# Patient Record
Sex: Female | Born: 1945 | Race: White | Hispanic: No | Smoking: Former smoker
Health system: Southern US, Community
[De-identification: ages and names within clinical notes are randomized; demographics above are authoritative.]

## PROBLEM LIST (undated history)

## (undated) DIAGNOSIS — F419 Anxiety disorder, unspecified: Secondary | ICD-10-CM

## (undated) DIAGNOSIS — R0602 Shortness of breath: Secondary | ICD-10-CM

## (undated) DIAGNOSIS — E559 Vitamin D deficiency, unspecified: Secondary | ICD-10-CM

## (undated) DIAGNOSIS — M1990 Unspecified osteoarthritis, unspecified site: Secondary | ICD-10-CM

## (undated) DIAGNOSIS — E669 Obesity, unspecified: Secondary | ICD-10-CM

## (undated) DIAGNOSIS — E039 Hypothyroidism, unspecified: Secondary | ICD-10-CM

## (undated) DIAGNOSIS — E739 Lactose intolerance, unspecified: Secondary | ICD-10-CM

## (undated) DIAGNOSIS — M549 Dorsalgia, unspecified: Secondary | ICD-10-CM

## (undated) DIAGNOSIS — A609 Anogenital herpesviral infection, unspecified: Secondary | ICD-10-CM

## (undated) DIAGNOSIS — M2550 Pain in unspecified joint: Secondary | ICD-10-CM

## (undated) DIAGNOSIS — M8430XA Stress fracture, unspecified site, initial encounter for fracture: Secondary | ICD-10-CM

## (undated) DIAGNOSIS — K829 Disease of gallbladder, unspecified: Secondary | ICD-10-CM

## (undated) DIAGNOSIS — R7303 Prediabetes: Secondary | ICD-10-CM

## (undated) DIAGNOSIS — E7800 Pure hypercholesterolemia, unspecified: Secondary | ICD-10-CM

## (undated) DIAGNOSIS — F32A Depression, unspecified: Secondary | ICD-10-CM

## (undated) DIAGNOSIS — R12 Heartburn: Secondary | ICD-10-CM

## (undated) DIAGNOSIS — I10 Essential (primary) hypertension: Secondary | ICD-10-CM

## (undated) DIAGNOSIS — L100 Pemphigus vulgaris: Secondary | ICD-10-CM

## (undated) DIAGNOSIS — C801 Malignant (primary) neoplasm, unspecified: Secondary | ICD-10-CM

## (undated) DIAGNOSIS — K589 Irritable bowel syndrome without diarrhea: Secondary | ICD-10-CM

## (undated) DIAGNOSIS — F329 Major depressive disorder, single episode, unspecified: Secondary | ICD-10-CM

## (undated) DIAGNOSIS — D649 Anemia, unspecified: Secondary | ICD-10-CM

## (undated) HISTORY — DX: Dorsalgia, unspecified: M54.9

## (undated) HISTORY — PX: OOPHORECTOMY: SHX86

## (undated) HISTORY — DX: Prediabetes: R73.03

## (undated) HISTORY — DX: Anogenital herpesviral infection, unspecified: A60.9

## (undated) HISTORY — DX: Pain in unspecified joint: M25.50

## (undated) HISTORY — DX: Depression, unspecified: F32.A

## (undated) HISTORY — DX: Shortness of breath: R06.02

## (undated) HISTORY — DX: Anemia, unspecified: D64.9

## (undated) HISTORY — DX: Unspecified osteoarthritis, unspecified site: M19.90

## (undated) HISTORY — DX: Malignant (primary) neoplasm, unspecified: C80.1

## (undated) HISTORY — DX: Major depressive disorder, single episode, unspecified: F32.9

## (undated) HISTORY — DX: Essential (primary) hypertension: I10

## (undated) HISTORY — DX: Obesity, unspecified: E66.9

## (undated) HISTORY — DX: Pemphigus vulgaris: L10.0

## (undated) HISTORY — PX: CATARACT EXTRACTION, BILATERAL: SHX1313

## (undated) HISTORY — DX: Stress fracture, unspecified site, initial encounter for fracture: M84.30XA

## (undated) HISTORY — PX: DILATION AND CURETTAGE OF UTERUS: SHX78

## (undated) HISTORY — DX: Lactose intolerance, unspecified: E73.9

## (undated) HISTORY — DX: Hypothyroidism, unspecified: E03.9

## (undated) HISTORY — DX: Heartburn: R12

## (undated) HISTORY — DX: Disease of gallbladder, unspecified: K82.9

## (undated) HISTORY — DX: Irritable bowel syndrome, unspecified: K58.9

## (undated) HISTORY — DX: Anxiety disorder, unspecified: F41.9

## (undated) HISTORY — DX: Pure hypercholesterolemia, unspecified: E78.00

## (undated) HISTORY — DX: Vitamin D deficiency, unspecified: E55.9

---
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
## 1974-02-16 HISTORY — PX: CHOLECYSTECTOMY: SHX55

## 1997-09-07 ENCOUNTER — Other Ambulatory Visit: Admission: RE | Admit: 1997-09-07 | Discharge: 1997-09-07 | Payer: Self-pay | Admitting: Obstetrics and Gynecology

## 1998-02-05 ENCOUNTER — Other Ambulatory Visit: Admission: RE | Admit: 1998-02-05 | Discharge: 1998-02-05 | Payer: Self-pay | Admitting: Obstetrics and Gynecology

## 1998-09-24 ENCOUNTER — Other Ambulatory Visit: Admission: RE | Admit: 1998-09-24 | Discharge: 1998-09-24 | Payer: Self-pay | Admitting: Obstetrics and Gynecology

## 1999-02-05 ENCOUNTER — Encounter (INDEPENDENT_AMBULATORY_CARE_PROVIDER_SITE_OTHER): Payer: Self-pay

## 1999-02-05 ENCOUNTER — Other Ambulatory Visit: Admission: RE | Admit: 1999-02-05 | Discharge: 1999-02-05 | Payer: Self-pay | Admitting: Obstetrics and Gynecology

## 1999-02-17 HISTORY — PX: ABDOMINAL HYSTERECTOMY: SHX81

## 1999-04-01 ENCOUNTER — Encounter (INDEPENDENT_AMBULATORY_CARE_PROVIDER_SITE_OTHER): Payer: Self-pay

## 1999-04-01 ENCOUNTER — Inpatient Hospital Stay (HOSPITAL_COMMUNITY): Admission: RE | Admit: 1999-04-01 | Discharge: 1999-04-03 | Payer: Self-pay | Admitting: Obstetrics and Gynecology

## 2000-04-30 ENCOUNTER — Other Ambulatory Visit: Admission: RE | Admit: 2000-04-30 | Discharge: 2000-04-30 | Payer: Self-pay | Admitting: Obstetrics and Gynecology

## 2001-05-02 ENCOUNTER — Other Ambulatory Visit: Admission: RE | Admit: 2001-05-02 | Discharge: 2001-05-02 | Payer: Self-pay | Admitting: Obstetrics and Gynecology

## 2002-05-05 ENCOUNTER — Other Ambulatory Visit: Admission: RE | Admit: 2002-05-05 | Discharge: 2002-05-05 | Payer: Self-pay | Admitting: Obstetrics and Gynecology

## 2003-05-24 ENCOUNTER — Other Ambulatory Visit: Admission: RE | Admit: 2003-05-24 | Discharge: 2003-05-24 | Payer: Self-pay | Admitting: Obstetrics and Gynecology

## 2004-01-15 ENCOUNTER — Ambulatory Visit: Payer: Self-pay | Admitting: Family Medicine

## 2004-05-26 ENCOUNTER — Other Ambulatory Visit: Admission: RE | Admit: 2004-05-26 | Discharge: 2004-05-26 | Payer: Self-pay | Admitting: Obstetrics and Gynecology

## 2005-02-19 ENCOUNTER — Ambulatory Visit: Payer: Self-pay | Admitting: Family Medicine

## 2005-06-09 ENCOUNTER — Other Ambulatory Visit: Admission: RE | Admit: 2005-06-09 | Discharge: 2005-06-09 | Payer: Self-pay | Admitting: Obstetrics and Gynecology

## 2006-06-29 ENCOUNTER — Other Ambulatory Visit: Admission: RE | Admit: 2006-06-29 | Discharge: 2006-06-29 | Payer: Self-pay | Admitting: Obstetrics and Gynecology

## 2006-07-20 ENCOUNTER — Ambulatory Visit: Payer: Self-pay | Admitting: Family Medicine

## 2007-06-13 ENCOUNTER — Encounter: Payer: Self-pay | Admitting: Family Medicine

## 2007-06-30 ENCOUNTER — Other Ambulatory Visit: Admission: RE | Admit: 2007-06-30 | Discharge: 2007-06-30 | Payer: Self-pay | Admitting: Obstetrics and Gynecology

## 2007-11-18 ENCOUNTER — Ambulatory Visit: Payer: Self-pay | Admitting: Family Medicine

## 2008-05-25 ENCOUNTER — Ambulatory Visit: Payer: Self-pay | Admitting: Obstetrics and Gynecology

## 2008-07-04 ENCOUNTER — Ambulatory Visit: Payer: Self-pay | Admitting: Obstetrics and Gynecology

## 2008-07-04 ENCOUNTER — Other Ambulatory Visit: Admission: RE | Admit: 2008-07-04 | Discharge: 2008-07-04 | Payer: Self-pay | Admitting: Obstetrics and Gynecology

## 2008-07-04 ENCOUNTER — Encounter: Payer: Self-pay | Admitting: Obstetrics and Gynecology

## 2008-09-04 ENCOUNTER — Encounter: Payer: Self-pay | Admitting: Family Medicine

## 2009-03-18 ENCOUNTER — Ambulatory Visit: Payer: Self-pay | Admitting: Obstetrics and Gynecology

## 2009-03-26 ENCOUNTER — Ambulatory Visit: Payer: Self-pay | Admitting: Family Medicine

## 2009-03-26 DIAGNOSIS — F341 Dysthymic disorder: Secondary | ICD-10-CM

## 2009-03-26 DIAGNOSIS — M62838 Other muscle spasm: Secondary | ICD-10-CM | POA: Insufficient documentation

## 2009-03-26 DIAGNOSIS — IMO0001 Reserved for inherently not codable concepts without codable children: Secondary | ICD-10-CM | POA: Insufficient documentation

## 2009-03-26 DIAGNOSIS — Z8719 Personal history of other diseases of the digestive system: Secondary | ICD-10-CM | POA: Insufficient documentation

## 2009-04-03 ENCOUNTER — Telehealth: Payer: Self-pay | Admitting: Family Medicine

## 2009-05-28 ENCOUNTER — Ambulatory Visit: Payer: Self-pay | Admitting: Family Medicine

## 2009-05-28 DIAGNOSIS — R109 Unspecified abdominal pain: Secondary | ICD-10-CM

## 2009-07-09 ENCOUNTER — Ambulatory Visit: Payer: Self-pay | Admitting: Obstetrics and Gynecology

## 2009-07-09 ENCOUNTER — Other Ambulatory Visit: Admission: RE | Admit: 2009-07-09 | Discharge: 2009-07-09 | Payer: Self-pay | Admitting: Obstetrics and Gynecology

## 2009-08-13 ENCOUNTER — Ambulatory Visit: Payer: Self-pay | Admitting: Obstetrics and Gynecology

## 2009-10-09 ENCOUNTER — Encounter: Payer: Self-pay | Admitting: Family Medicine

## 2010-03-20 NOTE — Assessment & Plan Note (Signed)
Summary: continued muscle pain/dm   Vital Signs:  Patient profile:   65 year old female Weight:      170 pounds O2 Sat:      97 % Temp:     97 degrees F oral Pulse rate:   94 / minute Pulse rhythm:   regular BP sitting:   142 / 78  Vitals Entered By: Lynann Beaver CMA (May 28, 2009 9:52 AM) CC: continued low abdominal pain Is Patient Diabetic? No Pain Assessment Patient in pain? yes     Location: abdomen   History of Present Illness: This 65 year old white female was seen in February for low abdominal pain which was thought to be muscle spasm but her lower spine and 2 Flexeril pain continues to be present in his diarrhea by standing and walking and also coughing aggravates ears is been no improvement As stated previously she had been seen by Her gynecologist as well as urologist and I can find no abnormalities to explain the Irritable bowel syndrome symptoms have improved with Levbid, do to Flexeril might have her sleepy she has not been able to take this as prescribed Anxiety depression and then much improved with Celexa alprazolam. The gynecologist has continued her Premarin              Current Medications (verified): 1)  Premarin 0.45 Mg Tabs (Estrogens Conjugated) .... Take 1 Tablet By Mouth Once A Day 2)  Levbid 0.375 Mg  Tb12 (Hyoscyamine Sulfate) .... Once Daily 3)  Mobic 7.5 Mg  Tabs (Meloxicam) .... As Needed  1 Once Daily For Inflammation 4)  Metamucil 0.52 Gm  Caps (Psyllium) .... Once Daily 5)  Celexa 20 Mg  Tabs (Citalopram Hydrobromide) .... Once Daily 6)  Alprazolam 0.25 Mg  Tabs (Alprazolam) .... As Needed 7)  Calcium Citrate With Vit D 630-400 .... Two Times A Day 8)  Vitamin D 1000 Unit Tabs (Cholecalciferol) .... Once Daily 9)  Lac-Hydrin Twelve 12 % Lotn (Ammonium Lactate) .... Use Daily 10)  Hyomax 0.125 Mg Tabs (Hyoscyamine Sulfate) .... As Needed Ibs 11)  Methocarbamol 500 Mg Tabs (Methocarbamol) .... As Needed Back 12)  Tylenol 325 Mg  Tabs (Acetaminophen) .... As Needed 13)  Flexeril 10 Mg Tabs (Cyclobenzaprine Hcl) .Marland Kitchen..  Midafternoon and Bedtime For Muscle Spasm 14)  Hydromet 5-1.5 Mg/70ml Syrp (Hydrocodone-Homatropine) .Marland Kitchen.. 1-2 Tsp Every 4-6 Hrs As Needed Cough  Allergies (verified): 1)  ! Codeine Sulfate (Codeine Sulfate) 2)  ! Sulfa 3)  ! Morphine 4)  ! * Diflucan 5)  ! * Nizerel  Review of Systems      See HPI  The patient denies anorexia, fever, weight loss, weight gain, vision loss, decreased hearing, hoarseness, chest pain, syncope, dyspnea on exertion, peripheral edema, prolonged cough, headaches, hemoptysis, abdominal pain, melena, hematochezia, severe indigestion/heartburn, hematuria, incontinence, genital sores, muscle weakness, suspicious skin lesions, transient blindness, difficulty walking, depression, unusual weight change, abnormal bleeding, enlarged lymph nodes, angioedema, breast masses, and testicular masses.    Physical Exam  General:  Well-developed,well-nourished,in no acute distress; alert,appropriate and cooperative throughout examination Lungs:  Normal respiratory effort, chest expands symmetrically. Lungs are clear to auscultation, no crackles or wheezes. Heart:  Normal rate and regular rhythm. S1 and S2 normal without gallop, murmur, click, rub or other extra sounds. Abdomen:  muscle spasm of the lower rectus muscles no masses palpable normal bowel sounds   Impression & Recommendations:  Problem # 1:  ABDOMINAL PAIN, LOWER (ICD-789.09) Assessment Deteriorated  Her updated medication list  for this problem includes:    Mobic 7.5 Mg Tabs (Meloxicam) .Marland Kitchen... As needed  1 once daily for inflammation    Methocarbamol 500 Mg Tabs (Methocarbamol) .Marland Kitchen... 1 qid for muscle spasm    Tylenol 325 Mg Tabs (Acetaminophen) .Marland Kitchen... As needed      Problem # 2:  MUSCLE SPASM (ICD-728.85) Assessment: Unchanged  Problem # 3:  POSTMENOPAUSAL ON HORMONE REPLACEMENT THERAPY (ICD-V07.4) Assessment:  Unchanged  Problem # 4:  ANXIETY DEPRESSION (ICD-300.4) Assessment: Improved  Problem # 5:  IRRITABLE BOWEL SYNDROME, HX OF (ICD-V12.79) Assessment: Improved  Complete Medication List: 1)  Premarin 0.45 Mg Tabs (Estrogens conjugated) .... Take 1 tablet by mouth once a day 2)  Levbid 0.375 Mg Tb12 (Hyoscyamine sulfate) .... Once daily 3)  Mobic 7.5 Mg Tabs (Meloxicam) .... As needed  1 once daily for inflammation 4)  Metamucil 0.52 Gm Caps (Psyllium) .... Once daily 5)  Celexa 20 Mg Tabs (Citalopram hydrobromide) .... Once daily 6)  Alprazolam 0.25 Mg Tabs (Alprazolam) .... As needed 7)  Calcium Citrate With Vit D 630-400  .... Two times a day 8)  Vitamin D 1000 Unit Tabs (Cholecalciferol) .... Once daily 9)  Lac-hydrin Twelve 12 % Lotn (Ammonium lactate) .... Use daily 10)  Hyomax 0.125 Mg Tabs (Hyoscyamine sulfate) .... As needed ibs 11)  Methocarbamol 500 Mg Tabs (Methocarbamol) .Marland Kitchen.. 1 qid for muscle spasm 12)  Tylenol 325 Mg Tabs (Acetaminophen) .... As needed 13)  Flexeril 10 Mg Tabs (Cyclobenzaprine hcl) .Marland Kitchen..  midafternoon and bedtime for muscle spasm 14)  Hydromet 5-1.5 Mg/63ml Syrp (Hydrocodone-homatropine) .Marland Kitchen.. 1-2 tsp every 4-6 hrs as needed cough  Patient Instructions: 1)  it is deferred difficult to explain your suprapubic pain but does appear to be muscle spasm since she has not responded to treatment I am going to change her muscle accident however I am going to schedule a CT scan of the abdomen Prescriptions: METHOCARBAMOL 500 MG TABS (METHOCARBAMOL) 1 qid for muscle spasm  #120 x 11   Entered and Authorized by:   Judithann Sheen MD   Signed by:   Judithann Sheen MD on 05/28/2009   Method used:   Electronically to        [facility]* (retail)       [address]       [place], [place]  [address]       Ph: [phone]       Fax: [phone]   RxID:   [id number]

## 2010-03-20 NOTE — Assessment & Plan Note (Signed)
Summary: PULLED MUSCLE LOWER ABD/PS   Vital Signs:  Patient profile:   65 year old female Weight:      175 pounds O2 Sat:      99 % on Room air Temp:     98.1 degrees F oral Pulse rate:   66 / minute BP sitting:   120 / 86  (left arm)  Vitals Entered By: Gladis Riffle, RN (March 26, 2009 9:19 AM)  O2 Flow:  Room air CC: "pulled muscle" of lower abdomen x 4 weeks Is Patient Diabetic? No Comments onset was slow, but is getting worse--ran out ou methocarbamol so used husband's skelaxin 800mg  1/2   History of Present Illness: this 65 year old white married female is complaining of pain over the abdomen pending to the suprapubic region. It is aggravated by walking standing or lifting she has seen the urologist, note positive findings nor diagnosis as well as examined and seen by Dr. Chales Abrahams leg and her gynecologist urinalysis has been done x2 and were negative pain is not related to bowel movements, not related to eating little and as stated above. The pain has been helped somewhat with Tylenol at times could not completely patient has history of irritable bowel syndrome and controlled with, Levbid but this pain is not same as when she has IBS the pain has been present for 4-5 weeks No other complaints, should mention no fever associated with the pain  Preventive Screening-Counseling & Management  Alcohol-Tobacco     Smoking Status: quit > 6 months  Current Medications (verified): 1)  Premarin 0.45 Mg Tabs (Estrogens Conjugated) .... Take 1 Tablet By Mouth Once A Day 2)  Levbid 0.375 Mg  Tb12 (Hyoscyamine Sulfate) .... Once Daily 3)  Mobic 7.5 Mg  Tabs (Meloxicam) .... As Needed 4)  Metamucil 0.52 Gm  Caps (Psyllium) .... Once Daily 5)  Celexa 20 Mg  Tabs (Citalopram Hydrobromide) .... Once Daily 6)  Alprazolam 0.25 Mg  Tabs (Alprazolam) .... As Needed 7)  Calcium Citrate With Vit D 630-400 .... Two Times A Day 8)  Vitamin D 1000 Unit Tabs (Cholecalciferol) .... Once Daily 9)   Lac-Hydrin Twelve 12 % Lotn (Ammonium Lactate) .... Use Daily 10)  Hyomax 0.125 Mg Tabs (Hyoscyamine Sulfate) .... As Needed Ibs 11)  Methocarbamol 500 Mg Tabs (Methocarbamol) .... As Needed Back 12)  Tylenol 325 Mg Tabs (Acetaminophen) .... As Needed  Allergies: 1)  ! Codeine Sulfate (Codeine Sulfate) 2)  ! Sulfa 3)  ! Morphine 4)  ! * Diflucan 5)  ! Riki Sheer  Past History:  Past Surgical History: Last updated: 10/25/2006 Cholecystectomy  1976 Hysterectomy  2001 Tonsillectomy, adenoidectomy  1954  Risk Factors: Smoking Status: quit > 6 months (03/26/2009)  Social History: Smoking Status:  quit > 6 months  Review of Systems  The patient denies anorexia, fever, weight loss, weight gain, vision loss, decreased hearing, hoarseness, chest pain, syncope, dyspnea on exertion, peripheral edema, prolonged cough, headaches, hemoptysis, abdominal pain, melena, hematochezia, severe indigestion/heartburn, hematuria, incontinence, genital sores, muscle weakness, suspicious skin lesions, transient blindness, difficulty walking, depression, unusual weight change, abnormal bleeding, enlarged lymph nodes, angioedema, breast masses, and testicular masses.    Physical Exam  General:  Well-developed,well-nourished,in no acute distress; alert,appropriate and cooperative throughout examination Lungs:  Normal respiratory effort, chest expands symmetrically. Lungs are clear to auscultation, no crackles or wheezes. Heart:  Normal rate and regular rhythm. S1 and S2 normal without gallop, murmur, click, rub or other extra sounds. Abdomen:  muscle spasm  of the rectus muscles bilaterally greater on the left and very tender on insertion of the pubis, bowel sounds are normal no masses felt liver spleen kidneys are normal in size and shape and not enlarged, bowel sounds normal Rectal:  none exam   Impression & Recommendations:  Problem # 1:  MYOSITIS (ICD-729.1) Assessment New  Her updated medication  list for this problem includes:    Mobic 7.5 Mg Tabs (Meloxicam) .Marland Kitchen... As needed  1 once daily for inflammation    Methocarbamol 500 Mg Tabs (Methocarbamol) .Marland Kitchen... As needed back    Tylenol 325 Mg Tabs (Acetaminophen) .Marland Kitchen... As needed    Flexeril 10 Mg Tabs (Cyclobenzaprine hcl) .Marland KitchenMarland KitchenMarland KitchenMarland Kitchen  midafternoon and bedtime for muscle spasm  Orders: Depo- Medrol 80mg  (J1040) Admin of Therapeutic Inj  intramuscular or subcutaneous (95621)  Problem # 2:  MUSCLE SPASM (ICD-728.85) Assessment: New  Flexeril 10 mg t.i.d.  Orders: Prescription Created Electronically [phone])  Problem # 3:  POSTMENOPAUSAL ON HORMONE REPLACEMENT THERAPY (ICD-V07.4) Assessment: Improved Premarin O. 0.5 mg q. day  Problem # 4:  ANXIETY DEPRESSION (ICD-300.4) Assessment: Improved Celexa 20 mg q. day also alprazolam oh 0.25 mg t.i.d. as needed  Problem # 5:  IRRITABLE BOWEL SYNDROME, HX OF (ICD-V12.79) Assessment: Improved  Complete Medication List: 1)  Premarin 0.45 Mg Tabs (Estrogens conjugated) .... Take 1 tablet by mouth once a day 2)  Levbid 0.375 Mg Tb12 (Hyoscyamine sulfate) .... Once daily 3)  Mobic 7.5 Mg Tabs (Meloxicam) .... As needed  1 once daily for inflammation 4)  Metamucil 0.52 Gm Caps (Psyllium) .... Once daily 5)  Celexa 20 Mg Tabs (Citalopram hydrobromide) .... Once daily 6)  Alprazolam 0.25 Mg Tabs (Alprazolam) .... As needed 7)  Calcium Citrate With Vit D 630-400  .... Two times a day 8)  Vitamin D 1000 Unit Tabs (Cholecalciferol) .... Once daily 9)  Lac-hydrin Twelve 12 % Lotn (Ammonium lactate) .... Use daily 10)  Hyomax 0.125 Mg Tabs (Hyoscyamine sulfate) .... As needed ibs 11)  Methocarbamol 500 Mg Tabs (Methocarbamol) .... As needed back 12)  Tylenol 325 Mg Tabs (Acetaminophen) .... As needed 13)  Flexeril 10 Mg Tabs (Cyclobenzaprine hcl) .Marland Kitchen..  midafternoon and bedtime for muscle spasm  Patient Instructions: 1)  Myositis and spasm of rectus muscles of abdomen, left greater than  rt 2)  Flexeril as muscle relaxant 3)  Mobic and depomedrol as antiinflammatory medicines 4)  Apply heat 20-30 mintes when possible 5)  Avoid exercises in gym that cause pain 6)  No lifting heavy objects 7)  When getting into car, sit down and swing your legs and feet around to enter Prescriptions: MOBIC 7.5 MG  TABS (MELOXICAM) as needed  1 once daily for inflammation  #30 x 11   Entered and Authorized by:   Judithann Sheen MD   Signed by:   Judithann Sheen MD on 03/26/2009   Method used:   Electronically to        [facility]* (retail)       [address]       [place], [place]  [address]       Ph: [phone]       Fax: [phone]   RxID:   [id number] FLEXERIL 10 MG TABS (CYCLOBENZAPRINE HCL)  midafternoon and bedtime for muscle spasm  #90 x 5   Entered and Authorized by:   Judithann Sheen MD   Signed by:   Valarie Merino  Montez Hageman MD on 03/26/2009   Method used:   Electronically to        [facility]* (retail)       [address]       [place], [place]  [id number]       Ph: [phone]       Fax: [phone]   RxID:   [id number]    Medication Administration  Injection # 1:    Medication: Depo- Medrol 80mg     Diagnosis: MYOSITIS (ICD-729.1)    Route: IM    Site: RUOQ gluteus    Exp Date: 12/18/2011    Lot #: [id number]    Mfr: Pharmacia    Comments: 120mg  given    Patient tolerated injection without complications    Given by: Gladis Riffle, RN (March 26, 2009 11:28 AM)  Orders Added: 1)  Depo- Medrol 80mg  [J1040] 2)  Admin of Therapeutic Inj  intramuscular or subcutaneous [96372] 3)  Prescription Created Electronically [G8553] 4)  Est. Patient Level IV [66440]

## 2010-03-20 NOTE — Letter (Signed)
[facility]: [facility]   Imported By: Maryln Gottron 11/05/2009 12:12:01  _____________________________________________________________________  External Attachment:    Type:   Image     Comment:   External Document

## 2010-03-20 NOTE — Progress Notes (Signed)
Summary: cough  Phone Note Call from Patient   Caller: Patient Call For: Brianna Sheen MD Summary of Call: Pt is calling to ask Dr. Scotty Court for RX for dry cough.  Is taking Mucinex.  Cannot take Codeine. [place] [phone] No fever. Initial call taken by: Lynann Beaver CMA,  April 03, 2009 8:36 AM  Follow-up for Phone Call        called by dr Scotty Court  and hydrocodone homatripine called in  Follow-up by: Pura Spice, RN,  April 03, 2009 9:48 AM    New/Updated Medications: HYDROMET 5-1.5 MG/5ML SYRP (HYDROCODONE-HOMATROPINE) 1-2 tsp every 4-6 hrs as needed cough Prescriptions: HYDROMET 5-1.5 MG/5ML SYRP (HYDROCODONE-HOMATROPINE) 1-2 tsp every 4-6 hrs as needed cough  #240 x 0   Entered by:   Pura Spice, RN   Authorized by:   Brianna Sheen MD   Signed by:   Pura Spice, RN on 04/03/2009   Method used:   Telephoned to ...       [facility]* (retail)       [address]       [place], [place]  [address]       Ph: [phone]       Fax: [phone]   RxID:   [id number]

## 2010-05-13 ENCOUNTER — Other Ambulatory Visit: Payer: Self-pay | Admitting: Family Medicine

## 2010-07-22 ENCOUNTER — Encounter: Payer: Self-pay | Admitting: Obstetrics and Gynecology

## 2010-07-30 ENCOUNTER — Other Ambulatory Visit (HOSPITAL_COMMUNITY)
Admission: RE | Admit: 2010-07-30 | Discharge: 2010-07-30 | Disposition: A | Discharge status: Home / Self Care | Payer: Commercial Managed Care - PPO | Source: Ambulatory Visit | Attending: Obstetrics and Gynecology | Admitting: Obstetrics and Gynecology

## 2010-07-30 ENCOUNTER — Other Ambulatory Visit: Payer: Self-pay | Admitting: Obstetrics and Gynecology

## 2010-07-30 ENCOUNTER — Encounter (INDEPENDENT_AMBULATORY_CARE_PROVIDER_SITE_OTHER): Payer: Commercial Managed Care - PPO | Admitting: Obstetrics and Gynecology

## 2010-07-30 DIAGNOSIS — Z833 Family history of diabetes mellitus: Secondary | ICD-10-CM

## 2010-07-30 DIAGNOSIS — Z01419 Encounter for gynecological examination (general) (routine) without abnormal findings: Secondary | ICD-10-CM

## 2010-07-30 DIAGNOSIS — Z13220 Encounter for screening for lipoid disorders: Secondary | ICD-10-CM

## 2010-07-30 DIAGNOSIS — Z124 Encounter for screening for malignant neoplasm of cervix: Secondary | ICD-10-CM | POA: Insufficient documentation

## 2010-12-15 ENCOUNTER — Ambulatory Visit (INDEPENDENT_AMBULATORY_CARE_PROVIDER_SITE_OTHER): Payer: Commercial Managed Care - PPO

## 2010-12-15 DIAGNOSIS — Z23 Encounter for immunization: Secondary | ICD-10-CM

## 2011-02-20 ENCOUNTER — Ambulatory Visit: Payer: Commercial Managed Care - PPO | Admitting: Internal Medicine

## 2011-04-02 HISTORY — PX: HAND SURGERY: SHX662

## 2011-05-27 ENCOUNTER — Ambulatory Visit (INDEPENDENT_AMBULATORY_CARE_PROVIDER_SITE_OTHER): Payer: Commercial Managed Care - PPO | Admitting: Internal Medicine

## 2011-05-27 ENCOUNTER — Encounter: Payer: Self-pay | Admitting: Internal Medicine

## 2011-05-27 VITALS — BP 120/80 | HR 70 | Temp 98.3°F | Resp 18 | Ht 63.0 in | Wt 174.0 lb

## 2011-05-27 DIAGNOSIS — F341 Dysthymic disorder: Secondary | ICD-10-CM

## 2011-05-27 DIAGNOSIS — Z0000 Encounter for general adult medical examination without abnormal findings: Secondary | ICD-10-CM

## 2011-05-27 DIAGNOSIS — Z8719 Personal history of other diseases of the digestive system: Secondary | ICD-10-CM

## 2011-05-27 LAB — CBC WITH DIFFERENTIAL/PLATELET
Basophils Relative: 1.1 % (ref 0.0–3.0)
Eosinophils Relative: 4.1 % (ref 0.0–5.0)
Hemoglobin: 12.8 g/dL (ref 12.0–15.0)
Lymphocytes Relative: 30.4 % (ref 12.0–46.0)
MCHC: 32 g/dL (ref 30.0–36.0)
Monocytes Relative: 8.3 % (ref 3.0–12.0)
Neutro Abs: 3.5 10*3/uL (ref 1.4–7.7)
RBC: 4.31 Mil/uL (ref 3.87–5.11)
WBC: 6.2 10*3/uL (ref 4.5–10.5)

## 2011-05-27 LAB — COMPREHENSIVE METABOLIC PANEL
AST: 18 U/L (ref 0–37)
BUN: 15 mg/dL (ref 6–23)
Calcium: 8.7 mg/dL (ref 8.4–10.5)
Chloride: 107 mEq/L (ref 96–112)
Creatinine, Ser: 0.6 mg/dL (ref 0.4–1.2)

## 2011-05-27 LAB — TSH: TSH: 3.14 u[IU]/mL (ref 0.35–5.50)

## 2011-05-27 LAB — LIPID PANEL
Cholesterol: 217 mg/dL — ABNORMAL HIGH (ref 0–200)
HDL: 80.9 mg/dL (ref >39.00)
VLDL: 14 mg/dL (ref 0.0–40.0)

## 2011-05-27 MED ORDER — ALPRAZOLAM 0.25 MG PO TABS: 0.2500 mg | ORAL_TABLET | Freq: Every evening | ORAL | Status: DC | PRN

## 2011-05-27 MED ORDER — CITALOPRAM HYDROBROMIDE 20 MG PO TABS: 20.0000 mg | ORAL_TABLET | Freq: Every day | ORAL | Status: DC

## 2011-05-27 MED ORDER — MELOXICAM 7.5 MG PO TABS: 7.5000 mg | ORAL_TABLET | Freq: Every day | ORAL | Status: DC

## 2011-05-27 NOTE — Patient Instructions (Signed)
It is important that you exercise regularly, at least 20 minutes 3 to 4 times per week.  If you develop chest pain or shortness of breath seek  medical attention.  Limit your sodium (Salt) intake  Call or return to clinic prn if these symptoms worsen or fail to improve as anticipated.  

## 2011-05-27 NOTE — Progress Notes (Signed)
  Subjective:    Patient ID: Brianna Mack, female    DOB: 1945-05-18, 66 y.o.   MRN: [id number]  HPI  66 year old patient who is seen today for a health maintenance examination. She is followed by gynecology. She is a history of IBS and is also followed by Dr. Kinnie Scales. She is scheduled for a followup colonoscopy in about one year. Her IBS has been fairly stable. She does have a history of mild anxiety depression which has been well-controlled with citalopram. She also takes when necessary alprazolam. No concerns or complaints. No recent lab    Review of Systems  Constitutional: Negative.   HENT: Negative for hearing loss, congestion, sore throat, rhinorrhea, dental problem, sinus pressure and tinnitus.   Eyes: Negative for pain, discharge and visual disturbance.  Respiratory: Negative for cough and shortness of breath.   Cardiovascular: Negative for chest pain, palpitations and leg swelling.  Gastrointestinal: Negative for nausea, vomiting, abdominal pain, diarrhea, constipation, blood in stool and abdominal distention.  Genitourinary: Negative for dysuria, urgency, frequency, hematuria, flank pain, vaginal bleeding, vaginal discharge, difficulty urinating, vaginal pain and pelvic pain.  Musculoskeletal: Negative for joint swelling, arthralgias and gait problem.  Skin: Negative for rash.  Neurological: Negative for dizziness, syncope, speech difficulty, weakness, numbness and headaches.  Hematological: Negative for adenopathy.  Psychiatric/Behavioral: Negative for behavioral problems, dysphoric mood and agitation. The patient is not nervous/anxious.        Objective:   Physical Exam  Constitutional: She is oriented to person, place, and time. She appears well-developed and well-nourished.  HENT:  Head: Normocephalic.  Right Ear: External ear normal.  Left Ear: External ear normal.  Mouth/Throat: Oropharynx is clear and moist.  Eyes: Conjunctivae and EOM are normal. Pupils are equal,  round, and reactive to light.  Neck: Normal range of motion. Neck supple. No thyromegaly present.  Cardiovascular: Normal rate, regular rhythm, normal heart sounds and intact distal pulses.   Pulmonary/Chest: Effort normal and breath sounds normal.  Abdominal: Soft. Bowel sounds are normal. She exhibits no mass. There is no tenderness.  Musculoskeletal: Normal range of motion.  Lymphadenopathy:    She has no cervical adenopathy.  Neurological: She is alert and oriented to person, place, and time.  Skin: Skin is warm and dry. No rash noted.  Psychiatric: She has a normal mood and affect. Her behavior is normal.          Assessment & Plan:   Preventive health exam History of IBS. Followup GI will refill Levbid. History of anxiety depression stable will refill citalopram  Screening lab Exercise modest weight loss encouraged

## 2011-08-04 ENCOUNTER — Ambulatory Visit (INDEPENDENT_AMBULATORY_CARE_PROVIDER_SITE_OTHER): Payer: Commercial Managed Care - PPO | Admitting: Obstetrics and Gynecology

## 2011-08-04 ENCOUNTER — Encounter: Payer: Self-pay | Admitting: Obstetrics and Gynecology

## 2011-08-04 VITALS — BP 128/80 | Ht 62.5 in | Wt 168.0 lb

## 2011-08-04 DIAGNOSIS — F341 Dysthymic disorder: Secondary | ICD-10-CM

## 2011-08-04 DIAGNOSIS — Z0000 Encounter for general adult medical examination without abnormal findings: Secondary | ICD-10-CM

## 2011-08-04 DIAGNOSIS — Z8719 Personal history of other diseases of the digestive system: Secondary | ICD-10-CM

## 2011-08-04 DIAGNOSIS — Z01419 Encounter for gynecological examination (general) (routine) without abnormal findings: Secondary | ICD-10-CM

## 2011-08-04 MED ORDER — CITALOPRAM HYDROBROMIDE 20 MG PO TABS: 20.0000 mg | ORAL_TABLET | Freq: Every day | ORAL | Status: DC

## 2011-08-04 MED ORDER — ALPRAZOLAM 0.25 MG PO TABS: 0.2500 mg | ORAL_TABLET | Freq: Every evening | ORAL | Status: DC | PRN

## 2011-08-04 MED ORDER — ESTROGENS CONJUGATED 0.45 MG PO TABS
0.4500 mg | ORAL_TABLET | Freq: Every day | ORAL | Status: DC
Start: 2011-08-04 — End: 2012-09-05

## 2011-08-04 NOTE — Patient Instructions (Signed)
Schedule mammogram.

## 2011-08-04 NOTE — Progress Notes (Signed)
Patient came to see me today for her annual GYN exam. She remains on oral Premarin with excellent results for vasomotor symptoms. She also remains on Celexa for depression with excellent results. She will occasionally take a Xanax for sleep. We are prescribing all the above. Her husband just had a stroke so she is under a lot of stress. She is having no vaginal bleeding. She is having no pelvic pain. She is due for a mammogram and she'll schedule it. She has had 2 normal bone densities. She has had a hysterectomy and has always had normal Pap smears.  HEENT: Within normal limits. Blanca present. Neck: No masses. Supraclavicular lymph nodes: Not enlarged. Breasts: Examined in both sitting and lying position. Symmetrical without skin changes or masses. Abdomen: Soft no masses guarding or rebound. No hernias. Pelvic: External within normal limits. BUS within normal limits. Vaginal examination shows good estrogen effect, no cystocele enterocele or rectocele. Cervix and uterus absent. Adnexa within normal limits. Rectovaginal confirmatory. Extremities within normal limits.  Assessment: #1. Vasomotor symptoms #2. Depression  Plan: Continue Premarin, Celexa, Xanax. Prescriptions written. Mammogram. Discussed transdermal estrogen to reduce risk of DVT and stroke. Patient declined.

## 2011-08-17 ENCOUNTER — Encounter: Payer: Self-pay | Admitting: Obstetrics and Gynecology

## 2011-11-02 ENCOUNTER — Telehealth: Payer: Self-pay | Admitting: *Deleted

## 2011-11-02 NOTE — Telephone Encounter (Signed)
Dr Wynn Banker ordered an [facility] for electrical stimulus for Brianna Mack's shoulder. She would like an rx for this sent to [facility]. States it is an "EMTI NP NMES unit for tissue atrophy"(?)  She was told the icd 9 code is 728.2

## 2011-11-02 NOTE — Telephone Encounter (Signed)
Error. Call is about her husband Everlean Alstrom by Fannie Knee and entered message in her chart.

## 2011-11-19 ENCOUNTER — Ambulatory Visit (INDEPENDENT_AMBULATORY_CARE_PROVIDER_SITE_OTHER): Payer: Medicare Other | Admitting: Internal Medicine

## 2011-11-19 DIAGNOSIS — Z23 Encounter for immunization: Secondary | ICD-10-CM

## 2011-11-20 ENCOUNTER — Telehealth: Payer: Self-pay | Admitting: *Deleted

## 2011-11-20 NOTE — Telephone Encounter (Signed)
(  pt aware you out of the office) Pt is currently taking premarin 0.45, pt insurance has changed to medicare and this medication will be $88.00. Pt asked if she could have another medication that maybe cheaper? Please advise

## 2011-11-20 NOTE — Telephone Encounter (Signed)
Estradiol .5 mgm daily.

## 2011-11-23 MED ORDER — ESTRADIOL 0.5 MG PO TABS: 0.5000 mg | ORAL_TABLET | Freq: Every day | ORAL | Status: DC

## 2011-11-23 NOTE — Addendum Note (Signed)
Addended by: Aura Camps on: 11/23/2011 08:35 AM   Modules accepted: Orders

## 2011-11-23 NOTE — Telephone Encounter (Signed)
Left the below on pt voicemail, rx sent 

## 2011-12-28 ENCOUNTER — Other Ambulatory Visit: Payer: Self-pay | Admitting: *Deleted

## 2011-12-28 ENCOUNTER — Encounter: Payer: Self-pay | Admitting: *Deleted

## 2011-12-28 NOTE — Progress Notes (Signed)
Patient ID: Brianna Mack, female   DOB: 30-Apr-1945, 66 y.o.   MRN: [id number] Pt estradiol 0.5 mg tablet approved through 12/24/12. Pharmacy informed as well.

## 2012-01-05 ENCOUNTER — Ambulatory Visit (INDEPENDENT_AMBULATORY_CARE_PROVIDER_SITE_OTHER): Payer: Medicare Other | Admitting: Internal Medicine

## 2012-01-05 ENCOUNTER — Encounter: Payer: Self-pay | Admitting: Internal Medicine

## 2012-01-05 VITALS — BP 122/80 | HR 64 | Temp 98.0°F | Resp 16 | Wt 168.0 lb

## 2012-01-05 DIAGNOSIS — J340 Abscess, furuncle and carbuncle of nose: Secondary | ICD-10-CM

## 2012-01-05 DIAGNOSIS — J3489 Other specified disorders of nose and nasal sinuses: Secondary | ICD-10-CM

## 2012-01-05 MED ORDER — DOXYCYCLINE HYCLATE 100 MG PO TABS: 100.0000 mg | ORAL_TABLET | Freq: Two times a day (BID) | ORAL | Status: DC

## 2012-01-05 NOTE — Patient Instructions (Signed)
Take your antibiotic as prescribed until ALL of it is gone, but stop if you develop a rash, swelling, or any side effects of the medication.  Contact our office as soon as possible if  there are side effects of the medication.  Warm compresses to the left nasal area 3-4 times daily

## 2012-01-05 NOTE — Progress Notes (Signed)
Subjective:    Patient ID: Brianna Mack, female    DOB: 1945-08-02, 66 y.o.   MRN: [id number]  HPI  66 year old patient who presents with a chief complaint of pain and swelling involving the left nasal area. She states that has typical for this time a year she has had some dryness involving the nasal mucosa. There is been some irritation and occasional bleeding from the left nares. Over the past few days she has noted some soft tissue swelling and pain involving left-sided her nose. She has difficulty with a number of antibiotics including cephalexin and Cipro. She also is allergic to sulfa antibiotics  Past Medical History  Diagnosis Date  . Endometriosis   . Fibroid   . Depression   . Anxiety   . IBS (irritable bowel syndrome)   . Arthritis     History   Social History  . Marital Status: Married    Spouse Name: N/A    Number of Children: N/A  . Years of Education: N/A   Occupational History  . Not on file.   Social History Main Topics  . Smoking status: Former Smoker    Quit date: 08/03/1977  . Smokeless tobacco: Never Used  . Alcohol Use: Yes     Comment: occasionally  . Drug Use: No  . Sexually Active: Not Currently   Other Topics Concern  . Not on file   Social History Narrative  . No narrative on file    Past Surgical History  Procedure Date  . Abdominal hysterectomy 2001    BSO ; SLING PROCEDURE  . Cholecystectomy 1976  . Tonsillectomy 1954  . Dilation and curettage of uterus   . Hand surgery 04-02-2011    right    Family History  Problem Relation Age of Onset  . Diabetes Mother   . Hypertension Mother   . Cancer Mother     Eda Keys  . COPD Father     Allergies  Allergen Reactions  . Cephalexin Diarrhea  . Codeine Sulfate     REACTION: nausea, vomiting  . Fluconazole     REACTION: swelling and itching  . Morphine     REACTION: nausea, itching  . Sulfonamide Derivatives     REACTION: rash    Current Outpatient Prescriptions on  File Prior to Visit  Medication Sig Dispense Refill  . Acetaminophen (TYLENOL ARTHRITIS PAIN PO) Take by mouth as needed.        . ALPRAZolam (XANAX) 0.25 MG tablet Take 1 tablet (0.25 mg total) by mouth at bedtime as needed.  30 tablet  3  . Calcium Carbonate-Vitamin D (CALCIUM + D PO) Take by mouth.        . citalopram (CELEXA) 20 MG tablet Take 1 tablet (20 mg total) by mouth daily.  30 tablet  12  . CycloSPORINE (RESTASIS OP) Apply to eye daily.      Marland Kitchen estradiol (ESTRACE) 0.5 MG tablet Take 1 tablet (0.5 mg total) by mouth daily.  30 tablet  11  . estrogens, conjugated, (PREMARIN) 0.45 MG tablet Take 1 tablet (0.45 mg total) by mouth daily.  30 tablet  12  . hyoscyamine (LEVBID) 0.375 MG 12 hr tablet Take 0.375 mg by mouth daily.       . Lactobacillus (ACIDOPHILUS PO) Take by mouth daily.      . Omega-3 Fatty Acids (FISH OIL PO) Take by mouth daily.      . meloxicam (MOBIC) 7.5 MG tablet Take 1 tablet (7.5 mg  total) by mouth daily.  90 tablet  3    BP 122/80  Pulse 64  Temp 98 F (36.7 C) (Oral)  Resp 16  Wt 168 lb (76.204 kg)       Review of Systems  Constitutional: Negative.   HENT: Negative for hearing loss, congestion, sore throat, rhinorrhea, dental problem, sinus pressure and tinnitus.   Eyes: Negative for pain, discharge and visual disturbance.  Respiratory: Negative for cough and shortness of breath.   Cardiovascular: Negative for chest pain, palpitations and leg swelling.  Gastrointestinal: Negative for nausea, vomiting, abdominal pain, diarrhea, constipation, blood in stool and abdominal distention.  Genitourinary: Negative for dysuria, urgency, frequency, hematuria, flank pain, vaginal bleeding, vaginal discharge, difficulty urinating, vaginal pain and pelvic pain.  Musculoskeletal: Negative for joint swelling, arthralgias and gait problem.  Skin: Positive for rash.  Neurological: Negative for dizziness, syncope, speech difficulty, weakness, numbness and headaches.   Hematological: Negative for adenopathy.  Psychiatric/Behavioral: Negative for behavioral problems, dysphoric mood and agitation. The patient is not nervous/anxious.        Objective:   Physical Exam  Constitutional: She appears well-developed and well-nourished.  HENT:  Right Ear: External ear normal.  Left Ear: External ear normal.  Mouth/Throat: Oropharynx is clear and moist.       The left the nasal area and maxillary region of the face were slightly tender with soft tissue swelling and mild erythema  Neck: Normal range of motion.          Assessment & Plan:   Early cellulitis nose. Will treat with warm compresses and doxycycline. Will call to is any clinical deterioration. Will treat with probiotics prophylactically.

## 2012-01-07 ENCOUNTER — Other Ambulatory Visit: Payer: Self-pay | Admitting: Dermatology

## 2012-01-07 ENCOUNTER — Telehealth: Payer: Self-pay | Admitting: Internal Medicine

## 2012-01-07 MED ORDER — DOXYCYCLINE HYCLATE 100 MG PO TABS: 100.0000 mg | ORAL_TABLET | Freq: Two times a day (BID) | ORAL | Status: DC

## 2012-01-07 NOTE — Telephone Encounter (Signed)
Caller: Irlene/Patient; Phone: [phone]; Reason for Call: Seen in the office Tuesday 11/19 and the prescription is not at the pharmacy, [facility] in [place]. In reviewing her chart, the script was sent electronically to [facility] in [place] as a mail order.  Would you please cancel the mail order and send to her local pharmacy?

## 2012-01-07 NOTE — Telephone Encounter (Signed)
Rx was sent to local pharmacy.   

## 2012-01-07 NOTE — Telephone Encounter (Signed)
Pt notified Rx was sent to local pharmacy.

## 2012-01-07 NOTE — Telephone Encounter (Signed)
I called patient to let her know that the script had been sent to her mail order pharmacy, [facility] but that we would get it corrected and sent to her local pharmacy, [facility].  She states that she has never used a [facility] and asked that it be removed please.  She only uses [facility].

## 2012-01-29 ENCOUNTER — Other Ambulatory Visit: Payer: Self-pay | Admitting: *Deleted

## 2012-01-29 DIAGNOSIS — F341 Dysthymic disorder: Secondary | ICD-10-CM

## 2012-01-29 DIAGNOSIS — Z0000 Encounter for general adult medical examination without abnormal findings: Secondary | ICD-10-CM

## 2012-01-29 DIAGNOSIS — Z8719 Personal history of other diseases of the digestive system: Secondary | ICD-10-CM

## 2012-01-29 MED ORDER — ALPRAZOLAM 0.25 MG PO TABS: 0.2500 mg | ORAL_TABLET | Freq: Every evening | ORAL | Status: DC | PRN

## 2012-01-29 NOTE — Telephone Encounter (Signed)
Done kw

## 2012-02-15 ENCOUNTER — Telehealth: Payer: Self-pay | Admitting: Internal Medicine

## 2012-02-15 DIAGNOSIS — F419 Anxiety disorder, unspecified: Secondary | ICD-10-CM

## 2012-02-15 NOTE — Telephone Encounter (Signed)
Please refer

## 2012-02-15 NOTE — Telephone Encounter (Signed)
Pt would like referral to psychologist /psychiatrist. (Dealing w/ husband's health issues).

## 2012-02-16 NOTE — Telephone Encounter (Signed)
Referral sent 

## 2012-02-25 ENCOUNTER — Ambulatory Visit (INDEPENDENT_AMBULATORY_CARE_PROVIDER_SITE_OTHER): Payer: 59 | Admitting: Licensed Clinical Social Worker

## 2012-02-25 DIAGNOSIS — F4323 Adjustment disorder with mixed anxiety and depressed mood: Secondary | ICD-10-CM

## 2012-06-06 ENCOUNTER — Ambulatory Visit (INDEPENDENT_AMBULATORY_CARE_PROVIDER_SITE_OTHER): Payer: Self-pay | Admitting: Internal Medicine

## 2012-06-06 ENCOUNTER — Encounter: Payer: Self-pay | Admitting: Internal Medicine

## 2012-06-06 VITALS — BP 120/80 | HR 66 | Temp 98.2°F | Resp 18 | Wt 172.0 lb

## 2012-06-06 DIAGNOSIS — H8110 Benign paroxysmal vertigo, unspecified ear: Secondary | ICD-10-CM

## 2012-06-06 DIAGNOSIS — F341 Dysthymic disorder: Secondary | ICD-10-CM

## 2012-06-06 MED ORDER — MECLIZINE HCL 25 MG PO CHEW: 1.0000 | CHEWABLE_TABLET | Freq: Four times a day (QID) | ORAL | Status: DC | PRN

## 2012-06-06 NOTE — Patient Instructions (Signed)
Call or return to clinic prn if these symptoms worsen or fail to improve as anticipated. Benign Positional Vertigo Vertigo means you feel like you or your surroundings are moving when they are not. Benign positional vertigo is the most common form of vertigo. Benign means that the cause of your condition is not serious. Benign positional vertigo is more common in older adults. CAUSES  Benign positional vertigo is the result of an upset in the labyrinth system. This is an area in the middle ear that helps control your balance. This may be caused by a viral infection, head injury, or repetitive motion. However, often no specific cause is found. SYMPTOMS  Symptoms of benign positional vertigo occur when you move your head or eyes in different directions. Some of the symptoms may include:  Loss of balance and falls.  Vomiting.  Blurred vision.  Dizziness.  Nausea.  Involuntary eye movements (nystagmus). DIAGNOSIS  Benign positional vertigo is usually diagnosed by physical exam. If the specific cause of your benign positional vertigo is unknown, your caregiver may perform imaging tests, such as magnetic resonance imaging (MRI) or computed tomography (CT). TREATMENT  Your caregiver may recommend movements or procedures to correct the benign positional vertigo. Medicines such as meclizine, benzodiazepines, and medicines for nausea may be used to treat your symptoms. In rare cases, if your symptoms are caused by certain conditions that affect the inner ear, you may need surgery. HOME CARE INSTRUCTIONS   Follow your caregiver's instructions.  Move slowly. Do not make sudden body or head movements.  Avoid driving.  Avoid operating heavy machinery.  Avoid performing any tasks that would be dangerous to you or others during a vertigo episode.  Drink enough fluids to keep your urine clear or pale yellow. SEEK IMMEDIATE MEDICAL CARE IF:   You develop problems with walking, weakness, numbness,  or using your arms, hands, or legs.  You have difficulty speaking.  You develop severe headaches.  Your nausea or vomiting continues or gets worse.  You develop visual changes.  Your family or friends notice any behavioral changes.  Your condition gets worse.  You have a fever.  You develop a stiff neck or sensitivity to light. MAKE SURE YOU:   Understand these instructions.  Will watch your condition.  Will get help right away if you are not doing well or get worse. Document Released: 11/10/2005 Document Revised: 04/27/2011 Document Reviewed: 10/23/2010 [facility] Patient Information 2013 [place], [place].

## 2012-06-06 NOTE — Progress Notes (Signed)
Subjective:    Patient ID: Brianna Mack, female    DOB: [birth year], 67 y.o.   MRN: [id number]  HPI  67 year old patient who presents with a three-week history of positional vertigo; this initially occurred when she bent over. Her last episode was 3 days ago. She describes some mild vertigo with a sense of disequilibrium. There have been no falls. No significant nausea or vomiting. Denies any other associated symptoms such as double vision or dysarthria.  Past Medical History  Diagnosis Date  . Endometriosis   . Fibroid   . Depression   . Anxiety   . IBS (irritable bowel syndrome)   . Arthritis     History   Social History  . Marital Status: Married    Spouse Name: N/A    Number of Children: N/A  . Years of Education: N/A   Occupational History  . Not on file.   Social History Main Topics  . Smoking status: Former Smoker    Quit date: 08/03/1977  . Smokeless tobacco: Never Used  . Alcohol Use: Yes     Comment: occasionally  . Drug Use: No  . Sexually Active: Not Currently   Other Topics Concern  . Not on file   Social History Narrative  . No narrative on file    Past Surgical History  Procedure Laterality Date  . Abdominal hysterectomy  2001    BSO ; SLING PROCEDURE  . Cholecystectomy  1976  . Tonsillectomy  1954  . Dilation and curettage of uterus    . Hand surgery  04-02-2011    right    Family History  Problem Relation Age of Onset  . Diabetes Mother   . Hypertension Mother   . Cancer Mother     Brianna Mack  . COPD Father     Allergies  Allergen Reactions  . Mobic (Meloxicam) Other (See Comments)    Indigestion  . Cephalexin Diarrhea  . Codeine Sulfate     REACTION: nausea, vomiting  . Fluconazole     REACTION: swelling and itching  . Morphine     REACTION: nausea, itching  . Sulfonamide Derivatives     REACTION: rash    Current Outpatient Prescriptions on File Prior to Visit  Medication Sig Dispense Refill  . Acetaminophen (TYLENOL  ARTHRITIS PAIN PO) Take by mouth as needed.        . ALPRAZolam (XANAX) 0.25 MG tablet Take 1 tablet (0.25 mg total) by mouth at bedtime as needed.  30 tablet  4  . Calcium Carbonate-Vitamin D (CALCIUM + D PO) Take by mouth.        . citalopram (CELEXA) 20 MG tablet Take 1 tablet (20 mg total) by mouth daily.  30 tablet  12  . CycloSPORINE (RESTASIS OP) Apply to eye daily.      Marland Kitchen estradiol (ESTRACE) 0.5 MG tablet Take 1 tablet (0.5 mg total) by mouth daily.  30 tablet  11  . estrogens, conjugated, (PREMARIN) 0.45 MG tablet Take 1 tablet (0.45 mg total) by mouth daily.  30 tablet  12  . hyoscyamine (LEVBID) 0.375 MG 12 hr tablet Take 0.375 mg by mouth daily.       . Lactobacillus (ACIDOPHILUS PO) Take by mouth daily.      . Omega-3 Fatty Acids (FISH OIL PO) Take by mouth daily.       No current facility-administered medications on file prior to visit.    BP 120/80  Pulse 66  Temp(Src) 98.2 F (36.8  C) (Oral)  Resp 18  Wt 172 lb (78.019 kg)  BMI 30.94 kg/m2  SpO2 98%     Review of Systems  Constitutional: Negative.   HENT: Negative for hearing loss, congestion, sore throat, rhinorrhea, dental problem, sinus pressure and tinnitus.   Eyes: Negative for pain, discharge and visual disturbance.  Respiratory: Negative for cough and shortness of breath.   Cardiovascular: Negative for chest pain, palpitations and leg swelling.  Gastrointestinal: Negative for nausea, vomiting, abdominal pain, diarrhea, constipation, blood in stool and abdominal distention.  Genitourinary: Negative for dysuria, urgency, frequency, hematuria, flank pain, vaginal bleeding, vaginal discharge, difficulty urinating, vaginal pain and pelvic pain.  Musculoskeletal: Negative for joint swelling, arthralgias and gait problem.  Skin: Negative for rash.  Neurological: Positive for dizziness. Negative for syncope, speech difficulty, weakness, numbness and headaches.  Hematological: Negative for adenopathy.   Psychiatric/Behavioral: Negative for behavioral problems, dysphoric mood and agitation. The patient is not nervous/anxious.        Objective:   Physical Exam  Constitutional: She is oriented to person, place, and time. She appears well-developed and well-nourished.  HENT:  Head: Normocephalic.  Right Ear: External ear normal.  Left Ear: External ear normal.  Mouth/Throat: Oropharynx is clear and moist.  Eyes: Conjunctivae and EOM are normal. Pupils are equal, round, and reactive to light.  Neck: Normal range of motion. Neck supple. No thyromegaly present.  Cardiovascular: Normal rate, regular rhythm, normal heart sounds and intact distal pulses.   Pulmonary/Chest: Effort normal and breath sounds normal.  Abdominal: Soft. Bowel sounds are normal. She exhibits no mass. There is no tenderness.  Musculoskeletal: Normal range of motion.  Lymphadenopathy:    She has no cervical adenopathy.  Neurological: She is alert and oriented to person, place, and time. She has normal reflexes. No cranial nerve deficit. Coordination normal.  Skin: Skin is warm and dry. No rash noted.  Psychiatric: She has a normal mood and affect. Her behavior is normal.          Assessment & Plan:   Benign positional vertigo. The patient get a prescription for meclizine and also given instructions for repositioning exercises if symptoms recur

## 2012-09-05 ENCOUNTER — Encounter: Payer: Self-pay | Admitting: Internal Medicine

## 2012-09-05 ENCOUNTER — Ambulatory Visit (INDEPENDENT_AMBULATORY_CARE_PROVIDER_SITE_OTHER): Payer: Medicare Other | Admitting: Internal Medicine

## 2012-09-05 VITALS — BP 120/80 | HR 63 | Temp 98.4°F | Resp 20 | Wt 173.0 lb

## 2012-09-05 DIAGNOSIS — J3489 Other specified disorders of nose and nasal sinuses: Secondary | ICD-10-CM

## 2012-09-05 DIAGNOSIS — J340 Abscess, furuncle and carbuncle of nose: Secondary | ICD-10-CM

## 2012-09-05 MED ORDER — AMOXICILLIN-POT CLAVULANATE 875-125 MG PO TABS: 1.0000 | ORAL_TABLET | Freq: Two times a day (BID) | ORAL | Status: DC

## 2012-09-05 NOTE — Patient Instructions (Addendum)
Take your antibiotic as prescribed until ALL of it is gone, but stop if you develop a rash, swelling, or any side effects of the medication.  Contact our office as soon as possible if  there are side effects of the medication.Cellulitis Cellulitis is an infection of the skin and the tissue beneath it. The infected area is usually red and tender. Cellulitis occurs most often in the arms and lower legs.  CAUSES  Cellulitis is caused by bacteria that enter the skin through cracks or cuts in the skin. The most common types of bacteria that cause cellulitis are Staphylococcus and Streptococcus. SYMPTOMS   Redness and warmth.  Swelling.  Tenderness or pain.  Fever. DIAGNOSIS  Your caregiver can usually determine what is wrong based on a physical exam. Blood tests may also be done. TREATMENT  Treatment usually involves taking an antibiotic medicine. HOME CARE INSTRUCTIONS   Take your antibiotics as directed. Finish them even if you start to feel better.  Keep the infected arm or leg elevated to reduce swelling.  Apply a warm cloth to the affected area up to 4 times per day to relieve pain.  Only take over-the-counter or prescription medicines for pain, discomfort, or fever as directed by your caregiver.  Keep all follow-up appointments as directed by your caregiver. SEEK MEDICAL CARE IF:   You notice red streaks coming from the infected area.  Your red area gets larger or turns dark in color.  Your bone or joint underneath the infected area becomes painful after the skin has healed.  Your infection returns in the same area or another area.  You notice a swollen bump in the infected area.  You develop new symptoms. SEEK IMMEDIATE MEDICAL CARE IF:   You have a fever.  You feel very sleepy.  You develop vomiting or diarrhea.  You have a general ill feeling (malaise) with muscle aches and pains. MAKE SURE YOU:   Understand these instructions.  Will watch your  condition.  Will get help right away if you are not doing well or get worse. Document Released: 11/12/2004 Document Revised: 08/04/2011 Document Reviewed: 04/20/2011 [employer] Patient Information 2014 [employer].  

## 2012-09-05 NOTE — Progress Notes (Signed)
Subjective:    Patient ID: Brianna Mack, female    DOB: 05/10/1945, 67 y.o.   MRN: [id number]  HPI  67 year old patient with a two-week history of intermittent pain involving the left side of her nose. For the past several days the nose has become more painful and red. Occasional mild rhinorrhea on the left. No fever or chills.  She had a similar problem in November of last year  Past Medical History  Diagnosis Date  . Endometriosis   . Fibroid   . Depression   . Anxiety   . IBS (irritable bowel syndrome)   . Arthritis     History   Social History  . Marital Status: Married    Spouse Name: N/A    Number of Children: N/A  . Years of Education: N/A   Occupational History  . Not on file.   Social History Main Topics  . Smoking status: Former Smoker    Quit date: 08/03/1977  . Smokeless tobacco: Never Used  . Alcohol Use: Yes     Comment: occasionally  . Drug Use: No  . Sexually Active: Not Currently   Other Topics Concern  . Not on file   Social History Narrative  . No narrative on file    Past Surgical History  Procedure Laterality Date  . Abdominal hysterectomy  2001    BSO ; SLING PROCEDURE  . Cholecystectomy  1976  . Tonsillectomy  1954  . Dilation and curettage of uterus    . Hand surgery  04-02-2011    right    Family History  Problem Relation Age of Onset  . Diabetes Mother   . Hypertension Mother   . Cancer Mother     Eda Keys  . COPD Father     Allergies  Allergen Reactions  . Cephalexin Diarrhea  . Codeine Sulfate     REACTION: nausea, vomiting  . Fluconazole     REACTION: swelling and itching  . Morphine     REACTION: nausea, itching  . Sulfonamide Derivatives     REACTION: rash    Current Outpatient Prescriptions on File Prior to Visit  Medication Sig Dispense Refill  . Acetaminophen (TYLENOL ARTHRITIS PAIN PO) Take by mouth as needed.        . ALPRAZolam (XANAX) 0.25 MG tablet Take 1 tablet (0.25 mg total) by mouth at  bedtime as needed.  30 tablet  4  . Calcium Carbonate-Vitamin D (CALCIUM + D PO) Take by mouth.        . citalopram (CELEXA) 20 MG tablet Take 1 tablet (20 mg total) by mouth daily.  30 tablet  12  . CycloSPORINE (RESTASIS OP) Apply to eye daily.      Marland Kitchen estradiol (ESTRACE) 0.5 MG tablet Take 1 tablet (0.5 mg total) by mouth daily.  30 tablet  11  . hyoscyamine (LEVBID) 0.375 MG 12 hr tablet Take 0.375 mg by mouth daily.       . Lactobacillus (ACIDOPHILUS PO) Take by mouth daily.      . Omega-3 Fatty Acids (FISH OIL PO) Take by mouth daily.       No current facility-administered medications on file prior to visit.    BP 120/80  Pulse 63  Temp(Src) 98.4 F (36.9 C) (Oral)  Resp 20  Wt 173 lb (78.472 kg)  BMI 31.12 kg/m2  SpO2 98%       Review of Systems  Skin: Positive for wound.       Objective:  Physical Exam  Constitutional: She appears well-developed and well-nourished. No distress.  HENT:  Right Ear: External ear normal.  Left Ear: External ear normal.  Mouth/Throat: Oropharynx is clear and moist.  The left side the nose was slightly tender and erythematous No definite pustule noted involving the left nares          Assessment & Plan:   Cellulitis of the nose. Will treat with Augmentin for 7 days. Will also treated with probiotics. The patient has had some difficulty with antibiotic associated diarrhea and also antibiotic induced  vaginal yeast infections in the past

## 2012-09-12 ENCOUNTER — Encounter: Payer: Self-pay | Admitting: Gynecology

## 2012-09-19 ENCOUNTER — Ambulatory Visit (INDEPENDENT_AMBULATORY_CARE_PROVIDER_SITE_OTHER): Payer: Medicare Other | Admitting: Gynecology

## 2012-09-19 ENCOUNTER — Encounter: Payer: Self-pay | Admitting: Gynecology

## 2012-09-19 VITALS — BP 124/74 | Ht 63.0 in | Wt 158.0 lb

## 2012-09-19 DIAGNOSIS — Z8719 Personal history of other diseases of the digestive system: Secondary | ICD-10-CM

## 2012-09-19 DIAGNOSIS — Z79890 Hormone replacement therapy (postmenopausal): Secondary | ICD-10-CM

## 2012-09-19 DIAGNOSIS — N951 Menopausal and female climacteric states: Secondary | ICD-10-CM

## 2012-09-19 DIAGNOSIS — F341 Dysthymic disorder: Secondary | ICD-10-CM

## 2012-09-19 DIAGNOSIS — N952 Postmenopausal atrophic vaginitis: Secondary | ICD-10-CM

## 2012-09-19 DIAGNOSIS — F411 Generalized anxiety disorder: Secondary | ICD-10-CM

## 2012-09-19 DIAGNOSIS — Z0000 Encounter for general adult medical examination without abnormal findings: Secondary | ICD-10-CM

## 2012-09-19 MED ORDER — ALPRAZOLAM 0.25 MG PO TABS: 0.2500 mg | ORAL_TABLET | Freq: Every evening | ORAL | Status: DC | PRN

## 2012-09-19 MED ORDER — ESTRADIOL 0.5 MG PO TABS: 0.5000 mg | ORAL_TABLET | Freq: Every day | ORAL | Status: DC

## 2012-09-19 MED ORDER — CITALOPRAM HYDROBROMIDE 20 MG PO TABS: 20.0000 mg | ORAL_TABLET | Freq: Every day | ORAL | Status: DC

## 2012-09-19 NOTE — Progress Notes (Signed)
Brianna Mack [facility] Dec 18, 1945 [id number]        67 y.o.  G0P0 for followup exam.  Former patient of Dr. Eda Paschal with several issues noted below.  Past medical history,surgical history, medications, allergies, family history and social history were all reviewed and documented in the EPIC chart.  ROS:  Performed and pertinent positives and negatives are included in the history, assessment and plan .  Exam: [name] assistant Filed Vitals:   09/19/12 1555  BP: 124/74  Height: 5\' 3"  (1.6 m)  Weight: 158 lb (71.668 kg)   General appearance  Normal Skin grossly normal Head/Neck normal with no cervical or supraclavicular adenopathy thyroid normal Lungs  clear Cardiac RR, without RMG Abdominal  soft, nontender, without masses, organomegaly or hernia Breasts  examined lying and sitting without masses, retractions, discharge or axillary adenopathy. Pelvic  Ext/BUS/vagina  normal with atrophic changes   Adnexa  Without masses or tenderness    Anus and perineum  normal   Rectovaginal  normal sphincter tone without palpated masses or tenderness.    Assessment/Plan:  67 y.o. G0P0 female for followup exam.   1. History of TAH BSO for endometriosis and leiomyoma. On estradiol 0.5 mg daily for hot flashes and sweats.  I reviewed the whole issue of HRT with her to include the WHI study with increased risk of stroke, heart attack, DVT and breast cancer. The ACOG and NAMS statements for lowest dose for the shortest period of time reviewed. Transdermal versus oral first-pass effect benefit discussed.  Patient wants to continue now. She notes when she has forgotten to take it by several days she has unacceptable hot flushes.  Declines transdermal. Estradiol 0.5 mg refill x1 year 2. Anxiety. Husband had stroke last year she's dealing with a lot now. Had been started on Celexa 20 mg daily by Dr. Eda Paschal as well as Xanax 0.25 each bedtime when necessary insomnia. Does not feel she can do without these at this time  and I refilled her Celexa x1 year. Xanax 0.25 #30 with 5 refills provided. 3. Pap smear 2012. No Pap smear done today. No history of abnormal Pap smears previously. Reviewed current screening guidelines. Patient is over the age of 8 and has had a hysterectomy for benign indications. Office cystoscopy altogether less frequent screening intervals discussed. We'll readdress on an annual basis. 4. Mammography 08/2012. Continued annual mammography. SBE monthly reviewed. 5. Colonoscopy 2009. Dr. Kinnie Scales had recommended repeat in 5 year interval and she can call him to arrange this. 6. DEXA 2011 normal. Recommend repeat at 5 year interval. Increase calcium vitamin D reviewed. 7. Health maintenance. No blood work done as this is all done through her primary physician's office. Followup one year, sooner as needed.  Note: This document was prepared with digital dictation and possible smart phrase technology. Any transcriptional errors that result from this process are unintentional.   Dara Lords MD, 4:40 PM 09/19/2012

## 2012-09-19 NOTE — Patient Instructions (Signed)
Follow up in one year, sooner as needed. 

## 2012-09-20 ENCOUNTER — Encounter: Payer: Self-pay | Admitting: Obstetrics and Gynecology

## 2012-11-01 ENCOUNTER — Other Ambulatory Visit: Payer: Self-pay | Admitting: Internal Medicine

## 2012-11-01 ENCOUNTER — Telehealth: Payer: Self-pay | Admitting: Internal Medicine

## 2012-11-01 MED ORDER — AMOXICILLIN-POT CLAVULANATE 875-125 MG PO TABS: 1.0000 | ORAL_TABLET | Freq: Two times a day (BID) | ORAL | Status: DC

## 2012-11-01 NOTE — Telephone Encounter (Signed)
20

## 2012-11-01 NOTE — Telephone Encounter (Signed)
Pt's infection in her nose has returned and would like to know if you would refill the amoxicillin-clavulanate (AUGMENTIN) 875-125 MG per tablet   1/ bid Pt states this helped clear up infection last time. Pharm: [facility]

## 2012-11-01 NOTE — Telephone Encounter (Signed)
Please advise if okay to refill antibiotic?

## 2012-11-01 NOTE — Telephone Encounter (Signed)
Pt notified Rx sent to pharmacy

## 2012-11-09 ENCOUNTER — Encounter: Payer: Self-pay | Admitting: Internal Medicine

## 2012-11-09 ENCOUNTER — Telehealth: Payer: Self-pay | Admitting: Internal Medicine

## 2012-11-09 ENCOUNTER — Ambulatory Visit (INDEPENDENT_AMBULATORY_CARE_PROVIDER_SITE_OTHER): Payer: Medicare Other | Admitting: Internal Medicine

## 2012-11-09 VITALS — BP 130/80 | HR 103 | Temp 98.7°F | Resp 20 | Wt 173.0 lb

## 2012-11-09 DIAGNOSIS — T7840XA Allergy, unspecified, initial encounter: Secondary | ICD-10-CM

## 2012-11-09 MED ORDER — METHYLPREDNISOLONE ACETATE 80 MG/ML IJ SUSP
80.0000 mg | Freq: Once | INTRAMUSCULAR | Status: AC
  Administered 2012-11-09: 80 mg via INTRAMUSCULAR

## 2012-11-09 NOTE — Patient Instructions (Signed)
Call or return to clinic prn if these symptoms worsen or fail to improve as anticipated.

## 2012-11-09 NOTE — Telephone Encounter (Signed)
It looks like Call A Nurse took care of this. Please call the patient to see if they have any other questions.

## 2012-11-09 NOTE — Telephone Encounter (Signed)
Patient Information:  Caller Name: Mykela  Phone: [phone]  Patient: Brianna Mack, Brianna Mack  Gender: Female  DOB: Mar 13, 1945  Age: 67 Years  PCP: Eleonore Chiquito [facility])  Office Follow Up:  Does the office need to follow up with this patient?: Yes  Instructions For The Office: Please call with information on onset of Depo Medrol for hives.  RN Note:  Was seen in office already 11/09/12.  Called to ask if generalized hives could be shingles.  Explained her rash does not sound like shingles.  Itchy hives began while taking Augmentin.  Hives are "not worse" and may be slightly better; called to ask how long before DepoMedrol will work. RN unable to find onset of DepoMedrol in several websites.  Please call back to advise when to expect effects of Depo Medrol.  Symptoms  Reason For Call & Symptoms: Seen 11/09/12 for allergic reaction.  Severe itchy hives on back of neck, scalp, ears, arms and trunk.  Treated with IM "Cortisone" at 1130 but still itchy.  Took 25 mg Benadryl at 1215; also had one Bendaryl at 0600 and 0830.  Stopped taking Augmentin after morning dose 11/08/12.  Reviewed Health History In EMR: Yes  Reviewed Medications In EMR: Yes  Reviewed Allergies In EMR: Yes  Reviewed Surgeries / Procedures: Yes  Date of Onset of Symptoms: 11/08/2012  Treatments Tried: Benadryl, DepoMedorol IM 11/09/12  Treatments Tried Worked: No  Guideline(s) Used:  Hives  Rash - Widespread on Drugs - Drug Reaction  Disposition Per Guideline:   See Today in Office  Reason For Disposition Reached:   Hives  Advice Given:  Stopping the Medication:  If medication is an antibiotic - stop the medication. Reason: possible allergy.  If rash is hives or is very itchy - stop the medication. Reason: possible allergy.  For Itchy Rashes:  Wash the skin once with soap to remove any irritants. Use Benadryl or take an Aveeno bath to reduce the itching.  Oral Antihistamine Medication for Itching:  Take an  antihistamine like diphenhydramine (Benadryl) for widespread rashes that itch. The adult dosage of Benadryl is 25-50 mg by mouth 4 times daily.  CAUTION: This type of medication may cause sleepiness. Do not drink alcohol, drive, or operate dangerous machinery while taking antihistamines. Do not take these medications if you have prostate problems.  RN Overrode Recommendation:  Patient Already Has Appt, Document Patient  Seen 11/09/12; called back with medication question.

## 2012-11-09 NOTE — Telephone Encounter (Signed)
The hives are probably from the antibiotic so stop it. The depo

## 2012-11-09 NOTE — Progress Notes (Signed)
Subjective:    Patient ID: Brianna Mack, female    DOB: 07-01-1945, 67 y.o.   MRN: [id number]  HPI  67 year old patient who was seen in July of this year and treated with Augmentin for cellulitis involving the left  nose region. She responded well to antibiotic therapy and had no difficulties but was recreated earlier this month with recurrent pain redness and swelling involving the right nose region.  After several days of antibiotic therapy with Augmentin she developed significant pruritus involving primarily the skin of the scalp region. She has discontinued antibiotic therapy still has considerable itching in spite of Benadryl use. She's also had some associated mild headache.  Past Medical History  Diagnosis Date  . Endometriosis   . Fibroid   . Depression   . Anxiety   . IBS (irritable bowel syndrome)   . Arthritis     History   Social History  . Marital Status: Married    Spouse Name: N/A    Number of Children: N/A  . Years of Education: N/A   Occupational History  . Not on file.   Social History Main Topics  . Smoking status: Former Smoker    Quit date: 08/03/1977  . Smokeless tobacco: Never Used  . Alcohol Use: Yes     Comment: occasionally  . Drug Use: No  . Sexual Activity: Not Currently   Other Topics Concern  . Not on file   Social History Narrative  . No narrative on file    Past Surgical History  Procedure Laterality Date  . Cholecystectomy  1976  . Tonsillectomy  1954  . Dilation and curettage of uterus    . Hand surgery  04-02-2011    right  . Oophorectomy      BSO  . Abdominal hysterectomy  2001    BSO ; SLING PROCEDURE endometriosis/leiomyomata    Family History  Problem Relation Age of Onset  . Diabetes Mother   . Hypertension Mother   . Cancer Mother     Eda Keys  . COPD Father     Allergies  Allergen Reactions  . Augmentin [Amoxicillin-Pot Clavulanate] Itching    Headache  . Cephalexin Diarrhea  . Codeine Sulfate    REACTION: nausea, vomiting  . Fluconazole     REACTION: swelling and itching  . Morphine     REACTION: nausea, itching  . Sulfonamide Derivatives     REACTION: rash    Current Outpatient Prescriptions on File Prior to Visit  Medication Sig Dispense Refill  . Acetaminophen (TYLENOL ARTHRITIS PAIN PO) Take by mouth as needed.        . Calcium Carbonate-Vitamin D (CALCIUM + D PO) Take by mouth.        . Cholecalciferol (VITAMIN D PO) Take by mouth.      . citalopram (CELEXA) 20 MG tablet Take 1 tablet (20 mg total) by mouth daily.  30 tablet  12  . CycloSPORINE (RESTASIS OP) Apply to eye daily.      Marland Kitchen estradiol (ESTRACE) 0.5 MG tablet Take 1 tablet (0.5 mg total) by mouth daily.  30 tablet  11  . hyoscyamine (LEVBID) 0.375 MG 12 hr tablet Take 0.375 mg by mouth daily.       . Lactobacillus (ACIDOPHILUS PO) Take by mouth daily.      . meloxicam (MOBIC) 7.5 MG tablet Take 7.5 mg by mouth daily.       No current facility-administered medications on file prior to visit.  BP 130/80  Pulse 103  Temp(Src) 98.7 F (37.1 C) (Oral)  Resp 20  Wt 173 lb (78.472 kg)  BMI 30.65 kg/m2  SpO2 96%       Review of Systems  Constitutional: Negative.   HENT: Negative for hearing loss, congestion, sore throat, rhinorrhea, dental problem, sinus pressure and tinnitus.   Eyes: Negative for pain, discharge and visual disturbance.  Respiratory: Negative for cough and shortness of breath.   Cardiovascular: Negative for chest pain, palpitations and leg swelling.  Gastrointestinal: Negative for nausea, vomiting, abdominal pain, diarrhea, constipation, blood in stool and abdominal distention.  Genitourinary: Negative for dysuria, urgency, frequency, hematuria, flank pain, vaginal bleeding, vaginal discharge, difficulty urinating, vaginal pain and pelvic pain.  Musculoskeletal: Negative for joint swelling, arthralgias and gait problem.  Skin: Negative for rash.       The rash but  significant pruritus  following antibiotic use  Neurological: Negative for dizziness, syncope, speech difficulty, weakness, numbness and headaches.  Hematological: Negative for adenopathy.  Psychiatric/Behavioral: Negative for behavioral problems, dysphoric mood and agitation. The patient is not nervous/anxious.        Objective:   Physical Exam  Constitutional: She appears well-developed and well-nourished. No distress.  Pulmonary/Chest: She has no wheezes.  Skin: No rash noted.          Assessment & Plan:   Probable allergic reaction to Augmentin. This antibiotic has already been discontinued we'll continue Benadryl entry with Depo-Medrol 80 mg IM. Her chart was marked as allergic to Augmentin

## 2012-11-10 ENCOUNTER — Ambulatory Visit: Payer: Self-pay | Admitting: Family Medicine

## 2012-11-10 ENCOUNTER — Ambulatory Visit (INDEPENDENT_AMBULATORY_CARE_PROVIDER_SITE_OTHER): Payer: Medicare Other | Admitting: Internal Medicine

## 2012-11-10 ENCOUNTER — Encounter: Payer: Self-pay | Admitting: Internal Medicine

## 2012-11-10 VITALS — BP 120/80 | HR 76 | Temp 98.2°F | Resp 20

## 2012-11-10 DIAGNOSIS — L509 Urticaria, unspecified: Secondary | ICD-10-CM

## 2012-11-10 MED ORDER — PREDNISONE 20 MG PO TABS: 20.0000 mg | ORAL_TABLET | Freq: Every day | ORAL | Status: DC

## 2012-11-10 NOTE — Telephone Encounter (Signed)
Patient Information:  Caller Name: Lareta  Phone: [phone]  Patient: Brianna Mack, Brianna Mack  Gender: Female  DOB: 1945/03/19  Age: 67 Years  PCP: Eleonore Chiquito [facility])  Office Follow Up:  Does the office need to follow up with this patient?: No  Instructions For The Office: N/A  RN Note:  Took one Benadryl at 0800.  Also reported received steroid injection in shoulder 10/14/12.  Symptoms  Reason For Call & Symptoms: Called re ongoing itching and hives and developed lip and facial edema last night, 11/09/12.  Diagnosed with allergic reaction to Augmentin; treated with IM DepoMedrol on  11/08/12; Called back 11/09/12 to ask when Depo Medrol would begin working but did not receive a call back.  Able to swallow, no swelling of tongue or breathing problems.  Reviewed Health History In EMR: Yes  Reviewed Medications In EMR: Yes  Reviewed Allergies In EMR: Yes  Reviewed Surgeries / Procedures: Yes  Date of Onset of Symptoms: 11/09/2012  Treatments Tried: Benadryl, Alprazolam  Treatments Tried Worked: Yes  Guideline(s) Used:  Face Swelling  Disposition Per Guideline:   Go to Office Now  Reason For Disposition Reached:   SEVERE swelling of the entire face  Advice Given:  Apply Cold to the Area:  Apply a cool, wet washcloth to the face for 20 minutes.  Call Back If  You become worse.  Patient Will Follow Care Advice:  YES  Appointment Scheduled:  11/10/2012 11:00:00 Appointment Scheduled Provider:  Selena Batten (TEXT 1st, after 20 mins can call), Dahlia Client [facility])

## 2012-11-10 NOTE — Progress Notes (Signed)
Subjective:    Patient ID: Brianna Mack, female    DOB: 05/31/1945, 67 y.o.   MRN: [id number]  HPI  67 year old patient who was seen yesterday for suspected drug allergic reaction. She was treated with Augmentin earlier due to a cellulitis involving the nose and due to recurrent symptoms was retreated with Augmentin. She developed primarily pruritus involving the scalp area and was seen yesterday for evaluation. The medication had been discontinued and she did receive an injection of Depo-Medrol. She has been treated with Benadryl as well. Past 24 hours she has developed facial edema and a generalized pruritic urticarial rash involving primarily the trunk upper extremities as well as the facial edema. The lower extremities have been spared  Past Medical History  Diagnosis Date  . Endometriosis   . Fibroid   . Depression   . Anxiety   . IBS (irritable bowel syndrome)   . Arthritis     History   Social History  . Marital Status: Married    Spouse Name: N/A    Number of Children: N/A  . Years of Education: N/A   Occupational History  . Not on file.   Social History Main Topics  . Smoking status: Former Smoker    Quit date: 08/03/1977  . Smokeless tobacco: Never Used  . Alcohol Use: Yes     Comment: occasionally  . Drug Use: No  . Sexual Activity: Not Currently   Other Topics Concern  . Not on file   Social History Narrative  . No narrative on file    Past Surgical History  Procedure Laterality Date  . Cholecystectomy  1976  . Tonsillectomy  1954  . Dilation and curettage of uterus    . Hand surgery  04-02-2011    right  . Oophorectomy      BSO  . Abdominal hysterectomy  2001    BSO ; SLING PROCEDURE endometriosis/leiomyomata    Family History  Problem Relation Age of Onset  . Diabetes Mother   . Hypertension Mother   . Cancer Mother     Eda Keys  . COPD Father     Allergies  Allergen Reactions  . Augmentin [Amoxicillin-Pot Clavulanate] Itching     Headache  . Cephalexin Diarrhea  . Codeine Sulfate     REACTION: nausea, vomiting  . Fluconazole     REACTION: swelling and itching  . Morphine     REACTION: nausea, itching  . Sulfonamide Derivatives     REACTION: rash    Current Outpatient Prescriptions on File Prior to Visit  Medication Sig Dispense Refill  . Acetaminophen (TYLENOL ARTHRITIS PAIN PO) Take by mouth as needed.        . Calcium Carbonate-Vitamin D (CALCIUM + D PO) Take by mouth.        . Cholecalciferol (VITAMIN D PO) Take by mouth.      . citalopram (CELEXA) 20 MG tablet Take 1 tablet (20 mg total) by mouth daily.  30 tablet  12  . CycloSPORINE (RESTASIS OP) Apply to eye daily.      Marland Kitchen estradiol (ESTRACE) 0.5 MG tablet Take 1 tablet (0.5 mg total) by mouth daily.  30 tablet  11  . hyoscyamine (LEVBID) 0.375 MG 12 hr tablet Take 0.375 mg by mouth daily.       . Lactobacillus (ACIDOPHILUS PO) Take by mouth daily.      . meloxicam (MOBIC) 7.5 MG tablet Take 7.5 mg by mouth daily.       No  current facility-administered medications on file prior to visit.    BP 120/80  Pulse 76  Temp(Src) 98.2 F (36.8 C) (Oral)  Resp 20  SpO2 96%     Review of Systems  Respiratory: Negative for shortness of breath and wheezing.   Skin: Positive for rash.       Objective:   Physical Exam  Constitutional: She appears well-developed and well-nourished. No distress.  HENT:  Uvula and tongue not swollen  Cardiovascular:  Pulse rate 76  Pulmonary/Chest: She has no wheezes.  Skin:  Facial especially periorbital edema  Urticarial rash involving the trunk and upper extremities           Assessment & Plan:   Hives. Augmentin has been discontinued and her chart marked allergic. She received a dose of Depo-Medrol yesterday and will supplement with oral prednisone. We'll continue antihistamines and also add an H2 blocker. She does have alprazolam and Zyrtec at home.  She will call there is any clinical deterioration

## 2012-11-10 NOTE — Telephone Encounter (Signed)
Please document that you spoke to the pt.  Thanks!

## 2012-11-10 NOTE — Patient Instructions (Addendum)
Hives Hives are itchy, red, swollen areas of the skin. They can vary in size and location on your body. Hives can come and go for hours or several days (acute hives) or for several weeks (chronic hives). Hives do not spread from person to person (noncontagious). They may get worse with scratching, exercise, and emotional stress. CAUSES   Allergic reaction to food, additives, or drugs.  Infections, including the common cold.  Illness, such as vasculitis, lupus, or thyroid disease.  Exposure to sunlight, heat, or cold.  Exercise.  Stress.  Contact with chemicals. SYMPTOMS   Red or white swollen patches on the skin. The patches may change size, shape, and location quickly and repeatedly.  Itching.  Swelling of the hands, feet, and face. This may occur if hives develop deeper in the skin. DIAGNOSIS  Your caregiver can usually tell what is wrong by performing a physical exam. Skin or blood tests may also be done to determine the cause of your hives. In some cases, the cause cannot be determined. TREATMENT  Mild cases usually get better with medicines such as antihistamines. Severe cases may require an emergency epinephrine injection. If the cause of your hives is known, treatment includes avoiding that trigger.  HOME CARE INSTRUCTIONS   Avoid causes that trigger your hives.  Take antihistamines as directed by your caregiver to reduce the severity of your hives. Non-sedating or low-sedating antihistamines are usually recommended. Do not drive while taking an antihistamine.  Take any other medicines prescribed for itching as directed by your caregiver.  Wear loose-fitting clothing.  Keep all follow-up appointments as directed by your caregiver. SEEK MEDICAL CARE IF:   You have persistent or severe itching that is not relieved with medicine.  You have painful or swollen joints. SEEK IMMEDIATE MEDICAL CARE IF:   You have a fever.  Your tongue or lips are swollen.  You have  trouble breathing or swallowing.  You feel tightness in the throat or chest.  You have abdominal pain. These problems may be the first sign of a life-threatening allergic reaction. Call your local emergency services (911 in [place] MAKE SURE YOU:   Understand these instructions.  Will watch your condition.  Will get help right away if you are not doing well or get worse. Document Released: 02/02/2005 Document Revised: 08/04/2011 Document Reviewed: 04/28/2011 [facility] Patient Information 2014 [place], [place].  Pepcid 1 tablet twice daily until hives has resolved  Continue antihistamine therapy with Benadryl ( or Allegra Claritin and Zyrtec etc.)

## 2012-12-12 ENCOUNTER — Ambulatory Visit (INDEPENDENT_AMBULATORY_CARE_PROVIDER_SITE_OTHER): Payer: Medicare Other

## 2012-12-12 DIAGNOSIS — Z23 Encounter for immunization: Secondary | ICD-10-CM

## 2013-01-25 ENCOUNTER — Telehealth: Payer: Self-pay | Admitting: *Deleted

## 2013-01-25 NOTE — Telephone Encounter (Signed)
Left message on voicemail to call office. Insurance papers faxed to [phone] [employer] and copy mailed to pt.

## 2013-01-26 NOTE — Telephone Encounter (Signed)
Spoke to pt told her papers for insurance were faxed to [employer] and I mailed copy to you. Pt verbalized understanding.

## 2013-02-15 ENCOUNTER — Encounter: Payer: Self-pay | Admitting: Internal Medicine

## 2013-02-15 ENCOUNTER — Ambulatory Visit (INDEPENDENT_AMBULATORY_CARE_PROVIDER_SITE_OTHER): Payer: Medicare Other | Admitting: Internal Medicine

## 2013-02-15 VITALS — BP 136/80 | HR 66 | Temp 97.7°F | Resp 18 | Ht 63.0 in | Wt 178.0 lb

## 2013-02-15 DIAGNOSIS — J069 Acute upper respiratory infection, unspecified: Secondary | ICD-10-CM

## 2013-02-15 MED ORDER — BENZONATATE 200 MG PO CAPS: 200.0000 mg | ORAL_CAPSULE | Freq: Two times a day (BID) | ORAL | Status: DC | PRN

## 2013-02-15 NOTE — Progress Notes (Signed)
Subjective:    Patient ID: Brianna Mack, female    DOB: April 19, 1945, 67 y.o.   MRN: [id number]  HPI  67 year old patient who presents with a ten-day history of sinus congestion pressure cough and general sense of unwellness.  Cough is nonproductive. No fever. No focal sinus pain or tenderness. She has been using decongestants and more recently has improved.  Past Medical History  Diagnosis Date  . Endometriosis   . Fibroid   . Depression   . Anxiety   . IBS (irritable bowel syndrome)   . Arthritis     History   Social History  . Marital Status: Married    Spouse Name: N/A    Number of Children: N/A  . Years of Education: N/A   Occupational History  . Not on file.   Social History Main Topics  . Smoking status: Former Smoker    Quit date: 08/03/1977  . Smokeless tobacco: Never Used  . Alcohol Use: Yes     Comment: occasionally  . Drug Use: No  . Sexual Activity: Not Currently   Other Topics Concern  . Not on file   Social History Narrative  . No narrative on file    Past Surgical History  Procedure Laterality Date  . Cholecystectomy  1976  . Tonsillectomy  1954  . Dilation and curettage of uterus    . Hand surgery  04-02-2011    right  . Oophorectomy      BSO  . Abdominal hysterectomy  2001    BSO ; SLING PROCEDURE endometriosis/leiomyomata    Family History  Problem Relation Age of Onset  . Diabetes Mother   . Hypertension Mother   . Cancer Mother     Eda Keys  . COPD Father     Allergies  Allergen Reactions  . Augmentin [Amoxicillin-Pot Clavulanate] Itching    Headache  . Cephalexin Diarrhea  . Codeine Sulfate     REACTION: nausea, vomiting  . Fluconazole     REACTION: swelling and itching  . Morphine     REACTION: nausea, itching  . Sulfonamide Derivatives     REACTION: rash    Current Outpatient Prescriptions on File Prior to Visit  Medication Sig Dispense Refill  . Acetaminophen (TYLENOL ARTHRITIS PAIN PO) Take by mouth as  needed.        . Calcium Carbonate-Vitamin D (CALCIUM + D PO) Take by mouth.        . Cholecalciferol (VITAMIN D PO) Take by mouth.      . citalopram (CELEXA) 20 MG tablet Take 1 tablet (20 mg total) by mouth daily.  30 tablet  12  . CycloSPORINE (RESTASIS OP) Apply to eye daily.      Marland Kitchen estradiol (ESTRACE) 0.5 MG tablet Take 1 tablet (0.5 mg total) by mouth daily.  30 tablet  11  . hyoscyamine (LEVBID) 0.375 MG 12 hr tablet Take 0.375 mg by mouth daily.       . Lactobacillus (ACIDOPHILUS PO) Take by mouth daily.       No current facility-administered medications on file prior to visit.    BP 136/80  Pulse 66  Temp(Src) 97.7 F (36.5 C) (Oral)  Resp 18  Ht 5\' 3"  (1.6 m)  Wt 178 lb (80.74 kg)  BMI 31.54 kg/m2  SpO2 99%       Review of Systems  Constitutional: Positive for activity change, appetite change and fatigue.  HENT: Positive for postnasal drip and rhinorrhea. Negative for congestion, dental  problem, hearing loss, sinus pressure, sore throat and tinnitus.   Eyes: Negative for pain, discharge and visual disturbance.  Respiratory: Positive for cough. Negative for shortness of breath.   Cardiovascular: Negative for chest pain, palpitations and leg swelling.  Gastrointestinal: Negative for nausea, vomiting, abdominal pain, diarrhea, constipation, blood in stool and abdominal distention.  Genitourinary: Negative for dysuria, urgency, frequency, hematuria, flank pain, vaginal bleeding, vaginal discharge, difficulty urinating, vaginal pain and pelvic pain.  Musculoskeletal: Negative for arthralgias, gait problem and joint swelling.  Skin: Negative for rash.  Neurological: Negative for dizziness, syncope, speech difficulty, weakness, numbness and headaches.  Hematological: Negative for adenopathy.  Psychiatric/Behavioral: Negative for behavioral problems, dysphoric mood and agitation. The patient is not nervous/anxious.        Objective:   Physical Exam  Constitutional: She  is oriented to person, place, and time. She appears well-developed and well-nourished.  HENT:  Head: Normocephalic.  Right Ear: External ear normal.  Left Ear: External ear normal.  Mouth/Throat: Oropharynx is clear and moist.  Eyes: Conjunctivae and EOM are normal. Pupils are equal, round, and reactive to light.  Neck: Normal range of motion. Neck supple. No thyromegaly present.  Cardiovascular: Normal rate, regular rhythm, normal heart sounds and intact distal pulses.   Pulmonary/Chest: Effort normal and breath sounds normal.  Abdominal: Soft. Bowel sounds are normal. She exhibits no mass. There is no tenderness.  Musculoskeletal: Normal range of motion.  Lymphadenopathy:    She has no cervical adenopathy.  Neurological: She is alert and oriented to person, place, and time.  Skin: Skin is warm and dry. No rash noted.  Psychiatric: She has a normal mood and affect. Her behavior is normal.          Assessment & Plan:   Viral URI with cough. Will treat symptomatically

## 2013-02-15 NOTE — Patient Instructions (Signed)
Use saline irrigation, warm  moist compresses and over-the-counter decongestants only as directed.  Call if there is no improvement in 5 to 7 days, or sooner if you develop increasing pain, fever, or any new symptoms.  Mucinex DM twice daily for cough

## 2013-02-15 NOTE — Progress Notes (Signed)
Pre-visit discussion using our clinic review tool. No additional management support is needed unless otherwise documented below in the visit note.  

## 2013-06-05 ENCOUNTER — Encounter: Payer: Self-pay | Admitting: Internal Medicine

## 2013-06-05 ENCOUNTER — Ambulatory Visit (INDEPENDENT_AMBULATORY_CARE_PROVIDER_SITE_OTHER): Payer: Medicare Other | Admitting: Internal Medicine

## 2013-06-05 VITALS — BP 130/74 | HR 72 | Temp 97.9°F | Resp 20 | Ht 63.0 in | Wt 177.0 lb

## 2013-06-05 DIAGNOSIS — M62838 Other muscle spasm: Secondary | ICD-10-CM

## 2013-06-05 MED ORDER — CYCLOBENZAPRINE HCL 5 MG PO TABS: 5.0000 mg | ORAL_TABLET | Freq: Three times a day (TID) | ORAL | Status: DC | PRN

## 2013-06-05 MED ORDER — MELOXICAM 7.5 MG PO TABS: 7.5000 mg | ORAL_TABLET | Freq: Every day | ORAL | Status: DC

## 2013-06-05 NOTE — Progress Notes (Signed)
Pre-visit discussion using our clinic review tool. No additional management support is needed unless otherwise documented below in the visit note.  

## 2013-06-05 NOTE — Patient Instructions (Signed)
Most patients with low back pain will improve with time over the next two to 6 weeks.  Keep active but avoid any activities that cause pain.  Apply moist heat to the low back area several times daily. 

## 2013-06-05 NOTE — Progress Notes (Signed)
Subjective:    Patient ID: Brianna Mack, female    DOB: Jan 08, 1946, 68 y.o.   MRN: [id number]  HPI   68 year old patient who is seen today with 2 concerns.  She has been seen by orthopedics recently for left upper arm pain.  This has been confirmed to be a small rotator cuff tear by MRI.  She also complains of some right buttock pain.  This has been intermittent from the past to use responds well to anti-inflammatories and stretching. For the past 2 years.  She is cared for her husband, who has been disabled secondary to a stroke  Past Medical History  Diagnosis Date  . Endometriosis   . Fibroid   . Depression   . Anxiety   . IBS (irritable bowel syndrome)   . Arthritis     History   Social History  . Marital Status: Married    Spouse Name: N/A    Number of Children: N/A  . Years of Education: N/A   Occupational History  . Not on file.   Social History Main Topics  . Smoking status: Former Smoker    Quit date: 08/03/1977  . Smokeless tobacco: Never Used  . Alcohol Use: Yes     Comment: occasionally  . Drug Use: No  . Sexual Activity: Not Currently   Other Topics Concern  . Not on file   Social History Narrative  . No narrative on file    Past Surgical History  Procedure Laterality Date  . Cholecystectomy  1976  . Tonsillectomy  1954  . Dilation and curettage of uterus    . Hand surgery  04-02-2011    right  . Oophorectomy      BSO  . Abdominal hysterectomy  2001    BSO ; SLING PROCEDURE endometriosis/leiomyomata    Family History  Problem Relation Age of Onset  . Diabetes Mother   . Hypertension Mother   . Cancer Mother     Laurell Roof  . COPD Father     Allergies  Allergen Reactions  . Augmentin [Amoxicillin-Pot Clavulanate] Itching    Headache  . Cephalexin Diarrhea  . Codeine Sulfate     REACTION: nausea, vomiting  . Fluconazole     REACTION: swelling and itching  . Morphine     REACTION: nausea, itching  . Sulfonamide Derivatives       REACTION: rash    Current Outpatient Prescriptions on File Prior to Visit  Medication Sig Dispense Refill  . Acetaminophen (TYLENOL ARTHRITIS PAIN PO) Take by mouth as needed.        . Calcium Carbonate-Vitamin D (CALCIUM + D PO) Take by mouth.        . Cholecalciferol (VITAMIN D PO) Take by mouth.      . citalopram (CELEXA) 20 MG tablet Take 1 tablet (20 mg total) by mouth daily.  30 tablet  12  . CycloSPORINE (RESTASIS OP) Apply to eye daily.      Marland Kitchen estradiol (ESTRACE) 0.5 MG tablet Take 1 tablet (0.5 mg total) by mouth daily.  30 tablet  11  . hyoscyamine (LEVBID) 0.375 MG 12 hr tablet Take 0.375 mg by mouth daily.       . Lactobacillus (ACIDOPHILUS PO) Take by mouth daily.      . cetirizine (ZYRTEC) 10 MG tablet Take 10 mg by mouth daily.       No current facility-administered medications on file prior to visit.    BP 130/74  Pulse 72  Temp(Src) 97.9 F (36.6 C) (Oral)  Resp 20  Ht 5\' 3"  (1.6 m)  Wt 177 lb (80.287 kg)  BMI 31.36 kg/m2  SpO2 98%       Review of Systems  Constitutional: Negative.   HENT: Negative for congestion, dental problem, hearing loss, rhinorrhea, sinus pressure, sore throat and tinnitus.   Eyes: Negative for pain, discharge and visual disturbance.  Respiratory: Negative for cough and shortness of breath.   Cardiovascular: Negative for chest pain, palpitations and leg swelling.  Gastrointestinal: Negative for nausea, vomiting, abdominal pain, diarrhea, constipation, blood in stool and abdominal distention.  Genitourinary: Negative for dysuria, urgency, frequency, hematuria, flank pain, vaginal bleeding, vaginal discharge, difficulty urinating, vaginal pain and pelvic pain.  Musculoskeletal: Negative for arthralgias, gait problem and joint swelling.       Right buttock, and left shoulder pain  Skin: Negative for rash.  Neurological: Negative for dizziness, syncope, speech difficulty, weakness, numbness and headaches.  Hematological: Negative  for adenopathy.  Psychiatric/Behavioral: Negative for behavioral problems, dysphoric mood and agitation. The patient is not nervous/anxious.        Objective:   Physical Exam  Constitutional: She is oriented to person, place, and time. She appears well-developed and well-nourished.  HENT:  Head: Normocephalic.  Right Ear: External ear normal.  Left Ear: External ear normal.  Mouth/Throat: Oropharynx is clear and moist.  Eyes: Conjunctivae and EOM are normal. Pupils are equal, round, and reactive to light.  Neck: Normal range of motion. Neck supple. No thyromegaly present.  Cardiovascular: Normal rate, regular rhythm, normal heart sounds and intact distal pulses.   Pulmonary/Chest: Effort normal and breath sounds normal.  Abdominal: Soft. Bowel sounds are normal. She exhibits no mass. There is no tenderness.  Musculoskeletal: Normal range of motion.  Negative straight leg test Range of motion of both hips intact  Lymphadenopathy:    She has no cervical adenopathy.  Neurological: She is alert and oriented to person, place, and time.  Skin: Skin is warm and dry. No rash noted.  Psychiatric: She has a normal mood and affect. Her behavior is normal.          Assessment & Plan:   Right low back pain.  Will treat with short-term anti-inflammatories and muscle relaxers, which have been helpful in the past.  She'll resume her stretching exercises at her local gym

## 2013-07-24 ENCOUNTER — Other Ambulatory Visit: Payer: Self-pay | Admitting: Internal Medicine

## 2013-07-24 NOTE — Telephone Encounter (Signed)
Caller: Brianna Mack/Patient; Phone: [phone]; Reason for Call: Pt was there April 20 for back pain.  Prescribed flexoril.  Pt is so sleepy can not get over effects for 12hours or more.  Was wondering if can switch to methocarbamol.  Pt has taken before (several yrs ago) and pt was able to function.

## 2013-07-25 NOTE — Telephone Encounter (Signed)
Please see message and advise 

## 2013-07-25 NOTE — Telephone Encounter (Signed)
Left message on voicemail to call office on home and mobile.  

## 2013-07-25 NOTE — Telephone Encounter (Signed)
OK  750 mg  , one every 8 hours as needed

## 2013-07-26 MED ORDER — METHOCARBAMOL 750 MG PO TABS: 750.0000 mg | ORAL_TABLET | Freq: Three times a day (TID) | ORAL | Status: DC | PRN

## 2013-07-26 NOTE — Telephone Encounter (Signed)
Spoke to pt told her Dr. Raliegh Ip will give her Rx for [facility]. Rx sent to pharmacy. Pt verbalized understanding.

## 2013-07-27 ENCOUNTER — Telehealth: Payer: Self-pay | Admitting: Internal Medicine

## 2013-07-27 NOTE — Telephone Encounter (Signed)
[facility] sent a denial letter for Methocarbamol.  Pt must try and fail all of the following medications:  Celebrex, Diclofenac potassium, Diflunisal, Etodolac, Ibuprofen, Ketoprofen, Nabumetone, Naproxen regular/delayed release, and Tizanidine.  OR there is/are specific medical reason(s) why the alternative medications are not appropriate to treat the pt's medical condition.  I am sending the denial letter back for review before it is sent to scanning.

## 2013-07-27 NOTE — Telephone Encounter (Signed)
Spoke to pt, told her Rx for Methocarbamol was denied and can send Rx for Tizanidine. Pt said she was aware but she had the Rx filled for Methocarbamol filled and paid out of pocket wants to see if it works. Told pt okay that is up to her let me know if you need to change to another medication. Pt verbalized understanding.

## 2013-07-27 NOTE — Telephone Encounter (Signed)
Please call in a prescription for Tizanidine

## 2013-07-27 NOTE — Telephone Encounter (Signed)
Please see message and advise 

## 2013-09-20 ENCOUNTER — Encounter: Payer: Self-pay | Admitting: Gynecology

## 2013-09-22 ENCOUNTER — Ambulatory Visit (INDEPENDENT_AMBULATORY_CARE_PROVIDER_SITE_OTHER): Payer: Medicare Other | Admitting: Gynecology

## 2013-09-22 ENCOUNTER — Encounter: Payer: Self-pay | Admitting: Gynecology

## 2013-09-22 VITALS — BP 120/78 | Ht 63.0 in | Wt 177.0 lb

## 2013-09-22 DIAGNOSIS — N952 Postmenopausal atrophic vaginitis: Secondary | ICD-10-CM

## 2013-09-22 DIAGNOSIS — F341 Dysthymic disorder: Secondary | ICD-10-CM

## 2013-09-22 DIAGNOSIS — Z79890 Hormone replacement therapy (postmenopausal): Secondary | ICD-10-CM

## 2013-09-22 MED ORDER — ALPRAZOLAM 0.25 MG PO TABS: 0.2500 mg | ORAL_TABLET | Freq: Every evening | ORAL | Status: DC | PRN

## 2013-09-22 MED ORDER — CITALOPRAM HYDROBROMIDE 20 MG PO TABS: 20.0000 mg | ORAL_TABLET | Freq: Every day | ORAL | Status: DC

## 2013-09-22 MED ORDER — ESTRADIOL 0.5 MG PO TABS: 0.5000 mg | ORAL_TABLET | Freq: Every day | ORAL | Status: DC

## 2013-09-22 NOTE — Progress Notes (Signed)
Brianna Mack 28-Jul-1945 [id number]        68 y.o.  G0P0 for annual exam.  Several issues noted below.  Past medical history,surgical history, problem list, medications, allergies, family history and social history were all reviewed and documented as reviewed in the EPIC chart.  ROS:  12 system ROS performed with pertinent positives and negatives included in the history, assessment and plan.   Additional significant findings :  None   Exam: [name] Counsellor Vitals:   09/22/13 1355  BP: 120/78  Height: 5\' 3"  (1.6 m)  Weight: 177 lb (80.287 kg)   General appearance:  Normal affect, orientation and appearance. Skin: Grossly normal HEENT: Without gross lesions.  No cervical or supraclavicular adenopathy. Thyroid normal.  Lungs:  Clear without wheezing, rales or rhonchi Cardiac: RR, without RMG Abdominal:  Soft, nontender, without masses, guarding, rebound, organomegaly or hernia Breasts:  Examined lying and sitting without masses, retractions, discharge or axillary adenopathy. Pelvic:  Ext/BUS/vagina with generalized atrophic changes  Adnexa  Without masses or tenderness    Anus and perineum  Normal   Rectovaginal  Normal sphincter tone without palpated masses or tenderness.    Assessment/Plan:  68 y.o. G0P0 female for annual exam.   1. Postmenopausal/atrophic genital changes status post TAH/BSO for endometriosis/leiomyoma. On estradiol 0.5 mg by mouth daily.  I again reviewed the whole issue of HRT with her to include the WHI study with increased risk of stroke, heart attack, DVT and breast cancer. The ACOG and NAMS statements for lowest dose for the shortest period of time reviewed. Transdermal versus oral first-pass effect benefit discussed. Patient understands the issues and risks and wants to continue at this point and I refilled her x1 year. 2. Anxiety. Patient continues on Celexa 20 mg daily and Xanax 0.25 mg when necessary due to the anxiety associated with her husbands  stroke 2 years ago in dealing with the issues of his disability. She is comfortable continuing and I refilled her x1 year. 3. Pap smear 2012. No Pap smear done today. No history of abnormal Pap smears previously. Over the age of 55 and status post hysterectomy for benign indications. Per current screening guidelines we both agree to stop screaming and she is comfortable with this. 4. Mammography 09/2013. Continue with annual mammography. SBE monthly reviewed. 5. DEXA 2011 normal. Plan repeat at 5 year interval next year. Increased calcium vitamin D reviewed. 6. Colonoscopy 2009 with reported repeat interval 10 years. 7. Health maintenance. No routine blood work done as this is done at her primary physician's office. Followup one year, sooner as needed.   Note: This document was prepared with digital dictation and possible smart phrase technology. Any transcriptional errors that result from this process are unintentional.   Anastasio Auerbach MD, 2:26 PM 09/22/2013

## 2013-09-22 NOTE — Patient Instructions (Signed)
Followup in one year, sooner as needed.  You may obtain a copy of any labs that were done today by logging onto MyChart as outlined in the instructions provided with your AVS (after visit summary). The office will not call with normal lab results but certainly if there are any significant abnormalities then we will contact you.   Health Maintenance, Female A healthy lifestyle and preventative care can promote health and wellness.  Maintain regular health, dental, and eye exams.  Eat a healthy diet. Foods like vegetables, fruits, whole grains, low-fat dairy products, and lean protein foods contain the nutrients you need without too many calories. Decrease your intake of foods high in solid fats, added sugars, and salt. Get information about a proper diet from your caregiver, if necessary.  Regular physical exercise is one of the most important things you can do for your health. Most adults should get at least 150 minutes of moderate-intensity exercise (any activity that increases your heart rate and causes you to sweat) each week. In addition, most adults need muscle-strengthening exercises on 2 or more days a week.   Maintain a healthy weight. The body mass index (BMI) is a screening tool to identify possible weight problems. It provides an estimate of body fat based on height and weight. Your caregiver can help determine your BMI, and can help you achieve or maintain a healthy weight. For adults 20 years and older:  A BMI below 18.5 is considered underweight.  A BMI of 18.5 to 24.9 is normal.  A BMI of 25 to 29.9 is considered overweight.  A BMI of 30 and above is considered obese.  Maintain normal blood lipids and cholesterol by exercising and minimizing your intake of saturated fat. Eat a balanced diet with plenty of fruits and vegetables. Blood tests for lipids and cholesterol should begin at age 41 and be repeated every 5 years. If your lipid or cholesterol levels are high, you are over  50, or you are a high risk for heart disease, you may need your cholesterol levels checked more frequently.Ongoing high lipid and cholesterol levels should be treated with medicines if diet and exercise are not effective.  If you smoke, find out from your caregiver how to quit. If you do not use tobacco, do not start.  Lung cancer screening is recommended for adults aged 49 80 years who are at high risk for developing lung cancer because of a history of smoking. Yearly low-dose computed tomography (CT) is recommended for people who have at least a 30-pack-year history of smoking and are a current smoker or have quit within the past 15 years. A pack year of smoking is smoking an average of 1 pack of cigarettes a day for 1 year (for example: 1 pack a day for 30 years or 2 packs a day for 15 years). Yearly screening should continue until the smoker has stopped smoking for at least 15 years. Yearly screening should also be stopped for people who develop a health problem that would prevent them from having lung cancer treatment.  If you are pregnant, do not drink alcohol. If you are breastfeeding, be very cautious about drinking alcohol. If you are not pregnant and choose to drink alcohol, do not exceed 1 drink per day. One drink is considered to be 12 ounces (355 mL) of beer, 5 ounces (148 mL) of wine, or 1.5 ounces (44 mL) of liquor.  Avoid use of street drugs. Do not share needles with anyone. Ask for help  if you need support or instructions about stopping the use of drugs.  High blood pressure causes heart disease and increases the risk of stroke. Blood pressure should be checked at least every 1 to 2 years. Ongoing high blood pressure should be treated with medicines, if weight loss and exercise are not effective.  If you are 55 to 67 years old, ask your caregiver if you should take aspirin to prevent strokes.  Diabetes screening involves taking a blood sample to check your fasting blood sugar level.  This should be done once every 3 years, after age 45, if you are within normal weight and without risk factors for diabetes. Testing should be considered at a younger age or be carried out more frequently if you are overweight and have at least 1 risk factor for diabetes.  Breast cancer screening is essential preventative care for women. You should practice "breast self-awareness." This means understanding the normal appearance and feel of your breasts and may include breast self-examination. Any changes detected, no matter how small, should be reported to a caregiver. Women in their 20s and 30s should have a clinical breast exam (CBE) by a caregiver as part of a regular health exam every 1 to 3 years. After age 40, women should have a CBE every year. Starting at age 40, women should consider having a mammogram (breast X-ray) every year. Women who have a family history of breast cancer should talk to their caregiver about genetic screening. Women at a high risk of breast cancer should talk to their caregiver about having an MRI and a mammogram every year.  Breast cancer gene (BRCA)-related cancer risk assessment is recommended for women who have family members with BRCA-related cancers. BRCA-related cancers include breast, ovarian, tubal, and peritoneal cancers. Having family members with these cancers may be associated with an increased risk for harmful changes (mutations) in the breast cancer genes BRCA1 and BRCA2. Results of the assessment will determine the need for genetic counseling and BRCA1 and BRCA2 testing.  The Pap test is a screening test for cervical cancer. Women should have a Pap test starting at age 21. Between ages 21 and 29, Pap tests should be repeated every 2 years. Beginning at age 30, you should have a Pap test every 3 years as long as the past 3 Pap tests have been normal. If you had a hysterectomy for a problem that was not cancer or a condition that could lead to cancer, then you no  longer need Pap tests. If you are between ages 65 and 70, and you have had normal Pap tests going back 10 years, you no longer need Pap tests. If you have had past treatment for cervical cancer or a condition that could lead to cancer, you need Pap tests and screening for cancer for at least 20 years after your treatment. If Pap tests have been discontinued, risk factors (such as a new sexual partner) need to be reassessed to determine if screening should be resumed. Some women have medical problems that increase the chance of getting cervical cancer. In these cases, your caregiver may recommend more frequent screening and Pap tests.  The human papillomavirus (HPV) test is an additional test that may be used for cervical cancer screening. The HPV test looks for the virus that can cause the cell changes on the cervix. The cells collected during the Pap test can be tested for HPV. The HPV test could be used to screen women aged 30 years and older, and   should be used in women of any age who have unclear Pap test results. After the age of 30, women should have HPV testing at the same frequency as a Pap test.  Colorectal cancer can be detected and often prevented. Most routine colorectal cancer screening begins at the age of 50 and continues through age 75. However, your caregiver may recommend screening at an earlier age if you have risk factors for colon cancer. On a yearly basis, your caregiver may provide home test kits to check for hidden blood in the stool. Use of a small camera at the end of a tube, to directly examine the colon (sigmoidoscopy or colonoscopy), can detect the earliest forms of colorectal cancer. Talk to your caregiver about this at age 50, when routine screening begins. Direct examination of the colon should be repeated every 5 to 10 years through age 75, unless early forms of pre-cancerous polyps or small growths are found.  Hepatitis C blood testing is recommended for all people born from  1945 through 1965 and any individual with known risks for hepatitis C.  Practice safe sex. Use condoms and avoid high-risk sexual practices to reduce the spread of sexually transmitted infections (STIs). Sexually active women aged 25 and younger should be checked for Chlamydia, which is a common sexually transmitted infection. Older women with new or multiple partners should also be tested for Chlamydia. Testing for other STIs is recommended if you are sexually active and at increased risk.  Osteoporosis is a disease in which the bones lose minerals and strength with aging. This can result in serious bone fractures. The risk of osteoporosis can be identified using a bone density scan. Women ages 65 and over and women at risk for fractures or osteoporosis should discuss screening with their caregivers. Ask your caregiver whether you should be taking a calcium supplement or vitamin D to reduce the rate of osteoporosis.  Menopause can be associated with physical symptoms and risks. Hormone replacement therapy is available to decrease symptoms and risks. You should talk to your caregiver about whether hormone replacement therapy is right for you.  Use sunscreen. Apply sunscreen liberally and repeatedly throughout the day. You should seek shade when your shadow is shorter than you. Protect yourself by wearing long sleeves, pants, a wide-brimmed hat, and sunglasses year round, whenever you are outdoors.  Notify your caregiver of new moles or changes in moles, especially if there is a change in shape or color. Also notify your caregiver if a mole is larger than the size of a pencil eraser.  Stay current with your immunizations. Document Released: 08/18/2010 Document Revised: 05/30/2012 Document Reviewed: 08/18/2010 [employer] Patient Information 2014 [employer].   

## 2013-09-23 LAB — URINALYSIS W MICROSCOPIC + REFLEX CULTURE
BACTERIA UA: NONE SEEN
Bilirubin Urine: NEGATIVE
CASTS: NONE SEEN
Crystals: NONE SEEN
Glucose, UA: NEGATIVE mg/dL
Hgb urine dipstick: NEGATIVE
KETONES UR: NEGATIVE mg/dL
Leukocytes, UA: NEGATIVE
Nitrite: NEGATIVE
PH: 6 (ref 5.0–8.0)
Protein, ur: NEGATIVE mg/dL
SQUAMOUS EPITHELIAL / LPF: NONE SEEN
Specific Gravity, Urine: 1.007 (ref 1.005–1.030)
Urobilinogen, UA: 0.2 mg/dL (ref 0.0–1.0)

## 2013-11-29 ENCOUNTER — Ambulatory Visit: Payer: Self-pay | Admitting: Internal Medicine

## 2013-12-01 ENCOUNTER — Ambulatory Visit (INDEPENDENT_AMBULATORY_CARE_PROVIDER_SITE_OTHER): Payer: Medicare Other

## 2013-12-01 DIAGNOSIS — Z23 Encounter for immunization: Secondary | ICD-10-CM

## 2013-12-26 ENCOUNTER — Other Ambulatory Visit: Payer: Self-pay | Admitting: Internal Medicine

## 2014-02-21 DIAGNOSIS — L821 Other seborrheic keratosis: Secondary | ICD-10-CM | POA: Diagnosis not present

## 2014-02-21 DIAGNOSIS — L820 Inflamed seborrheic keratosis: Secondary | ICD-10-CM | POA: Diagnosis not present

## 2014-03-08 DIAGNOSIS — M25512 Pain in left shoulder: Secondary | ICD-10-CM | POA: Diagnosis not present

## 2014-03-20 DIAGNOSIS — M25512 Pain in left shoulder: Secondary | ICD-10-CM | POA: Diagnosis not present

## 2014-03-23 DIAGNOSIS — M25512 Pain in left shoulder: Secondary | ICD-10-CM | POA: Diagnosis not present

## 2014-03-26 DIAGNOSIS — M25512 Pain in left shoulder: Secondary | ICD-10-CM | POA: Diagnosis not present

## 2014-03-28 DIAGNOSIS — M25512 Pain in left shoulder: Secondary | ICD-10-CM | POA: Diagnosis not present

## 2014-04-04 DIAGNOSIS — M25512 Pain in left shoulder: Secondary | ICD-10-CM | POA: Diagnosis not present

## 2014-04-09 ENCOUNTER — Other Ambulatory Visit: Payer: Self-pay | Admitting: Dermatology

## 2014-04-09 DIAGNOSIS — D485 Neoplasm of uncertain behavior of skin: Secondary | ICD-10-CM | POA: Diagnosis not present

## 2014-04-09 DIAGNOSIS — M25512 Pain in left shoulder: Secondary | ICD-10-CM | POA: Diagnosis not present

## 2014-04-09 DIAGNOSIS — L814 Other melanin hyperpigmentation: Secondary | ICD-10-CM | POA: Diagnosis not present

## 2014-04-09 DIAGNOSIS — L821 Other seborrheic keratosis: Secondary | ICD-10-CM | POA: Diagnosis not present

## 2014-04-09 DIAGNOSIS — L570 Actinic keratosis: Secondary | ICD-10-CM | POA: Diagnosis not present

## 2014-04-09 DIAGNOSIS — L84 Corns and callosities: Secondary | ICD-10-CM | POA: Diagnosis not present

## 2014-04-11 ENCOUNTER — Telehealth: Payer: Self-pay | Admitting: Internal Medicine

## 2014-04-11 NOTE — Telephone Encounter (Signed)
[facility] - Client [place] [facility] Patient Name: Brianna Mack DOB: September 20, 1945 Initial Comment Caller States she has temp of 101.5, headache, runny nose, cough, nausea. taking tylenol for the fever. Nurse Assessment Nurse: Donalynn Furlong, RN, Myna Hidalgo Date/Time Eilene Ghazi Time): 04/11/2014 9:42:01 AM Confirm and document reason for call. If symptomatic, describe symptoms. ---Caller States she has temp of 101.5, headache, runny nose, cough, nausea. taking tylenol for the fever x 24 hrs Has the patient traveled out of the country within the last 30 days? ---No Does the patient require triage? ---Yes Related visit to physician within the last 2 weeks? ---No Does the PT have any chronic conditions? (i.e. diabetes, asthma, etc.) ---No Guidelines Guideline Title Affirmed Question Affirmed Notes Common Cold Cold with no complications (all triage questions negative) Final Disposition User Bodega, RN, Myna Hidalgo

## 2014-04-12 ENCOUNTER — Ambulatory Visit: Payer: Self-pay | Admitting: Internal Medicine

## 2014-04-13 ENCOUNTER — Encounter: Payer: Self-pay | Admitting: Family Medicine

## 2014-04-13 ENCOUNTER — Ambulatory Visit (INDEPENDENT_AMBULATORY_CARE_PROVIDER_SITE_OTHER): Payer: Medicare Other | Admitting: Family Medicine

## 2014-04-13 ENCOUNTER — Telehealth: Payer: Self-pay | Admitting: Internal Medicine

## 2014-04-13 VITALS — BP 145/73 | HR 72 | Temp 98.6°F | Ht 63.0 in | Wt 176.0 lb

## 2014-04-13 DIAGNOSIS — J0100 Acute maxillary sinusitis, unspecified: Secondary | ICD-10-CM | POA: Diagnosis not present

## 2014-04-13 MED ORDER — AZITHROMYCIN 250 MG PO TABS: ORAL_TABLET | ORAL | Status: DC

## 2014-04-13 NOTE — Progress Notes (Signed)
   Subjective:    Patient ID: Brianna Mack, female    DOB: 05-18-1945, 69 y.o.   MRN: [id number]  HPI Here for 6 days of sinus pressure, PND, ST and a dry cough.    Review of Systems  Constitutional: Negative.   HENT: Positive for congestion, postnasal drip and sinus pressure.   Eyes: Negative.   Respiratory: Positive for cough.        Objective:   Physical Exam  Constitutional: She appears well-developed and well-nourished.  HENT:  Right Ear: External ear normal.  Left Ear: External ear normal.  Nose: Nose normal.  Mouth/Throat: Oropharynx is clear and moist.  Eyes: Conjunctivae are normal.  Pulmonary/Chest: Effort normal and breath sounds normal.  Lymphadenopathy:    She has no cervical adenopathy.          Assessment & Plan:  Add Mucinex.

## 2014-04-13 NOTE — Progress Notes (Signed)
Pre visit review using our clinic review tool, if applicable. No additional management support is needed unless otherwise documented below in the visit note. 

## 2014-04-13 NOTE — Telephone Encounter (Signed)
appt scheduled

## 2014-04-13 NOTE — Telephone Encounter (Signed)
Patient Name: Brianna Mack DOB: September 24, 1945 Initial Comment Caller thinks she has what is going around: Runy nose, dry cough, sometimes wet, and a fever since Tuesday, This AM is 99.5 Nurse Assessment Nurse: Marcelline Deist, RN, Lynda Date/Time (Eastern Time): 04/13/2014 10:09:54 AM Confirm and document reason for call. If symptomatic, describe symptoms. ---Caller thinks she has what is going around. She has runny nose, dry cough, sometimes wet, and a fever/chills since Tuesday, This AM it is 99.5. Taking Mucinex, helps some. Has the patient traveled out of the country within the last 30 days? ---Not Applicable Does the patient require triage? ---Yes Related visit to physician within the last 2 weeks? ---No Does the PT have any chronic conditions? (i.e. diabetes, asthma, etc.) ---Yes List chronic conditions. ---IBS Guidelines Guideline Title Affirmed Question Affirmed Notes Cough - Acute Non-Productive SEVERE coughing spells (e.g., whooping sound after coughing, vomiting after coughing) Final Disposition User See Physician within [place], RN, Kermit Balo Comments Caller asked about taking Mucinex & Benadryl together. Referenced Drugs.com & found no interactions, however told pt. that Benadryl has a sedative effect.

## 2014-04-19 DIAGNOSIS — M25512 Pain in left shoulder: Secondary | ICD-10-CM | POA: Diagnosis not present

## 2014-04-25 DIAGNOSIS — M25512 Pain in left shoulder: Secondary | ICD-10-CM | POA: Diagnosis not present

## 2014-06-22 ENCOUNTER — Other Ambulatory Visit (INDEPENDENT_AMBULATORY_CARE_PROVIDER_SITE_OTHER): Payer: Medicare Other

## 2014-06-22 DIAGNOSIS — Z0000 Encounter for general adult medical examination without abnormal findings: Secondary | ICD-10-CM | POA: Diagnosis not present

## 2014-06-22 DIAGNOSIS — Z79899 Other long term (current) drug therapy: Secondary | ICD-10-CM | POA: Diagnosis not present

## 2014-06-22 LAB — HEPATIC FUNCTION PANEL
ALBUMIN: 3.9 g/dL (ref 3.5–5.2)
ALK PHOS: 61 U/L (ref 39–117)
ALT: 11 U/L (ref 0–35)
AST: 16 U/L (ref 0–37)
Bilirubin, Direct: 0 mg/dL (ref 0.0–0.3)
TOTAL PROTEIN: 6.9 g/dL (ref 6.0–8.3)
Total Bilirubin: 0.4 mg/dL (ref 0.2–1.2)

## 2014-06-22 LAB — CBC WITH DIFFERENTIAL/PLATELET
BASOS ABS: 0.1 10*3/uL (ref 0.0–0.1)
Basophils Relative: 1 % (ref 0.0–3.0)
EOS ABS: 0.2 10*3/uL (ref 0.0–0.7)
Eosinophils Relative: 2.9 % (ref 0.0–5.0)
HCT: 42.2 % (ref 36.0–46.0)
Hemoglobin: 14.1 g/dL (ref 12.0–15.0)
LYMPHS PCT: 17.9 % (ref 12.0–46.0)
Lymphs Abs: 0.9 10*3/uL (ref 0.7–4.0)
MCHC: 33.4 g/dL (ref 30.0–36.0)
MCV: 90.2 fl (ref 78.0–100.0)
Monocytes Absolute: 0.8 10*3/uL (ref 0.1–1.0)
Monocytes Relative: 14.8 % — ABNORMAL HIGH (ref 3.0–12.0)
NEUTROS PCT: 63.4 % (ref 43.0–77.0)
Neutro Abs: 3.4 10*3/uL (ref 1.4–7.7)
PLATELETS: 232 10*3/uL (ref 150.0–400.0)
RBC: 4.68 Mil/uL (ref 3.87–5.11)
RDW: 14.7 % (ref 11.5–15.5)
WBC: 5.3 10*3/uL (ref 4.0–10.5)

## 2014-06-22 LAB — TSH: TSH: 4.48 u[IU]/mL (ref 0.35–4.50)

## 2014-06-22 LAB — POCT URINALYSIS DIPSTICK
Bilirubin, UA: NEGATIVE
Glucose, UA: NEGATIVE
Ketones, UA: NEGATIVE
LEUKOCYTES UA: NEGATIVE
NITRITE UA: NEGATIVE
PH UA: 6
Protein, UA: NEGATIVE
Spec Grav, UA: 1.015
Urobilinogen, UA: 0.2

## 2014-06-22 LAB — BASIC METABOLIC PANEL
BUN: 14 mg/dL (ref 6–23)
CALCIUM: 9.2 mg/dL (ref 8.4–10.5)
CO2: 27 mEq/L (ref 19–32)
Chloride: 103 mEq/L (ref 96–112)
Creatinine, Ser: 0.69 mg/dL (ref 0.40–1.20)
GFR: 89.96 mL/min (ref >60.00)
GLUCOSE: 86 mg/dL (ref 70–99)
POTASSIUM: 4.9 meq/L (ref 3.5–5.1)
Sodium: 139 mEq/L (ref 135–145)

## 2014-06-22 LAB — LIPID PANEL
CHOL/HDL RATIO: 3
CHOLESTEROL: 212 mg/dL — AB (ref 0–200)
HDL: 81.3 mg/dL (ref >39.00)
LDL CALC: 105 mg/dL — AB (ref 0–99)
NonHDL: 130.7
Triglycerides: 129 mg/dL (ref 0.0–149.0)
VLDL: 25.8 mg/dL (ref 0.0–40.0)

## 2014-06-27 ENCOUNTER — Other Ambulatory Visit (HOSPITAL_COMMUNITY): Payer: Self-pay

## 2014-06-27 ENCOUNTER — Inpatient Hospital Stay (HOSPITAL_COMMUNITY)
Admission: EM | Admit: 2014-06-27 | Discharge: 2014-06-30 | DRG: 483 | Disposition: A | Discharge status: Home Health | Payer: Medicare Other | Attending: Orthopedic Surgery | Admitting: Orthopedic Surgery

## 2014-06-27 ENCOUNTER — Emergency Department (HOSPITAL_COMMUNITY): Payer: Medicare Other

## 2014-06-27 ENCOUNTER — Encounter (HOSPITAL_COMMUNITY): Payer: Self-pay | Admitting: Emergency Medicine

## 2014-06-27 DIAGNOSIS — Z809 Family history of malignant neoplasm, unspecified: Secondary | ICD-10-CM | POA: Diagnosis not present

## 2014-06-27 DIAGNOSIS — W010XXA Fall on same level from slipping, tripping and stumbling without subsequent striking against object, initial encounter: Secondary | ICD-10-CM | POA: Diagnosis not present

## 2014-06-27 DIAGNOSIS — Z87891 Personal history of nicotine dependence: Secondary | ICD-10-CM

## 2014-06-27 DIAGNOSIS — Z8249 Family history of ischemic heart disease and other diseases of the circulatory system: Secondary | ICD-10-CM

## 2014-06-27 DIAGNOSIS — Y93K1 Activity, walking an animal: Secondary | ICD-10-CM | POA: Diagnosis not present

## 2014-06-27 DIAGNOSIS — Z825 Family history of asthma and other chronic lower respiratory diseases: Secondary | ICD-10-CM

## 2014-06-27 DIAGNOSIS — R52 Pain, unspecified: Secondary | ICD-10-CM | POA: Diagnosis not present

## 2014-06-27 DIAGNOSIS — K589 Irritable bowel syndrome without diarrhea: Secondary | ICD-10-CM | POA: Diagnosis not present

## 2014-06-27 DIAGNOSIS — M25511 Pain in right shoulder: Secondary | ICD-10-CM | POA: Diagnosis not present

## 2014-06-27 DIAGNOSIS — Z885 Allergy status to narcotic agent status: Secondary | ICD-10-CM | POA: Diagnosis not present

## 2014-06-27 DIAGNOSIS — W19XXXA Unspecified fall, initial encounter: Secondary | ICD-10-CM

## 2014-06-27 DIAGNOSIS — S4290XA Fracture of unspecified shoulder girdle, part unspecified, initial encounter for closed fracture: Secondary | ICD-10-CM | POA: Diagnosis present

## 2014-06-27 DIAGNOSIS — T07XXXA Unspecified multiple injuries, initial encounter: Secondary | ICD-10-CM

## 2014-06-27 DIAGNOSIS — Z01818 Encounter for other preprocedural examination: Secondary | ICD-10-CM | POA: Diagnosis not present

## 2014-06-27 DIAGNOSIS — Z23 Encounter for immunization: Secondary | ICD-10-CM | POA: Diagnosis not present

## 2014-06-27 DIAGNOSIS — M1990 Unspecified osteoarthritis, unspecified site: Secondary | ICD-10-CM | POA: Diagnosis not present

## 2014-06-27 DIAGNOSIS — M79603 Pain in arm, unspecified: Secondary | ICD-10-CM | POA: Diagnosis not present

## 2014-06-27 DIAGNOSIS — Z833 Family history of diabetes mellitus: Secondary | ICD-10-CM | POA: Diagnosis not present

## 2014-06-27 DIAGNOSIS — S42291A Other displaced fracture of upper end of right humerus, initial encounter for closed fracture: Secondary | ICD-10-CM | POA: Diagnosis not present

## 2014-06-27 DIAGNOSIS — F419 Anxiety disorder, unspecified: Secondary | ICD-10-CM | POA: Diagnosis present

## 2014-06-27 DIAGNOSIS — Z881 Allergy status to other antibiotic agents status: Secondary | ICD-10-CM

## 2014-06-27 DIAGNOSIS — S42201A Unspecified fracture of upper end of right humerus, initial encounter for closed fracture: Secondary | ICD-10-CM | POA: Diagnosis not present

## 2014-06-27 DIAGNOSIS — F329 Major depressive disorder, single episode, unspecified: Secondary | ICD-10-CM | POA: Diagnosis not present

## 2014-06-27 DIAGNOSIS — S4291XA Fracture of right shoulder girdle, part unspecified, initial encounter for closed fracture: Secondary | ICD-10-CM | POA: Diagnosis not present

## 2014-06-27 DIAGNOSIS — Z90710 Acquired absence of both cervix and uterus: Secondary | ICD-10-CM

## 2014-06-27 DIAGNOSIS — Z882 Allergy status to sulfonamides status: Secondary | ICD-10-CM | POA: Diagnosis not present

## 2014-06-27 DIAGNOSIS — S43011A Anterior subluxation of right humerus, initial encounter: Secondary | ICD-10-CM | POA: Diagnosis not present

## 2014-06-27 DIAGNOSIS — G8918 Other acute postprocedural pain: Secondary | ICD-10-CM | POA: Diagnosis not present

## 2014-06-27 DIAGNOSIS — Z96611 Presence of right artificial shoulder joint: Secondary | ICD-10-CM | POA: Diagnosis not present

## 2014-06-27 DIAGNOSIS — Z471 Aftercare following joint replacement surgery: Secondary | ICD-10-CM | POA: Diagnosis not present

## 2014-06-27 DIAGNOSIS — S42401A Unspecified fracture of lower end of right humerus, initial encounter for closed fracture: Secondary | ICD-10-CM | POA: Diagnosis not present

## 2014-06-27 DIAGNOSIS — Z888 Allergy status to other drugs, medicaments and biological substances status: Secondary | ICD-10-CM | POA: Diagnosis not present

## 2014-06-27 DIAGNOSIS — S43014A Anterior dislocation of right humerus, initial encounter: Secondary | ICD-10-CM | POA: Diagnosis not present

## 2014-06-27 MED ORDER — TETANUS-DIPHTH-ACELL PERTUSSIS 5-2.5-18.5 LF-MCG/0.5 IM SUSP
0.5000 mL | Freq: Once | INTRAMUSCULAR | Status: AC
  Administered 2014-06-27: 0.5 mL via INTRAMUSCULAR
  Filled 2014-06-27: qty 0.5

## 2014-06-27 MED ORDER — OXYCODONE HCL 5 MG PO TABS: 5.0000 mg | ORAL_TABLET | ORAL | Status: DC | PRN

## 2014-06-27 MED ORDER — ONDANSETRON HCL 4 MG/2ML IJ SOLN
4.0000 mg | Freq: Once | INTRAMUSCULAR | Status: AC
  Administered 2014-06-27: 4 mg via INTRAVENOUS
  Filled 2014-06-27: qty 2

## 2014-06-27 MED ORDER — HYDROMORPHONE HCL 1 MG/ML IJ SOLN
0.5000 mg | Freq: Once | INTRAMUSCULAR | Status: AC
  Administered 2014-06-27: 0.5 mg via INTRAVENOUS
  Filled 2014-06-27: qty 1

## 2014-06-27 MED ORDER — DIPHENHYDRAMINE HCL 25 MG PO CAPS
25.0000 mg | ORAL_CAPSULE | Freq: Four times a day (QID) | ORAL | Status: DC | PRN
  Filled 2014-06-27: qty 1

## 2014-06-27 MED ORDER — HYDROMORPHONE HCL 1 MG/ML IJ SOLN: 1.0000 mg | Freq: Once | INTRAMUSCULAR | Status: DC

## 2014-06-27 MED ORDER — FENTANYL CITRATE (PF) 100 MCG/2ML IJ SOLN
50.0000 ug | Freq: Once | INTRAMUSCULAR | Status: AC
  Administered 2014-06-27: 50 ug via INTRAVENOUS
  Filled 2014-06-27: qty 2

## 2014-06-27 MED ORDER — PROPOFOL 10 MG/ML IV BOLUS
INTRAVENOUS | Status: AC | PRN
  Administered 2014-06-27: 30 mg via INTRAVENOUS

## 2014-06-27 MED ORDER — PROPOFOL 10 MG/ML IV BOLUS
INTRAVENOUS | Status: DC | PRN
  Administered 2014-06-27: 50 mg via INTRAVENOUS
  Administered 2014-06-28: 100 mg via INTRAVENOUS

## 2014-06-27 MED ORDER — PROPOFOL 10 MG/ML IV BOLUS
50.0000 mg | Freq: Once | INTRAVENOUS | Status: AC
  Administered 2014-06-27: 80 mg via INTRAVENOUS
  Filled 2014-06-27: qty 1

## 2014-06-27 MED ORDER — FENTANYL CITRATE (PF) 100 MCG/2ML IJ SOLN
50.0000 ug | Freq: Once | INTRAMUSCULAR | Status: AC
  Administered 2014-06-27: 50 ug via NASAL
  Filled 2014-06-27: qty 2

## 2014-06-27 NOTE — ED Provider Notes (Signed)
CSN: [id number]     Arrival date & time 06/27/14  1851 History   First MD Initiated Contact with Patient 06/27/14 1910     Chief Complaint  Patient presents with  . Fall  . Shoulder Injury     (Consider location/radiation/quality/duration/timing/severity/associated sxs/prior Treatment) HPI Comments: Brianna Mack is a 69 y.o. female with a PMHx of IBS, arthritis, depression, and anxiety, who presents to the ED with complaints of mechanical fall after tripping over an uneven piece of concrete while she was walking her dog, causing her to fall onto her right shoulder at approximately 6:30 PM. She reports 10/10 right shoulder pain which radiates down to the elbow, throbbing, constant, worse with movement, and unrelieved with ice. She reports that she feels as though her arm is "going to sleep" but denies any numbness or tingling. She reports that she hit her glasses on the concrete which caused a small abrasion to her right upper eyelid, but denies any other head injury or loss of consciousness, denies taking blood thinners, denies any headache or vision changes. Has a small abrasion to R elbow. Having difficulty moving her arm due to pain, but wiggles fingers without issue. She denies any chest pain or shortness of breath, abdominal pain, nausea, vomiting, weakness, back or neck pain, or any other injuries sustained during the fall. Unknown last tetanus.  Patient is a 69 y.o. female presenting with fall and shoulder injury. The history is provided by the patient. No language interpreter was used.  Fall This is a new problem. The current episode started today. The problem occurs rarely. The problem has been unchanged. Associated symptoms include arthralgias (R shoulder/arm). Pertinent negatives include no abdominal pain, chest pain, headaches, joint swelling, myalgias, nausea, neck pain, numbness, vomiting or weakness. Exacerbated by: arm movement. She has tried ice for the symptoms. The treatment  provided no relief.  Shoulder Injury Associated symptoms include arthralgias (R shoulder/arm). Pertinent negatives include no abdominal pain, chest pain, headaches, joint swelling, myalgias, nausea, neck pain, numbness, vomiting or weakness.    Past Medical History  Diagnosis Date  . Depression   . Anxiety   . IBS (irritable bowel syndrome)   . Arthritis    Past Surgical History  Procedure Laterality Date  . Cholecystectomy  1976  . Tonsillectomy  1954  . Dilation and curettage of uterus    . Hand surgery  04-02-2011    right  . Oophorectomy      BSO  . Abdominal hysterectomy  2001    BSO ; SLING PROCEDURE endometriosis/leiomyomata   Family History  Problem Relation Age of Onset  . Diabetes Mother   . Hypertension Mother   . Cancer Mother     Laurell Roof  . COPD Father    History  Substance Use Topics  . Smoking status: Former Smoker    Quit date: 08/03/1977  . Smokeless tobacco: Never Used  . Alcohol Use: 0.0 oz/week    0 Standard drinks or equivalent per week     Comment: occasionally   OB History    Gravida Para Term Preterm AB TAB SAB Ectopic Multiple Living   0              Review of Systems  HENT: Negative for facial swelling.        Small abrasion to R upper eyelid without swelling  Eyes: Negative for photophobia, pain and visual disturbance.  Respiratory: Negative for shortness of breath.   Cardiovascular: Negative for chest pain.  Gastrointestinal: Negative for nausea, vomiting and abdominal pain.  Genitourinary: Negative for difficulty urinating (no incontinence).  Musculoskeletal: Positive for arthralgias (R shoulder/arm). Negative for myalgias, back pain, joint swelling and neck pain.  Skin: Positive for wound (abrasions to R elbow and R upper eyelid). Negative for color change.  Allergic/Immunologic: Negative for immunocompromised state.  Neurological: Negative for dizziness, syncope, weakness, light-headedness, numbness and headaches.    Psychiatric/Behavioral: Negative for confusion.   10 Systems reviewed and are negative for acute change except as noted in the HPI.    Allergies  Augmentin; Cephalexin; Codeine sulfate; Fluconazole; Morphine; and Sulfonamide derivatives  Home Medications   Prior to Admission medications   Medication Sig Start Date End Date Taking? Authorizing Provider  Acetaminophen (TYLENOL ARTHRITIS PAIN PO) Take by mouth as needed.      Historical Provider, MD  ALPRAZolam Duanne Moron) 0.25 MG tablet Take 1 tablet (0.25 mg total) by mouth at bedtime as needed for anxiety. 09/22/13   Anastasio Auerbach, MD  azithromycin (ZITHROMAX) 250 MG tablet As directed 04/13/14   Laurey Morale, MD  Calcium Carbonate-Vitamin D (CALCIUM + D PO) Take by mouth.      Historical Provider, MD  cetirizine (ZYRTEC) 10 MG tablet Take 10 mg by mouth daily.    Historical Provider, MD  Cholecalciferol (VITAMIN D PO) Take by mouth.    Historical Provider, MD  citalopram (CELEXA) 20 MG tablet Take 1 tablet (20 mg total) by mouth daily. 09/22/13   Anastasio Auerbach, MD  estradiol (ESTRACE) 0.5 MG tablet Take 1 tablet (0.5 mg total) by mouth daily. 09/22/13   Anastasio Auerbach, MD  hyoscyamine (LEVBID) 0.375 MG 12 hr tablet Take 0.375 mg by mouth daily.     Historical Provider, MD  Lactobacillus (ACIDOPHILUS PO) Take by mouth daily.    Historical Provider, MD  meloxicam (MOBIC) 7.5 MG tablet TAKE 1 TABLET ONCE DAILY. Patient not taking: Reported on 04/13/2014 12/26/13   Marletta Lor, MD  methocarbamol (ROBAXIN) 750 MG tablet Take 1 tablet (750 mg total) by mouth every 8 (eight) hours as needed for muscle spasms. Patient not taking: Reported on 04/13/2014    Marletta Lor, MD   BP 139/77 mmHg  Pulse 82  Temp(Src) 97.9 F (36.6 C) (Oral)  Resp 20  SpO2 98% Physical Exam  Constitutional: She is oriented to person, place, and time. Vital signs are normal. She appears well-developed and well-nourished.  Non-toxic appearance. She  appears distressed.  Afebrile, nontoxic, very uncomfortable appearing  HENT:  Head: Normocephalic. Head is with abrasion. Head is without raccoon's eyes, without Battle's sign and without contusion.  Mouth/Throat: Oropharynx is clear and moist and mucous membranes are normal.  No scalp tenderness or crepitus, small abrasion to R upper eyelid with no ongoing bleeding, no contusions, no raccoon eyes or battle's sign. No facial TTP.  Eyes: Conjunctivae and EOM are normal. Pupils are equal, round, and reactive to light. Right eye exhibits no discharge. Left eye exhibits no discharge.  PERRL, EOMI, no nystagmus, no visual field deficits   Neck: Normal range of motion. Neck supple. Muscular tenderness present. No spinous process tenderness present. No rigidity. Normal range of motion present.    FROM intact without spinous process TTP, no bony stepoffs or deformities, + R sided paraspinous muscle TTP at the trapezius, with some slight muscle spasms. No rigidity or meningeal signs. No bruising or swelling.   Cardiovascular: Normal rate, regular rhythm, normal heart sounds and intact distal pulses.  Exam reveals no gallop and no friction rub.   No murmur heard. Pulmonary/Chest: Effort normal and breath sounds normal. No respiratory distress. She has no decreased breath sounds. She has no wheezes. She has no rhonchi. She has no rales.  Abdominal: Soft. Normal appearance and bowel sounds are normal. She exhibits no distension. There is no tenderness. There is no rigidity, no rebound and no guarding.  Musculoskeletal:       Right shoulder: She exhibits decreased range of motion (due to pain), tenderness, bony tenderness, spasm and decreased strength (due to pain). She exhibits no swelling, no deformity, no laceration and normal pulse.       Right elbow: She exhibits decreased range of motion (due to pain). She exhibits no swelling, no deformity and no laceration. Tenderness found. Medial epicondyle tenderness  noted.       Arms: R shoulder with limited ROM due to pain, with diffuse bony TTP in the jointline as well as down the humerus and into the elbow at the medial epicondyle, diffuse muscular TTP with spasms, no notable swelling/effusion, no obvious deformity although difficult to assess due to body habitus and positioning of her arm, unable to perform apley scratch, resisted int/ext rotation, or empty can test. Grip strength and sensation grossly intact in all extremities, distal pulses intact.  Wiggles all fingers with ease. Shoulder strength limited due to pain. R wrist and forearm without bony TTP or deformities.   Neurological: She is alert and oriented to person, place, and time. She has normal strength. No cranial nerve deficit or sensory deficit. GCS eye subscore is 4. GCS verbal subscore is 5. GCS motor subscore is 6.  nonfocal neuro exam, although slightly limited due to pain in R shoulder  Skin: Skin is warm and dry. Abrasion noted. No rash noted.  R upper eyelid abrasion Small abrasion to R elbow  Psychiatric: She has a normal mood and affect.  Nursing note and vitals reviewed.   ED Course  Procedures (including critical care time) Labs Review Labs Reviewed - No data to display  Imaging Review Dg Shoulder Right  06/27/2014   CLINICAL DATA:  Tripped and fell wall walking dog. Right shoulder injury and pain. Initial encounter.  EXAM: RIGHT SHOULDER - 2+ VIEW  COMPARISON:  None.  FINDINGS: Anterior dislocation of the humeral head is seen. There is also a fracture deformity involving the humeral neck, and another displaced fracture fragment along the inferior aspect of the glenoid which likely originates from the right humeral head.  IMPRESSION: Anterior dislocation of the right shoulder, with right humeral neck fracture.   Electronically Signed   By: Earle Gell M.D.   On: 06/27/2014 20:14   Dg Shoulder Right Port  06/27/2014   CLINICAL DATA:  Fall. Shoulder injury. Fracture dislocation  on previous study. Postreduction.  EXAM: PORTABLE RIGHT SHOULDER - 2+ VIEW  COMPARISON:  06/27/2014  FINDINGS: Persistent anterior dislocation of the right shoulder with fracture of the right humeral neck. Since the previous study, there is increased lateral displacement of the distal fracture fragment with respect to the humeral head fragment. Displaced fragment off of the inferior glenoid again demonstrated. Acromioclavicular and coracoclavicular spaces are maintained.  IMPRESSION: Fracture dislocation of the right shoulder as described. Persistent anterior dislocation. Increased displacement of fracture fragments since previous study.   Electronically Signed   By: Lucienne Capers M.D.   On: 06/27/2014 22:06   Dg Humerus Right  06/27/2014   CLINICAL DATA:  Acute right shoulder pain  after fall this evening. Initial encounter.  EXAM: RIGHT HUMERUS - 2+ VIEW  COMPARISON:  None.  FINDINGS: Anterior dislocation of proximal right humeral head is noted, with with severely displaced fracture involving a portion of the humeral head. This appears to be closed and posttraumatic.  IMPRESSION: Anterior dislocation of proximal right humeral head, with severely displaced fracture involving a portion of the proximal right humeral head.   Electronically Signed   By: Marijo Conception, M.D.   On: 06/27/2014 20:14     EKG Interpretation None      MDM   Final diagnoses:  Fall  Traumatic closed displaced fracture of right shoulder with anterior dislocation, initial encounter  Abrasions of multiple sites    69 y.o. female here with fall, R shoulder/arm pain, neurovascularly intact with bony TTP to shoulder, humerus, and elbow. Will get xrays. Small abrasion over R eyelid with intact glasses, no scalp crepitus, no LOC or HA, nonfocal neuro exam (although limited due to her R shoulder pain), doubt need for head CT. Will give pain meds and reassess after xray.  8:35 PM R shoulder xray showing dislocation with humeral  neck fx. Pt is a prior pt of Dr. Amedeo Plenty, therefore will consult him or his on-call provider in order to discuss whether they want [place] to attempt reduction of this or take her to surgery. Will reassess shortly.   8:58 PM Dr. Gladstone Lighter returning page, will come to see pt.   10:28 PM Post-reduction shows worsening fx. Dr. Gladstone Lighter will admit. Please see his note for further documentation of care and plan.  BP 178/76 mmHg  Pulse 72  Temp(Src) 97.9 F (36.6 C) (Oral)  Resp 18  SpO2 99%  Meds ordered this encounter  Medications  . fentaNYL (SUBLIMAZE) injection 50 mcg    Sig:   . Tdap (BOOSTRIX) injection 0.5 mL    Sig:   . propofol (DIPRIVAN) 10 mg/mL bolus/IV push 50 mg    Sig:   . fentaNYL (SUBLIMAZE) injection 50 mcg    Sig:   . diphenhydrAMINE (BENADRYL) capsule 25-50 mg    Sig:   . oxyCODONE (Oxy IR/ROXICODONE) immediate release tablet 5-10 mg    Sig:   . HYDROmorphone (DILAUDID) injection 0.5 mg    Sig:   . ondansetron (ZOFRAN) injection 4 mg    Sig:      Diallo Ponder Camprubi-Soms, [name] 06/27/14 2228  Wandra Arthurs, MD 06/30/14 [phone]

## 2014-06-27 NOTE — ED Notes (Signed)
Per EMS: Pt was walking her dog down the sidewalk.  Pt tripped, rolled on to rt shoulder.  Abrasion to rt eyelid from glasses.  Glasses still intact.  C/o rt shoulder pain.  Deformity not noted however, pt is larger in size.  Good grip strength.  No LOC.  No thinners.

## 2014-06-27 NOTE — H&P (Addendum)
Brianna Mack is an 69 y.o. female.   Chief Complaint: Pain in Right Shoilder HPI: [place] and injured her Right Shoulder  Past Medical History  Diagnosis Date  . Depression   . Anxiety   . IBS (irritable bowel syndrome)   . Arthritis     Past Surgical History  Procedure Laterality Date  . Cholecystectomy  1976  . Tonsillectomy  1954  . Dilation and curettage of uterus    . Hand surgery  04-02-2011    right  . Oophorectomy      BSO  . Abdominal hysterectomy  2001    BSO ; SLING PROCEDURE endometriosis/leiomyomata    Family History  Problem Relation Age of Onset  . Diabetes Mother   . Hypertension Mother   . Cancer Mother     Laurell Roof  . COPD Father    Social History:  reports that she quit smoking about 36 years ago. She has never used smokeless tobacco. She reports that she drinks alcohol. She reports that she does not use illicit drugs.  Allergies:  Allergies  Allergen Reactions  . Augmentin [Amoxicillin-Pot Clavulanate] Itching and Swelling    Headache  . Cephalexin Diarrhea  . Codeine Sulfate     REACTION: nausea, vomiting  . Fluconazole     REACTION: swelling and itching  . Morphine     REACTION: nausea, itching  . Sulfonamide Derivatives     REACTION: rash     (Not in a hospital admission)  No results found for this or any previous visit (from the past 48 hour(s)). Dg Shoulder Right  06/27/2014   CLINICAL DATA:  Tripped and fell wall walking dog. Right shoulder injury and pain. Initial encounter.  EXAM: RIGHT SHOULDER - 2+ VIEW  COMPARISON:  None.  FINDINGS: Anterior dislocation of the humeral head is seen. There is also a fracture deformity involving the humeral neck, and another displaced fracture fragment along the inferior aspect of the glenoid which likely originates from the right humeral head.  IMPRESSION: Anterior dislocation of the right shoulder, with right humeral neck fracture.   Electronically Signed   By: Earle Gell M.D.   On: 06/27/2014  20:14   Dg Humerus Right  06/27/2014   CLINICAL DATA:  Acute right shoulder pain after fall this evening. Initial encounter.  EXAM: RIGHT HUMERUS - 2+ VIEW  COMPARISON:  None.  FINDINGS: Anterior dislocation of proximal right humeral head is noted, with with severely displaced fracture involving a portion of the humeral head. This appears to be closed and posttraumatic.  IMPRESSION: Anterior dislocation of proximal right humeral head, with severely displaced fracture involving a portion of the proximal right humeral head.   Electronically Signed   By: Marijo Conception, M.D.   On: 06/27/2014 20:14    Review of Systems  Constitutional: Negative.   HENT: Negative.   Eyes: Negative.   Respiratory: Negative.   Cardiovascular: Negative.   Gastrointestinal: Negative.   Genitourinary: Negative.   Musculoskeletal: Positive for joint pain and falls.  Skin: Negative.   Neurological: Negative.   Endo/Heme/Allergies: Negative.   Psychiatric/Behavioral: Negative.     Blood pressure 139/77, pulse 82, temperature 97.9 F (36.6 C), temperature source Oral, resp. rate 20, SpO2 98 %. Physical Exam  Constitutional: She appears well-developed.  HENT:  Head: Normocephalic.  Eyes: Pupils are equal, round, and reactive to light.  Neck: Normal range of motion.  Cardiovascular: Normal rate.   Respiratory: Effort normal.  GI: Soft.  Musculoskeletal:  Anterior  Dislocation of Left Shoulder on the right   Neurological: She is alert.  Skin: Skin is warm.  Psychiatric: She has a normal mood and affect.     Assessment/Plan Closed Reduction of her Right Shoulder.Op note dictated. Slow ,gentle manipulation attempted under anesthesia and because of resistance,I stopped and did a Portable Xray and noted  th instability an displacement of the Humeral Head so no further manipulation was performed. Admitted and notified Dr. Veverly Fells who will do an open procedure tomorrow. Note I did discuss this with the patient prior  to the gentle attempt that the head could displace if unstable. Plain xrays were reviewed pre manipulation and fracture did not appear unstable but precautions were taken prior to the manipulation and that is why I did not try to go through the complete manipulation. She had a good radial pulse and normal motor functio in her hand with normal sensation in her hand and around her shoulder at the end of the procedure.  Mister Krahenbuhl A 06/27/2014, 9:24 PM

## 2014-06-27 NOTE — ED Notes (Signed)
Pt placed on 5 lead and 2L [place].

## 2014-06-28 ENCOUNTER — Encounter (HOSPITAL_COMMUNITY): Payer: Self-pay | Admitting: *Deleted

## 2014-06-28 ENCOUNTER — Inpatient Hospital Stay (HOSPITAL_COMMUNITY): Payer: Medicare Other | Admitting: Certified Registered"

## 2014-06-28 ENCOUNTER — Encounter (HOSPITAL_COMMUNITY)
Admission: EM | Disposition: A | Discharge status: Home Health | Payer: Self-pay | Source: Home / Self Care | Attending: Orthopedic Surgery

## 2014-06-28 ENCOUNTER — Inpatient Hospital Stay (HOSPITAL_COMMUNITY): Payer: Medicare Other

## 2014-06-28 DIAGNOSIS — S4290XA Fracture of unspecified shoulder girdle, part unspecified, initial encounter for closed fracture: Secondary | ICD-10-CM | POA: Diagnosis present

## 2014-06-28 HISTORY — PX: ORIF SHOULDER FRACTURE: SHX5035

## 2014-06-28 LAB — COMPREHENSIVE METABOLIC PANEL
ALT: 91 U/L — ABNORMAL HIGH (ref 14–54)
AST: 199 U/L — ABNORMAL HIGH (ref 15–41)
Albumin: 3.6 g/dL (ref 3.5–5.0)
Alkaline Phosphatase: 68 U/L (ref 38–126)
Anion gap: 9 (ref 5–15)
BUN: 8 mg/dL (ref 6–20)
CO2: 26 mmol/L (ref 22–32)
Calcium: 8 mg/dL — ABNORMAL LOW (ref 8.9–10.3)
Chloride: 104 mmol/L (ref 101–111)
Creatinine, Ser: 0.54 mg/dL (ref 0.44–1.00)
GFR calc Af Amer: >60 mL/min (ref >60)
GFR calc non Af Amer: >60 mL/min (ref >60)
Glucose, Bld: 133 mg/dL — ABNORMAL HIGH (ref 65–99)
Potassium: 4.9 mmol/L (ref 3.5–5.1)
Sodium: 139 mmol/L (ref 135–145)
Total Bilirubin: 0.6 mg/dL (ref 0.3–1.2)
Total Protein: 6.4 g/dL — ABNORMAL LOW (ref 6.5–8.1)

## 2014-06-28 LAB — CBC WITH DIFFERENTIAL/PLATELET
Basophils Absolute: 0 10*3/uL (ref 0.0–0.1)
Basophils Relative: 0 % (ref 0–1)
Eosinophils Absolute: 0 10*3/uL (ref 0.0–0.7)
Eosinophils Relative: 0 % (ref 0–5)
HCT: 41 % (ref 36.0–46.0)
Hemoglobin: 13 g/dL (ref 12.0–15.0)
Lymphocytes Relative: 15 % (ref 12–46)
Lymphs Abs: 1.3 10*3/uL (ref 0.7–4.0)
MCH: 29.5 pg (ref 26.0–34.0)
MCHC: 31.7 g/dL (ref 30.0–36.0)
MCV: 93.2 fL (ref 78.0–100.0)
Monocytes Absolute: 0.6 10*3/uL (ref 0.1–1.0)
Monocytes Relative: 8 % (ref 3–12)
Neutro Abs: 6.5 10*3/uL (ref 1.7–7.7)
Neutrophils Relative %: 77 % (ref 43–77)
Platelets: 224 10*3/uL (ref 150–400)
RBC: 4.4 MIL/uL (ref 3.87–5.11)
RDW: 13.9 % (ref 11.5–15.5)
WBC: 8.4 10*3/uL (ref 4.0–10.5)

## 2014-06-28 LAB — SURGICAL PCR SCREEN
MRSA, PCR: NEGATIVE
STAPHYLOCOCCUS AUREUS: NEGATIVE

## 2014-06-28 LAB — PROTIME-INR
INR: 0.98 (ref 0.00–1.49)
Prothrombin Time: 13.1 seconds (ref 11.6–15.2)

## 2014-06-28 SURGERY — OPEN REDUCTION INTERNAL FIXATION (ORIF) SHOULDER FRACTURE
Anesthesia: Regional | Site: Shoulder | Laterality: Right

## 2014-06-28 MED ORDER — HYDROMORPHONE HCL 1 MG/ML IJ SOLN
0.5000 mg | INTRAMUSCULAR | Status: DC | PRN
  Administered 2014-06-28: 1 mg via INTRAVENOUS
  Administered 2014-06-28: 0.5 mg via INTRAVENOUS
  Administered 2014-06-28 (×4): 1 mg via INTRAVENOUS
  Filled 2014-06-28 (×6): qty 1

## 2014-06-28 MED ORDER — BUPIVACAINE-EPINEPHRINE (PF) 0.5% -1:200000 IJ SOLN
INTRAMUSCULAR | Status: AC
  Filled 2014-06-28: qty 30

## 2014-06-28 MED ORDER — OXYCODONE HCL 5 MG PO TABS: 5.0000 mg | ORAL_TABLET | ORAL | Status: DC | PRN

## 2014-06-28 MED ORDER — LIDOCAINE HCL (PF) 2 % IJ SOLN
INTRAMUSCULAR | Status: DC | PRN
  Administered 2014-06-28: 20 mg via INTRADERMAL

## 2014-06-28 MED ORDER — ACETAMINOPHEN 650 MG RE SUPP: 650.0000 mg | Freq: Four times a day (QID) | RECTAL | Status: DC | PRN

## 2014-06-28 MED ORDER — METHOCARBAMOL 1000 MG/10ML IJ SOLN
500.0000 mg | Freq: Four times a day (QID) | INTRAVENOUS | Status: DC | PRN
  Filled 2014-06-28: qty 5

## 2014-06-28 MED ORDER — PHENYLEPHRINE 40 MCG/ML (10ML) SYRINGE FOR IV PUSH (FOR BLOOD PRESSURE SUPPORT)
PREFILLED_SYRINGE | INTRAVENOUS | Status: AC
Start: 2014-06-28 — End: 2014-06-28
  Filled 2014-06-28: qty 10

## 2014-06-28 MED ORDER — ONDANSETRON HCL 4 MG/2ML IJ SOLN
INTRAMUSCULAR | Status: AC
  Filled 2014-06-28: qty 2

## 2014-06-28 MED ORDER — PHENYLEPHRINE HCL 10 MG/ML IJ SOLN
10.0000 mg | INTRAVENOUS | Status: DC | PRN
  Administered 2014-06-28: 50 ug/min via INTRAVENOUS

## 2014-06-28 MED ORDER — BUPIVACAINE-EPINEPHRINE (PF) 0.25% -1:200000 IJ SOLN
INTRAMUSCULAR | Status: AC
  Filled 2014-06-28: qty 30

## 2014-06-28 MED ORDER — MENTHOL 3 MG MT LOZG: 1.0000 | LOZENGE | OROMUCOSAL | Status: DC | PRN

## 2014-06-28 MED ORDER — DEXAMETHASONE SODIUM PHOSPHATE 10 MG/ML IJ SOLN
INTRAMUSCULAR | Status: DC | PRN
  Administered 2014-06-28: 10 mg via INTRAVENOUS

## 2014-06-28 MED ORDER — HYOSCYAMINE SULFATE ER 0.375 MG PO TB12
375.0000 ug | ORAL_TABLET | Freq: Every day | ORAL | Status: DC
  Administered 2014-06-29 – 2014-06-30 (×2): 375 ug via ORAL
  Filled 2014-06-28 (×3): qty 1

## 2014-06-28 MED ORDER — HYDROMORPHONE HCL 1 MG/ML IJ SOLN: 0.5000 mg | INTRAMUSCULAR | Status: DC | PRN

## 2014-06-28 MED ORDER — SODIUM CHLORIDE 0.9 % IV SOLN
INTRAVENOUS | Status: DC
  Administered 2014-06-28: 01:00:00 via INTRAVENOUS

## 2014-06-28 MED ORDER — SODIUM CHLORIDE 0.9 % IV SOLN
INTRAVENOUS | Status: DC
  Administered 2014-06-28: 19:00:00 via INTRAVENOUS

## 2014-06-28 MED ORDER — DEXAMETHASONE SODIUM PHOSPHATE 10 MG/ML IJ SOLN
INTRAMUSCULAR | Status: AC
  Filled 2014-06-28: qty 1

## 2014-06-28 MED ORDER — PHENYLEPHRINE HCL 10 MG/ML IJ SOLN
INTRAMUSCULAR | Status: AC
  Filled 2014-06-28: qty 1

## 2014-06-28 MED ORDER — HYDROCODONE-ACETAMINOPHEN 5-325 MG PO TABS
1.0000 | ORAL_TABLET | ORAL | Status: DC | PRN
  Administered 2014-06-29: 2 via ORAL
  Administered 2014-06-29 (×3): 1 via ORAL
  Administered 2014-06-30 (×3): 2 via ORAL
  Filled 2014-06-28 (×3): qty 1
  Filled 2014-06-28 (×4): qty 2

## 2014-06-28 MED ORDER — DOCUSATE SODIUM 100 MG PO CAPS
100.0000 mg | ORAL_CAPSULE | Freq: Two times a day (BID) | ORAL | Status: DC
  Administered 2014-06-29 – 2014-06-30 (×3): 100 mg via ORAL

## 2014-06-28 MED ORDER — MIDAZOLAM HCL 5 MG/5ML IJ SOLN
INTRAMUSCULAR | Status: DC | PRN
  Administered 2014-06-28 (×2): 1 mg via INTRAVENOUS

## 2014-06-28 MED ORDER — LACTATED RINGERS IV SOLN
INTRAVENOUS | Status: DC
  Administered 2014-06-28 (×2): via INTRAVENOUS

## 2014-06-28 MED ORDER — FENTANYL CITRATE (PF) 100 MCG/2ML IJ SOLN
INTRAMUSCULAR | Status: AC
  Filled 2014-06-28: qty 2

## 2014-06-28 MED ORDER — SODIUM CHLORIDE 0.9 % IV SOLN: 250.0000 mL | INTRAVENOUS | Status: DC | PRN

## 2014-06-28 MED ORDER — BISACODYL 10 MG RE SUPP
10.0000 mg | Freq: Every day | RECTAL | Status: DC | PRN
Start: 2014-06-28 — End: 2014-06-30

## 2014-06-28 MED ORDER — ACETAMINOPHEN 325 MG PO TABS: 650.0000 mg | ORAL_TABLET | Freq: Four times a day (QID) | ORAL | Status: DC | PRN

## 2014-06-28 MED ORDER — EPHEDRINE SULFATE 50 MG/ML IJ SOLN
INTRAMUSCULAR | Status: DC | PRN
  Administered 2014-06-28: 5 mg via INTRAVENOUS
  Administered 2014-06-28: 10 mg via INTRAVENOUS

## 2014-06-28 MED ORDER — SUCCINYLCHOLINE CHLORIDE 20 MG/ML IJ SOLN
INTRAMUSCULAR | Status: DC | PRN
  Administered 2014-06-28: 100 mg via INTRAVENOUS

## 2014-06-28 MED ORDER — MIDAZOLAM HCL 2 MG/2ML IJ SOLN
INTRAMUSCULAR | Status: AC
  Filled 2014-06-28: qty 2

## 2014-06-28 MED ORDER — ONDANSETRON HCL 4 MG PO TABS: 4.0000 mg | ORAL_TABLET | Freq: Four times a day (QID) | ORAL | Status: DC | PRN

## 2014-06-28 MED ORDER — METHOCARBAMOL 500 MG PO TABS
500.0000 mg | ORAL_TABLET | Freq: Four times a day (QID) | ORAL | Status: DC | PRN
  Administered 2014-06-30 (×2): 500 mg via ORAL
  Filled 2014-06-28 (×2): qty 1

## 2014-06-28 MED ORDER — ALUM & MAG HYDROXIDE-SIMETH 200-200-20 MG/5ML PO SUSP: 30.0000 mL | Freq: Four times a day (QID) | ORAL | Status: DC | PRN

## 2014-06-28 MED ORDER — PROPOFOL 10 MG/ML IV BOLUS
INTRAVENOUS | Status: AC
  Filled 2014-06-28: qty 20

## 2014-06-28 MED ORDER — CEFAZOLIN SODIUM-DEXTROSE 2-3 GM-% IV SOLR
2.0000 g | Freq: Four times a day (QID) | INTRAVENOUS | Status: AC
  Administered 2014-06-28 – 2014-06-29 (×3): 2 g via INTRAVENOUS
  Filled 2014-06-28 (×3): qty 50

## 2014-06-28 MED ORDER — SODIUM CHLORIDE 0.9 % IJ SOLN: 3.0000 mL | INTRAMUSCULAR | Status: DC | PRN

## 2014-06-28 MED ORDER — PHENOL 1.4 % MT LIQD: 1.0000 | OROMUCOSAL | Status: DC | PRN

## 2014-06-28 MED ORDER — ONDANSETRON HCL 4 MG/2ML IJ SOLN: 4.0000 mg | Freq: Four times a day (QID) | INTRAMUSCULAR | Status: DC | PRN

## 2014-06-28 MED ORDER — CEFAZOLIN SODIUM-DEXTROSE 2-3 GM-% IV SOLR
INTRAVENOUS | Status: DC | PRN
  Administered 2014-06-28: 2 g via INTRAVENOUS

## 2014-06-28 MED ORDER — ONDANSETRON HCL 4 MG/2ML IJ SOLN
INTRAMUSCULAR | Status: DC | PRN
  Administered 2014-06-28: 4 mg via INTRAVENOUS

## 2014-06-28 MED ORDER — OXYCODONE HCL 5 MG PO TABS: 5.0000 mg | ORAL_TABLET | Freq: Once | ORAL | Status: DC | PRN

## 2014-06-28 MED ORDER — CEFAZOLIN SODIUM-DEXTROSE 2-3 GM-% IV SOLR
INTRAVENOUS | Status: AC
  Filled 2014-06-28: qty 50

## 2014-06-28 MED ORDER — PHENYLEPHRINE HCL 10 MG/ML IJ SOLN
INTRAMUSCULAR | Status: DC | PRN
  Administered 2014-06-28 (×2): 80 ug via INTRAVENOUS

## 2014-06-28 MED ORDER — POLYETHYLENE GLYCOL 3350 17 G PO PACK: 17.0000 g | PACK | Freq: Every day | ORAL | Status: DC | PRN

## 2014-06-28 MED ORDER — METOCLOPRAMIDE HCL 10 MG PO TABS: 5.0000 mg | ORAL_TABLET | Freq: Three times a day (TID) | ORAL | Status: DC | PRN

## 2014-06-28 MED ORDER — BUPIVACAINE-EPINEPHRINE (PF) 0.25% -1:200000 IJ SOLN
INTRAMUSCULAR | Status: DC | PRN
  Administered 2014-06-28: 9 mL via PERINEURAL

## 2014-06-28 MED ORDER — KETOROLAC TROMETHAMINE 30 MG/ML IJ SOLN
INTRAMUSCULAR | Status: DC | PRN
  Administered 2014-06-28: 30 mg via INTRAVENOUS

## 2014-06-28 MED ORDER — FENTANYL CITRATE (PF) 100 MCG/2ML IJ SOLN: 25.0000 ug | INTRAMUSCULAR | Status: DC | PRN

## 2014-06-28 MED ORDER — METOCLOPRAMIDE HCL 5 MG/ML IJ SOLN: 5.0000 mg | Freq: Three times a day (TID) | INTRAMUSCULAR | Status: DC | PRN

## 2014-06-28 MED ORDER — KETOROLAC TROMETHAMINE 30 MG/ML IJ SOLN
INTRAMUSCULAR | Status: AC
  Filled 2014-06-28: qty 1

## 2014-06-28 MED ORDER — ESTRADIOL 1 MG PO TABS
0.5000 mg | ORAL_TABLET | Freq: Every day | ORAL | Status: DC
  Administered 2014-06-29 – 2014-06-30 (×2): 0.5 mg via ORAL
  Filled 2014-06-28 (×3): qty 0.5

## 2014-06-28 MED ORDER — FLEET ENEMA 7-19 GM/118ML RE ENEM: 1.0000 | ENEMA | Freq: Once | RECTAL | Status: AC | PRN

## 2014-06-28 MED ORDER — ALPRAZOLAM 0.25 MG PO TABS: 0.2500 mg | ORAL_TABLET | Freq: Every evening | ORAL | Status: DC | PRN

## 2014-06-28 MED ORDER — CITALOPRAM HYDROBROMIDE 20 MG PO TABS
20.0000 mg | ORAL_TABLET | Freq: Every day | ORAL | Status: DC
  Administered 2014-06-29 – 2014-06-30 (×2): 20 mg via ORAL
  Filled 2014-06-28 (×3): qty 1

## 2014-06-28 MED ORDER — BUPIVACAINE-EPINEPHRINE (PF) 0.5% -1:200000 IJ SOLN
INTRAMUSCULAR | Status: DC | PRN
  Administered 2014-06-28: 30 mL via PERINEURAL

## 2014-06-28 MED ORDER — SODIUM CHLORIDE 0.9 % IJ SOLN
3.0000 mL | Freq: Two times a day (BID) | INTRAMUSCULAR | Status: DC
  Administered 2014-06-28 – 2014-06-29 (×2): 3 mL via INTRAVENOUS

## 2014-06-28 MED ORDER — OXYCODONE HCL 5 MG/5ML PO SOLN
5.0000 mg | Freq: Once | ORAL | Status: DC | PRN
  Filled 2014-06-28: qty 5

## 2014-06-28 MED ORDER — LORATADINE 10 MG PO TABS
10.0000 mg | ORAL_TABLET | Freq: Every day | ORAL | Status: DC
  Administered 2014-06-30: 10 mg via ORAL
  Filled 2014-06-28 (×3): qty 1

## 2014-06-28 MED ORDER — FENTANYL CITRATE (PF) 100 MCG/2ML IJ SOLN
INTRAMUSCULAR | Status: DC | PRN
  Administered 2014-06-28 (×2): 50 ug via INTRAVENOUS

## 2014-06-28 SURGICAL SUPPLY — 52 items
BAG SPEC THK2 15X12 ZIP CLS (MISCELLANEOUS)
BAG ZIPLOCK 12X15 (MISCELLANEOUS) ×1 IMPLANT
BOOTIES KNEE HIGH SLOAN (MISCELLANEOUS) ×2 IMPLANT
CAPT SHLDR PARTIAL 2 ×2 IMPLANT
CEMENT BONE DEPUY (Cement) ×2 IMPLANT
CLOSURE WOUND 1/2 X4 (GAUZE/BANDAGES/DRESSINGS) ×1
DECANTER SPIKE VIAL GLASS SM (MISCELLANEOUS) ×1 IMPLANT
DRAPE POUCH INSTRU U-SHP 10X18 (DRAPES) ×3 IMPLANT
DRAPE SURG 17X11 SM STRL (DRAPES) ×3 IMPLANT
DRAPE U-SHAPE 47X51 STRL (DRAPES) ×3 IMPLANT
DRILL BIT 2.0 (BIT) ×2 IMPLANT
DRSG ADAPTIC 3X8 NADH LF (GAUZE/BANDAGES/DRESSINGS) ×2 IMPLANT
DRSG EMULSION OIL 3X3 NADH (GAUZE/BANDAGES/DRESSINGS) ×3 IMPLANT
DRSG PAD ABDOMINAL 8X10 ST (GAUZE/BANDAGES/DRESSINGS) ×3 IMPLANT
DURAPREP 26ML APPLICATOR (WOUND CARE) ×3 IMPLANT
ELECT REM PT RETURN 9FT ADLT (ELECTROSURGICAL) ×3
ELECTRODE REM PT RTRN 9FT ADLT (ELECTROSURGICAL) ×1 IMPLANT
GAUZE SPONGE 4X4 12PLY STRL (GAUZE/BANDAGES/DRESSINGS) ×3 IMPLANT
GLOVE ORTHO TXT STRL SZ7.5 (GLOVE) ×5 IMPLANT
GLOVE SURG ORTHO 8.5 STRL (GLOVE) ×9 IMPLANT
GOWN STRL REUS W/TWL LRG LVL3 (GOWN DISPOSABLE) ×6 IMPLANT
KIT BASIN OR (CUSTOM PROCEDURE TRAY) ×3 IMPLANT
MANIFOLD NEPTUNE II (INSTRUMENTS) ×3 IMPLANT
NDL MA TROC 1/2 CIR (NEEDLE) ×1 IMPLANT
NDL SAFETY ECLIPSE 18X1.5 (NEEDLE) ×1 IMPLANT
NEEDLE HYPO 18GX1.5 SHARP (NEEDLE) ×3
NEEDLE MA TROC 1/2 CIR (NEEDLE) ×3 IMPLANT
NEEDLE MAYO .5 CIRCLE (NEEDLE) ×1 IMPLANT
NS IRRIG 1000ML POUR BTL (IV SOLUTION) ×5 IMPLANT
PACK SHOULDER (CUSTOM PROCEDURE TRAY) ×3 IMPLANT
PASSER SUT SWANSON 36MM LOOP (INSTRUMENTS) ×1 IMPLANT
POSITIONER SURGICAL ARM (MISCELLANEOUS) ×3 IMPLANT
SLING ARM IMMOBILIZER LRG (SOFTGOODS) ×3 IMPLANT
SLING ARM IMMOBILIZER MED (SOFTGOODS) ×2 IMPLANT
SLING ARM MED ADULT FOAM STRAP (SOFTGOODS) ×2 IMPLANT
SPONGE LAP 4X18 X RAY DECT (DISPOSABLE) IMPLANT
SPONGE SURGIFOAM ABS GEL 100 (HEMOSTASIS) ×1 IMPLANT
STAPLER VISISTAT 35W (STAPLE) ×3 IMPLANT
STRIP CLOSURE SKIN 1/2X4 (GAUZE/BANDAGES/DRESSINGS) ×2 IMPLANT
SUT BONE WAX W31G (SUTURE) ×1 IMPLANT
SUT FIBERWIRE #2 38 T-5 BLUE (SUTURE) ×30
SUT MNCRL AB 4-0 PS2 18 (SUTURE) ×2 IMPLANT
SUT VIC AB 0 CT1 27 (SUTURE)
SUT VIC AB 0 CT1 27XBRD ANTBC (SUTURE) ×2 IMPLANT
SUT VIC AB 0 CT1 36 (SUTURE) ×2 IMPLANT
SUT VIC AB 2-0 CT1 27 (SUTURE) ×3
SUT VIC AB 2-0 CT1 27XBRD (SUTURE) ×1 IMPLANT
SUTURE FIBERWR #2 38 T-5 BLUE (SUTURE) IMPLANT
SYR 50ML LL SCALE MARK (SYRINGE) ×3 IMPLANT
TOWEL OR 17X26 10 PK STRL BLUE (TOWEL DISPOSABLE) ×6 IMPLANT
WATER STERILE IRR 1500ML POUR (IV SOLUTION) ×1 IMPLANT
epiphysis body porocoat ×2 IMPLANT

## 2014-06-28 NOTE — Anesthesia Procedure Notes (Addendum)
Anesthesia Regional Block:  Interscalene brachial plexus block  Pre-Anesthetic Checklist: ,, timeout performed, Correct Patient, Correct Site, Correct Laterality, Correct Procedure, Correct Position, site marked, Risks and benefits discussed,  Surgical consent,  Pre-op evaluation,  At surgeon's request and post-op pain management  Laterality: Right  Prep: chloraprep       Needles:  Injection technique: Single-shot  Needle Type: Echogenic Stimulator Needle     Needle Length: 5cm 5 cm Needle Gauge: 22 and 22 G    Additional Needles:  Procedures: ultrasound guided (picture in chart) and nerve stimulator Interscalene brachial plexus block  Nerve Stimulator or Paresthesia:  Response: biceps flexion, 0.45 mA,   Additional Responses:   Narrative:  Start time: 06/28/2014 12:53 PM End time: 06/28/2014 1:03 PM Injection made incrementally with aspirations every 5 mL.  Performed by: Personally  Anesthesiologist: HODIERNE, ADAM  Additional Notes: Functioning IV was confirmed and monitors were applied.  A 67mm 22ga Arrow echogenic stimulator needle was used. Sterile prep and drape,hand hygiene and sterile gloves were used.  Negative aspiration and negative test dose prior to incremental administration of local anesthetic. The patient tolerated the procedure well.  Ultrasound guidance: relevent anatomy identified, needle position confirmed, local anesthetic spread visualized around nerve(s), vascular puncture avoided.  Image printed for medical record.    Procedure Name: Intubation Date/Time: 06/28/2014 1:18 PM Performed by: Lajuana Carry E Pre-anesthesia Checklist: Patient identified, Emergency Drugs available, Suction available and Patient being monitored Patient Re-evaluated:Patient Re-evaluated prior to inductionOxygen Delivery Method: Circle System Utilized Preoxygenation: Pre-oxygenation with 100% oxygen Intubation Type: IV induction Ventilation: Mask ventilation without  difficulty Laryngoscope Size: Miller and 2 Grade View: Grade I Tube type: Oral Tube size: 7.0 mm Number of attempts: 1 Airway Equipment and Method: Stylet Placement Confirmation: ETT inserted through vocal cords under direct vision,  positive ETCO2 and breath sounds checked- equal and bilateral Secured at: 21 cm Tube secured with: Tape Dental Injury: Teeth and Oropharynx as per pre-operative assessment

## 2014-06-28 NOTE — Progress Notes (Signed)
Subjective:   Procedure(s) (LRB): OPEN REDUCTION INTERNAL FIXATION (ORIF) SHOULDER FRACTURE (Right) Patient reports pain as 4 on 0-10 scale. Pain is under control. Good radial Pulse and normal Neurological exam. Right Elbow,no pain.Dr. Veverly Fells will operate today.    Objective: Vital signs in last 24 hours: Temp:  [97.9 F (36.6 C)-98.3 F (36.8 C)] 98.3 F (36.8 C) (05/12 0600) Pulse Rate:  [63-82] 69 (05/12 0600) Resp:  [13-21] 19 (05/12 0600) BP: (113-198)/(48-77) 146/68 mmHg (05/12 0600) SpO2:  [96 %-100 %] 98 % (05/12 0600) Weight:  [79.379 kg (175 lb)] 79.379 kg (175 lb) (05/12 0050)  Intake/Output from previous day: 05/11 0701 - 05/12 0700 In: 385 [I.V.:385] Out: 1500 [Urine:1500] Intake/Output this shift:     Recent Labs  06/28/14 0405  HGB 13.0    Recent Labs  06/28/14 0405  WBC 8.4  RBC 4.40  HCT 41.0  PLT 224    Recent Labs  06/28/14 0405  NA 139  K 4.9  CL 104  CO2 26  BUN 8  CREATININE 0.54  GLUCOSE 133*  CALCIUM 8.0*    Recent Labs  06/28/14 0405  INR 0.98    Neurologically intact Sensation intact distally  Assessment/Plan:   Procedure(s) (LRB): OPEN REDUCTION INTERNAL FIXATION (ORIF) SHOULDER FRACTURE (Right) Up with therapy.Will not do PT .she will have surgery today.  Kaveh Kissinger A 06/28/2014, 7:30 AM

## 2014-06-28 NOTE — Anesthesia Preprocedure Evaluation (Signed)
Anesthesia Evaluation  Patient identified by MRN, date of birth, ID band Patient awake    Reviewed: Allergy & Precautions, NPO status , Patient's Chart, lab work & pertinent test results  Airway Mallampati: II   Neck ROM: full    Dental   Pulmonary former smoker,  breath sounds clear to auscultation        Cardiovascular negative cardio ROS  Rhythm:regular Rate:Normal     Neuro/Psych Anxiety Depression  Neuromuscular disease    GI/Hepatic   Endo/Other  obese  Renal/GU      Musculoskeletal  (+) Arthritis -,   Abdominal   Peds  Hematology   Anesthesia Other Findings   Reproductive/Obstetrics                             Anesthesia Physical Anesthesia Plan  ASA: II  Anesthesia Plan: General and Regional   Post-op Pain Management: MAC Combined w/ Regional for Post-op pain   Induction: Intravenous  Airway Management Planned: Oral ETT  Additional Equipment:   Intra-op Plan:   Post-operative Plan: Extubation in OR  Informed Consent: I have reviewed the patients History and Physical, chart, labs and discussed the procedure including the risks, benefits and alternatives for the proposed anesthesia with the patient or authorized representative who has indicated his/her understanding and acceptance.     Plan Discussed with: CRNA, Anesthesiologist and Surgeon  Anesthesia Plan Comments:         Anesthesia Quick Evaluation

## 2014-06-28 NOTE — Interval H&P Note (Signed)
History and Physical Interval Note:  06/28/2014 12:56 PM  Brianna Mack  has presented today for surgery, with the diagnosis of Right shoulder fracture  The various methods of treatment have been discussed with the patient and family. After consideration of risks, benefits and other options for treatment, the patient has consented to  Procedure(s): OPEN REDUCTION INTERNAL FIXATION (ORIF) SHOULDER FRACTURE (Right) as a surgical intervention .  The patient's history has been reviewed, patient examined, no change in status, stable for surgery.  I have reviewed the patient's chart and labs.  Questions were answered to the patient's satisfaction.     Tamerra Merkley,STEVEN R

## 2014-06-28 NOTE — Op Note (Signed)
NAMEMARICIA, Brianna Mack                    ACCOUNT NO.:  [id number]  MEDICAL RECORD NO.:  [id number]  LOCATION:  4142                         FACILITY:  [facility]  PHYSICIAN:  Kipp Brood. Augustin Bun, M.D.DATE OF BIRTH:  08-26-1945  DATE OF PROCEDURE:  06/27/2014 DATE OF DISCHARGE:                              OPERATIVE REPORT   SURGEON:  Kipp Brood. Dustine Stickler, M.D.  ASSISTANT:  None.  LITTLE HISTORY:  She had a severe dislocation anteriorly of her right shoulder.  She, at this particular time, had what looked like a linear fracture through the humeral neck and also fracture of the glenoid.  I did explain to her we are going to give her some propofol anesthesia. She had this conscious sedation by Dr. Darl Householder, the ER doctor, and I was on board with her and I did explain to her before doing this, we first did a time-out and also marked the appropriate right arm, and I explained that we would try to gently manipulate just to see we can get a head in because the fracture looked like it was a simple nondisplaced fracture, that humeral neck could come off, the head could come off, and I explained that, so I did start to do gentle manipulation and I felt a little cracking sound, so I stopped.  Looked the x-ray and her head definitely started to come off, so it was very unstable, but I felt it was not safe just to leave this in a situation it was without at least making an attempt to reduce this.  Regardless, we did under propofol anesthesia do this gentle manipulation, I stopped midway through, did take a portable x-ray and decided to stop right there, put her in a shoulder immobilizer.  I did call Dr. Esmond Plants, he is going to have to do an open reduction and internal fixation of this because the glenoid is fractured and this was an unstable situation as well.  Post reduction, she has a good radial pulse.  She had good sensation about the deltoid.  She had good finger function, good function in her hand, no  circulatory issues, so she will be admitted to the hospital here at [facility].          ______________________________ Kipp Brood. Gladstone Lighter, M.D.     RAG/MEDQ  D:  06/27/2014  T:  06/28/2014  Job:  [id number]

## 2014-06-28 NOTE — Discharge Instructions (Signed)
No weight bearing with the right arm.  Do exercises every hour while awake.  Make sure that you wear the pillow sling at all times except when bathing or exercising.  Your balance will be off, so be careful and take your time getting up and down.  Keep the incision clean and dry for one week, then ok to get wet in the shower.   Follow up in two weeks with Dr Veverly Fells  [phone]

## 2014-06-28 NOTE — Transfer of Care (Signed)
Immediate Anesthesia Transfer of Care Note  Patient: Brianna Mack  Procedure(s) Performed: Procedure(s) with comments: RIGHT SHOULDER HEMI-ARTHROPLASTY (Right) - WITH GENERAL ANESTHESIA  Patient Location: PACU  Anesthesia Type:General and Regional  Level of Consciousness: awake, alert  and oriented  Airway & Oxygen Therapy: Patient Spontanous Breathing and Patient connected to face mask oxygen  Post-op Assessment: Report given to RN and Post -op Vital signs reviewed and stable  Post vital signs: Reviewed and stable  Last Vitals:  Filed Vitals:   06/28/14 1640  BP: 153/76  Pulse: 103  Temp:   Resp: 19    Complications: No apparent anesthesia complications

## 2014-06-28 NOTE — Progress Notes (Signed)
Notified PA [employer] about cardiac monitoring orders. She stated that patient can remain on floor at this time, notify if pt condition changes. Will continue to monitor.

## 2014-06-28 NOTE — Brief Op Note (Signed)
06/27/2014 - 06/28/2014  4:38 PM  PATIENT:  Brianna Mack  69 y.o. female  PRE-OPERATIVE DIAGNOSIS:  Right shoulder fracture, comminuted and displaced 4 part proximal humerus fracture  POST-OPERATIVE DIAGNOSIS:  Right shoulder fracture, comminuted and displaced 4 part proximal humerus fracture  PROCEDURE:  Procedure(s) with comments: RIGHT SHOULDER HEMI-ARTHROPLASTY (Right) - WITH GENERAL ANESTHESIA  SURGEON:  Surgeon(s) and Role:    * Netta Cedars, MD - Primary  PHYSICIAN ASSISTANT:   ASSISTANTS: Ventura Bruns, [name]   ANESTHESIA:   regional and general  EBL:  Total I/O In: 1600 [I.V.:1600] Out: 450 [Urine:300; Blood:150]  BLOOD ADMINISTERED:none  DRAINS: none   LOCAL MEDICATIONS USED:  MARCAINE     SPECIMEN:  No Specimen  DISPOSITION OF SPECIMEN:  N/A  COUNTS:  YES  TOURNIQUET:  * No tourniquets in log *  DICTATION: .Other Dictation: Dictation Number [id number]  PLAN OF CARE: Admit to inpatient   PATIENT DISPOSITION:  PACU - hemodynamically stable.   Delay start of Pharmacological VTE agent (>24hrs) due to surgical blood loss or risk of bleeding: no

## 2014-06-28 NOTE — Progress Notes (Signed)
Orthopedics Progress Note  Subjective: Patient reports normal right shoulder function pre-fall.  She has a "small tear" in her left rotator cuff and has some pain with that one.  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  Objective:  Filed Vitals:   06/28/14 1147  BP: 113/60  Pulse: 62  Temp: 98 F (36.7 C)  Resp: 18    General: Awake and alert  Musculoskeletal: swollen and tender shoulder, skin intact, NVI distally. Neck non tender in the midline, she does have some right trapezius tenderness. Neurovascularly intact  Lab Results  Component Value Date   WBC 8.4 06/28/2014   HGB 13.0 06/28/2014   HCT 41.0 06/28/2014   MCV 93.2 06/28/2014   PLT 224 06/28/2014       Component Value Date/Time   NA 139 06/28/2014 0405   K 4.9 06/28/2014 0405   CL 104 06/28/2014 0405   CO2 26 06/28/2014 0405   GLUCOSE 133* 06/28/2014 0405   BUN 8 06/28/2014 0405   CREATININE 0.54 06/28/2014 0405   CALCIUM 8.0* 06/28/2014 0405   GFRNONAA >60 06/28/2014 0405   GFRAA >60 06/28/2014 0405    Lab Results  Component Value Date   INR 0.98 06/28/2014    Assessment/Plan: 69 yo female s/p right shoulder fracture/dislocation after fall.  My partner Dr Gladstone Lighter attempted a gentle reduction under propofol but felt a crunching and aborted the reduction attempt.  Post-reduction XRAYs show a displacement of the humeral head from the humeral shaft.  I discussed with the patient that ORIF is now less likely and hemi-arthroplasty may be the only viable option.  With her excellent pre-injury shoulder function, hemi-arthroplasty would be preferred over primary reverse total shoulder replacement.  The patient understands and agrees with the proposed plan.   Doran Heater. Veverly Fells, MD 06/28/2014 12:59 PM

## 2014-06-28 NOTE — H&P (View-Only) (Signed)
Subjective:   Procedure(s) (LRB): OPEN REDUCTION INTERNAL FIXATION (ORIF) SHOULDER FRACTURE (Right) Patient reports pain as 4 on 0-10 scale. Pain is under control. Good radial Pulse and normal Neurological exam. Right Elbow,no pain.Dr. Veverly Fells will operate today.    Objective: Vital signs in last 24 hours: Temp:  [97.9 F (36.6 C)-98.3 F (36.8 C)] 98.3 F (36.8 C) (05/12 0600) Pulse Rate:  [63-82] 69 (05/12 0600) Resp:  [13-21] 19 (05/12 0600) BP: (113-198)/(48-77) 146/68 mmHg (05/12 0600) SpO2:  [96 %-100 %] 98 % (05/12 0600) Weight:  [79.379 kg (175 lb)] 79.379 kg (175 lb) (05/12 0050)  Intake/Output from previous day: 05/11 0701 - 05/12 0700 In: 385 [I.V.:385] Out: 1500 [Urine:1500] Intake/Output this shift:     Recent Labs  06/28/14 0405  HGB 13.0    Recent Labs  06/28/14 0405  WBC 8.4  RBC 4.40  HCT 41.0  PLT 224    Recent Labs  06/28/14 0405  NA 139  K 4.9  CL 104  CO2 26  BUN 8  CREATININE 0.54  GLUCOSE 133*  CALCIUM 8.0*    Recent Labs  06/28/14 0405  INR 0.98    Neurologically intact Sensation intact distally  Assessment/Plan:   Procedure(s) (LRB): OPEN REDUCTION INTERNAL FIXATION (ORIF) SHOULDER FRACTURE (Right) Up with therapy.Will not do PT .she will have surgery today.  Maymuna Detzel A 06/28/2014, 7:30 AM

## 2014-06-28 NOTE — Progress Notes (Signed)
AssistedDr. Marcie Bal with right, interscalene  block. Side rails up, monitors on throughout procedure. See vital signs in flow sheet. Tolerated Procedure well.

## 2014-06-28 NOTE — Anesthesia Postprocedure Evaluation (Signed)
Anesthesia Post Note  Patient: Brianna Mack  Procedure(s) Performed: Procedure(s) (LRB): RIGHT SHOULDER HEMI-ARTHROPLASTY (Right)  Anesthesia type: General  Patient location: PACU  Post pain: Pain level controlled and Adequate analgesia  Post assessment: Post-op Vital signs reviewed, Patient's Cardiovascular Status Stable, Respiratory Function Stable, Patent Airway and Pain level controlled  Last Vitals:  Filed Vitals:   06/28/14 1700  BP: 157/65  Pulse: 100  Temp:   Resp: 15    Post vital signs: Reviewed and stable  Level of consciousness: awake, alert  and oriented  Complications: No apparent anesthesia complications

## 2014-06-28 NOTE — Plan of Care (Signed)
Problem: Phase I Progression Outcomes Goal: Voiding-avoid urinary catheter unless indicated Outcome: Not Met (add Reason) Foley placed per MD order.

## 2014-06-29 ENCOUNTER — Encounter (HOSPITAL_COMMUNITY): Payer: Self-pay | Admitting: Orthopedic Surgery

## 2014-06-29 ENCOUNTER — Encounter: Payer: Self-pay | Admitting: Internal Medicine

## 2014-06-29 NOTE — Evaluation (Signed)
Occupational Therapy Evaluation Patient Details Name: Brianna Mack MRN: [id number] DOB: March 10, 1945 Today's Date: 06/29/2014    History of Present Illness pt fell and sustained a 4 part humeral head comminuted fx and is s/p hemiarthroplasty with conservative tx protocol ordered   Clinical Impression   This 69 year old female was admitted for the above.  She will benefit from skilled OT in acute to reinforce education for ADLs without moving shoulder and being able to direct a caregiver in assisting her to safely complete these tasks as well as donning/doffing sling following precautions.    Follow Up Recommendations   (pt will follow up with Dr Veverly Fells.  HHOT is not a stand alone)    Equipment Recommendations  None recommended by OT    Recommendations for Other Services       Precautions / Restrictions Precautions Precautions: Shoulder Type of Shoulder Precautions: sling at all times except bathing/dressing; AROM elbow to fingers Required Braces or Orthoses: Sling (R shoulder abductor) Restrictions RUE Weight Bearing: Non weight bearing      Mobility Bed Mobility Overal bed mobility: Needs Assistance Bed Mobility: Supine to Sit     Supine to sit: Supervision     General bed mobility comments: HOB raised.  Pt's bed does this at home and she has a Conservation officer, nature  Transfers Overall transfer level: Needs assistance Equipment used: 1 person hand held assist Transfers: Sit to/from Stand Sit to Stand: Min assist         General transfer comment: steadying assistance    Balance                                            ADL Overall ADL's : Needs assistance/impaired     Grooming: Set up;Sitting   Upper Body Bathing: Maximal assistance;Sitting   Lower Body Bathing: Sit to/from stand;Moderate assistance   Upper Body Dressing : Maximal assistance;Sitting   Lower Body Dressing: Maximal assistance;Sit to/from stand   Toilet Transfer:  Minimal assistance;Ambulation;BSC             General ADL Comments: performed UB bathing and educated on UB dressing.  Reviewed shoulder protocol.  Pt will have caregiver assist her (likely every other day).  Pt's sling did not have velcro to secure sling to pillow.  Called ortho tech and he retrieved this from sx.  Reviewed sling application and removal twice.  Pt initially unsteady when walking to bathroom--min A.  C/o stiffness.  It was her first time up.  Showed sling fitting in mirror.       Vision     Perception     Praxis      Pertinent Vitals/Pain Pain Assessment: No/denies pain (block still in effect)     Hand Dominance Right   Extremity/Trunk Assessment Upper Extremity Assessment Upper Extremity Assessment: RUE deficits/detail RUE Deficits / Details: immobilized           Communication Communication Communication: No difficulties   Cognition Arousal/Alertness: Awake/alert Behavior During Therapy: WFL for tasks assessed/performed Overall Cognitive Status: Within Functional Limits for tasks assessed                     General Comments       Exercises       Shoulder Instructions      Home Living Family/patient expects to be discharged to:: Private residence Living  Arrangements: Spouse/significant other;Other relatives Available Help at Discharge: Personal care attendant               Bathroom Shower/Tub: Walk-in shower (accessible)   Bathroom Toilet: Handicapped height     Home Equipment: Grab bars - toilet;Grab bars - tub/shower;Shower seat (roll in shower seat)   Additional Comments: husband has had CVA and pt is his caregiver. At this time, she has 24/7 private aides staying with him      Prior Functioning/Environment Level of Independence: Independent             OT Diagnosis: Generalized weakness   OT Problem List: Decreased strength;Impaired balance (sitting and/or standing);Pain;Impaired UE functional use   OT  Treatment/Interventions: Self-care/ADL training;DME and/or AE instruction;Patient/family education    OT Goals(Current goals can be found in the care plan section) Acute Rehab OT Goals Patient Stated Goal: get back to taking care of husband OT Goal Formulation: With patient Time For Goal Achievement: 07/06/14 Potential to Achieve Goals: Good ADL Goals Pt Will Transfer to Toilet: with modified independence;ambulating;bedside commode Additional ADL Goal #1: pt will independently direct caregiver in assisting with UB adls and sling donning/doffing  OT Frequency: Min 2X/week   Barriers to D/C: Decreased caregiver support  she is caregiver for husband; has hired caregivers; 24/7 since her accident       Co-evaluation              End of Session Nurse Communication: Mobility status  Activity Tolerance: Patient tolerated treatment well Patient left: in chair;with call bell/phone within reach   Time: 4259-5638 OT Time Calculation (min): 67 min Charges:  OT General Charges $OT Visit: 1 Procedure OT Evaluation $Initial OT Evaluation Tier I: 1 Procedure OT Treatments $Self Care/Home Management : 38-52 mins G-Codes:    Jayvan Mcshan 2014-07-16, 12:34 PM  Lesle Chris, OTR/L [phone] 07/16/2014

## 2014-06-29 NOTE — Progress Notes (Signed)
   Subjective: 1 Day Post-Op Procedure(s) (LRB): RIGHT SHOULDER HEMI-ARTHROPLASTY (Right)  Pt sitting in bed comfortably, block still working Will d/c foley today and work with therapy D/c planning and logistics since she takes care of her husband at home Patient reports pain as none.  Objective:   VITALS:   Filed Vitals:   06/29/14 0543  BP: 133/57  Pulse: 71  Temp: 97.8 F (36.6 C)  Resp: 16    Right shoulder dressing intact nv intact distally Sling in place  LABS  Recent Labs  06/28/14 0405  HGB 13.0  HCT 41.0  WBC 8.4  PLT 224     Recent Labs  06/28/14 0405  NA 139  K 4.9  BUN 8  CREATININE 0.54  GLUCOSE 133*     Assessment/Plan: 1 Day Post-Op Procedure(s) (LRB): RIGHT SHOULDER HEMI-ARTHROPLASTY (Right) D/c planning PT/OT and safety assessment D/c foley Pain control as needed    Merla Riches, MPAS, [name]  06/29/2014, 7:08 AM

## 2014-06-29 NOTE — Care Management Note (Signed)
Case Management Note  Patient Details  Name: Brianna Mack MRN: [id number] Date of Birth: 28-Apr-1945  Subjective/Objective:             Admitted s/p fall requiring ORIF of shoulder        Action/Plan: Discharge planning, spoke with patient at bedside. 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 She currently has a long term care policy which allows her to hire full time caregivers for him. She also has a policy but it is new and does not have much benefit in it yet, she does not want to use the policy that her husband is using as she feels he will likely exhaust those benefits. She anticipates OP therapy. Discussed resources with her policy and contacting her insurance provider who may have CM's to work with her on home needs. Encouraged her to enlist family, friends, church members to assist in the interim. OT and aides are not stand alone services. Patient appreciative of assistance and information.  Expected Discharge Date:                  Expected Discharge Plan:  [place]  In-House Referral:     Discharge planning Services  CM Consult  Post Acute Care Choice:    Choice offered to:     DME Arranged:    DME Agency:     HH Arranged:    [place] Agency:     Status of Service:  Completed, signed off  Medicare Important Message Given:    Date Medicare IM Given:    Medicare IM give by:    Date Additional Medicare IM Given:    Additional Medicare Important Message give by:     If discussed at [place] of Stay Meetings, dates discussed:    Additional Comments: Discussed with staff Guadalupe Maple, RN 06/29/2014, 1:14 PM

## 2014-06-29 NOTE — Op Note (Signed)
NAMEQUANASIA, DEFINO NO.:  [id number]  MEDICAL RECORD NO.:  [id number]  LOCATION:  7096                         FACILITY:  [facility]  PHYSICIAN:  Doran Heater. Veverly Fells, M.D. DATE OF BIRTH:  30-Sep-1945  DATE OF PROCEDURE:  06/27/2014 DATE OF DISCHARGE:                              OPERATIVE REPORT   PREOPERATIVE DIAGNOSIS:  Right shoulder comminuted displaced 4-part proximal humerus fracture following shoulder dislocation.  POSTOPERATIVE DIAGNOSIS:  Right shoulder comminuted displaced 4-part proximal humerus fracture following shoulder dislocation.  PROCEDURE PERFORMED:  Right shoulder hemiarthroplasty using Haskell with size 10 stem and 4418 head.  ATTENDING SURGEON:  Doran Heater. Veverly Fells, M.D.  ASSISTANT:  Abbott Pao. Dixon, [name], who scrubbed the entire procedure and necessary for satisfactory completion of surgery.  ANESTHESIA:  General anesthesia was used plus interscalene block.  ESTIMATED BLOOD LOSS:  Less than 100 mL.  FLUID REPLACEMENT:  1200 mL crystalloid.  INSTRUMENT COUNTS:  Correct.  COMPLICATIONS:  There were no complications.  ANTIBIOTICS:  Perioperative antibiotics were given.  INDICATIONS:  The patient is a 69 year old female who suffered a ground- level fall after tripping over pavement while walking her dog.  She complained of immediate shoulder pain and inability to use her arm.  She presented to the emergency room with a shoulder dislocation with comminuted 4-part proximal humerus fracture.  The patient had an initial attempt at a closed reduction by Dr. Gladstone Lighter, my partner. Unfortunately, the humeral shaft did come back lateral, but the humeral head remained in displaced medial dislocated position.  At this point, the closed reduction under propofol sedation was aborted.  The patient was admitted to the hospital for operative treatment today.  I discussed with the patient options for management, I did recommend for her  likely hemiarthroplasty as I thought it unlikely we would be able to get the ball back into a stable position on the shaft and maintain that position.  The patient agreed and signed the informed consent.  DESCRIPTION OF PROCEDURE:  After an adequate level of anesthesia was achieved, the patient was positioned in the modified beach-chair position.  Right shoulder correctly identified and sterilely prepped and draped in the usual manner.  Time-out called.  We entered the shoulder using deltopectoral approach.  Dissection down through subcutaneous tissues using the needle-tip Bovie.  I identified the cephalic vein, took it laterally with the deltoid and pectoralis taken medially. Conjoined tendon identified, fracture hematoma identified and evacuated. The humeral shaft was immediately identifiable.  Unfortunately, the humeral head was displaced and was actually under the brachial plexus. Very gently I was able to free that out from under the brachial plexus and after some time was able to lever that out of the wound.  It was completely free and unstable.  The lesser tuberosity was identified in the subscapularis and then we went and placed #2 FiberWire suture in a modified W stitch on the medial side of the lesser tuberosity into the tendon for good purchase and repair.  We then went ahead and identified the greater tuberosity, debulked that, placed #2 FiberWire on the far side of that laterally with a modified W  stitch giving good purchase at the tendon-bone interface.  We removed excess bone fragments.  The humeral head was actually in 4 pieces, 1 large piece and then 3 others that had a good amount of articular cartilage on it.  That bone was used for bone grafting.  We did morselize bone graft into small pieces for the end of surgery.  We then took a trial, we reamed up to a size 10 shaft diameter, and then placed a 10 stem, 10 body, and a 4418 humeral head component, and placed that  trial component into the humeral shaft, reduced the shoulder, reduced the tuberosities, and checked our height and version, and made notations about that.  We removed the trial implants, thoroughly irrigated, and dried the bone, and then cemented the real size 10 Global Unite Stem with a 10 Global Unite +0 metaphyseal attachment and cemented that in position.  We also selected the real 4418 head and impacted that into position once the cement was allowed to harden.  We reduced the shoulder and felt that the height of the implant on the humeral side was too high, it just seemed like it was overstuffing the joint and actually was somewhat unstable to superior anterior position.  The version was excellent and was about 20 degrees of retroversion.  We went ahead and dislocated the shoulder again, we then took off the +0 metaphyseal component and then placed a porous- coated -5 metaphyseal component and that got our height therefore I used a 4418 head that we already had and impacted that, reduced the shoulder. We had the around the world stitch through the medial thin hole and then reduced the shoulder.  We were again pleased with the location, the height, the version, and then we reduced our tuberosities and placed bone graft against the porous-coat.  We placed our sutures medial to the lesser tuberosity, lateral to the greater tuberosity for the around the world stitch.  We then compressed the greater tuberosity and lesser tuberosity into that bone graft, tied the tuberosities to each other with a #2 FiberWire.  We then took the sutures, we placed the drill holes in the humerus, centered on the bicipital groove.  We went and took those shaft sutures and did mattress sutures up to the subscapularis in the rotator cuff to attach tuberosity to shaft and then tied our around the world stitch and we were pleased with our tuberosity fixation.  We then ranged her shoulder, and was able to take my  finger and sweep around posteriorly, the shoulder was stable and the motion was good, there was just a little bit of motion as I ranged her shoulder within the tuberosity construct, so we are going to go pretty slow on her and try to let the tuberosities heal in.  It was not a lot of motion, it was very small micro motion, but still concerning that we would not be able to too fast.  We irrigated the wound, closed the deltopectoral interval with 0-Vicryl suture followed by 2-0 Vicryl subcutaneous closure, and 4-0 Monocryl for skin.  Sterile dressings were applied followed by shoulder sling immobilizer pillow.  The patient tolerated the surgery well.     Doran Heater. Veverly Fells, M.D.     SRN/MEDQ  D:  06/28/2014  T:  06/29/2014  Job:  [id number]

## 2014-06-30 MED ORDER — METHOCARBAMOL 500 MG PO TABS: 500.0000 mg | ORAL_TABLET | Freq: Four times a day (QID) | ORAL | Status: DC | PRN

## 2014-06-30 MED ORDER — OXYCODONE HCL 5 MG PO TABS: 5.0000 mg | ORAL_TABLET | ORAL | Status: DC | PRN

## 2014-06-30 NOTE — Progress Notes (Signed)
Discharged from floor via w/c, family & belongings with pt. No changes in assessment. Cliffton Spradley  

## 2014-06-30 NOTE — Progress Notes (Signed)
NCM spoke to pt and offered choice for [facility]. Pt requested [facility] for HH. Pt states she has RW, wheelchair and cane at home. Husband at home to assist with her care. Jonnie Finner RN CCM Case Mgmt phone [phone]

## 2014-06-30 NOTE — Progress Notes (Signed)
   Subjective: 2 Days Post-Op Procedure(s) (LRB): RIGHT SHOULDER HEMI-ARTHROPLASTY (Right)  Pt sitting up in chair fairly comfortably Would like to go home today to see husband Round the clock aide currently at the house with husband Pain is mild Sling in place Patient reports pain as mild.  Objective:   VITALS:   Filed Vitals:   06/30/14 0531  BP: 117/55  Pulse: 82  Temp: 98.8 F (37.1 C)  Resp: 16    Right shoulder dressing and sling intact nv intact distally No rashes or edema  LABS  Recent Labs  06/28/14 0405  HGB 13.0  HCT 41.0  WBC 8.4  PLT 224     Recent Labs  06/28/14 0405  NA 139  K 4.9  BUN 8  CREATININE 0.54  GLUCOSE 133*     Assessment/Plan: 2 Days Post-Op Procedure(s) (LRB): RIGHT SHOULDER HEMI-ARTHROPLASTY (Right) Plan to d/c home as long as aid setup to take care of both patient and her husband Home health PT/OT for safety Pain management F/u in 1-2 weeks in the office    Brad Yoseph Haile, MPAS, [name]  06/30/2014, 7:50 AM

## 2014-06-30 NOTE — Progress Notes (Signed)
Occupational Therapy Treatment Patient Details Name: Bird Tailor MRN: [id number] DOB: [birth year] Today's Date: 06/30/2014    History of present illness pt fell and sustained a 4 part humeral head comminuted fx and is s/p hemiarthroplasty with conservative tx protocol ordered   OT comments  Pt needs HHOT and PT for fall prevention and ADL's s/p fall  Follow Up Recommendations  Home health OT    Equipment Recommendations  None recommended by OT    Recommendations for Other Services      Precautions / Restrictions Precautions Precautions: Shoulder Type of Shoulder Precautions: sling at all times except bathing/dressing; AROM elbow to fingers Shoulder Interventions: Shoulder abduction pillow Restrictions RUE Weight Bearing: Non weight bearing       Mobility Bed Mobility Overal bed mobility: Needs Assistance Bed Mobility: Supine to Sit     Supine to sit: Supervision     General bed mobility comments: HOB raised.  Pt's bed does this at home and she has a Conservation officer, nature  Transfers Overall transfer level: Needs assistance Equipment used: 1 person hand held assist   Sit to Stand: Min assist         General transfer comment: steadying assistance    Balance                                   ADL                   Upper Body Dressing : Moderate assistance;Sitting       Toilet Transfer: Ambulation;BSC;Min guard             General ADL Comments: Spoke with PA.  Feel pt needs HHOT and PT for ADL's and fall prevention.  Feel pt will need reinforcement with sling at home and she is not I with donning and doffing      Vision                     Perception     Praxis      Cognition   Behavior During Therapy: WFL for tasks assessed/performed Overall Cognitive Status: Within Functional Limits for tasks assessed                       Extremity/Trunk Assessment               Exercises     Shoulder  Instructions       General Comments      Pertinent Vitals/ Pain       Pain Assessment: 0-10 Pain Score: 3  Pain Descriptors / Indicators: Sore Pain Intervention(s): Limited activity within patient's tolerance;Monitored during session;Repositioned;Ice applied  Home Living                                          Prior Functioning/Environment              Frequency       Progress Toward Goals  OT Goals(current goals can now be found in the care plan section)  Progress towards OT goals: Progressing toward goals     Plan Discharge plan needs to be updated    Co-evaluation                 End of Session  Activity Tolerance Patient tolerated treatment well   Patient Left in chair   Nurse Communication Mobility status        Time: 0925-1000 OT Time Calculation (min): 35 min  Charges: OT General Charges $OT Visit: 1 Procedure OT Treatments $Self Care/Home Management : 23-37 mins  Naseer Hearn, Thereasa Parkin 06/30/2014, 10:22 AM

## 2014-06-30 NOTE — Care Management Note (Signed)
Case Management Note  Patient Details  Name: Brianna Mack MRN: [id number] Date of Birth: 11-04-1945  Subjective/Objective:         RIGHT SHOULDER HEMI-ARTHROPLASTY           Action/Plan: Home with husband  Expected Discharge Date:                  Expected Discharge Plan:  [place]  In-House Referral:     Discharge planning Services  CM Consult  Post Acute Care Choice:  Home Health Choice offered to:  Patient  DME Arranged:    DME Agency:     HH Arranged:  PT, OT HH Agency:  [facility]  Status of Service:  Completed, signed off  Medicare Important Message Given:  Yes Date Medicare IM Given:  06/30/14 Medicare IM give by:  Jonnie Finner RN CCM  Date Additional Medicare IM Given:    Additional Medicare Important Message give by:     If discussed at [place] of Stay Meetings, dates discussed:    Additional Comments: NCM spoke to pt and offered choice for [facility]. Pt requested [facility] for [facility] PT/OT. Notified [facility] for [place]. She has RW and wheelchair at home.  Erenest Rasher, RN 06/30/2014, 11:09 AM

## 2014-06-30 NOTE — Discharge Summary (Signed)
Physician Discharge Summary   Patient ID: Brianna Mack MRN: [id number] DOB/AGE: 18-Sep-1945 69 y.o.  Admit date: 06/27/2014 Discharge date: 06/30/2014  Admission Diagnoses:  Active Problems:   Fracture dislocation of shoulder joint   Shoulder fracture   Discharge Diagnoses:  Same   Surgeries: Procedure(s): RIGHT SHOULDER HEMI-ARTHROPLASTY on 06/27/2014 - 06/28/2014   Consultants: PT/OT  Discharged Condition: Stable  Hospital Course: Brianna Mack is an 69 y.o. female who was admitted 06/27/2014 with a chief complaint of  Chief Complaint  Patient presents with  . Fall  . Shoulder Injury  , and found to have a diagnosis of <principal problem not specified>.  They were brought to the operating room on 06/27/2014 - 06/28/2014 and underwent the above named procedures.    The patient had an uncomplicated hospital course and was stable for discharge.  Recent vital signs:  Filed Vitals:   06/30/14 0531  BP: 117/55  Pulse: 82  Temp: 98.8 F (37.1 C)  Resp: 16    Recent laboratory studies:  Results for orders placed or performed during the hospital encounter of 06/27/14  Surgical pcr screen  Result Value Ref Range   MRSA, PCR NEGATIVE NEGATIVE   Staphylococcus aureus NEGATIVE NEGATIVE  Protime-INR  Result Value Ref Range   Prothrombin Time 13.1 11.6 - 15.2 seconds   INR 0.98 0.00 - 1.49  Comprehensive metabolic panel  Result Value Ref Range   Sodium 139 135 - 145 mmol/L   Potassium 4.9 3.5 - 5.1 mmol/L   Chloride 104 101 - 111 mmol/L   CO2 26 22 - 32 mmol/L   Glucose, Bld 133 (H) 65 - 99 mg/dL   BUN 8 6 - 20 mg/dL   Creatinine, Ser 0.54 0.44 - 1.00 mg/dL   Calcium 8.0 (L) 8.9 - 10.3 mg/dL   Total Protein 6.4 (L) 6.5 - 8.1 g/dL   Albumin 3.6 3.5 - 5.0 g/dL   AST 199 (H) 15 - 41 U/L   ALT 91 (H) 14 - 54 U/L   Alkaline Phosphatase 68 38 - 126 U/L   Total Bilirubin 0.6 0.3 - 1.2 mg/dL   GFR calc non Af Amer >60 >60 mL/min   GFR calc Af Amer >60 >60 mL/min   Anion  gap 9 5 - 15  CBC with Differential/Platelet  Result Value Ref Range   WBC 8.4 4.0 - 10.5 K/uL   RBC 4.40 3.87 - 5.11 MIL/uL   Hemoglobin 13.0 12.0 - 15.0 g/dL   HCT 41.0 36.0 - 46.0 %   MCV 93.2 78.0 - 100.0 fL   MCH 29.5 26.0 - 34.0 pg   MCHC 31.7 30.0 - 36.0 g/dL   RDW 13.9 11.5 - 15.5 %   Platelets 224 150 - 400 K/uL   Neutrophils Relative % 77 43 - 77 %   Neutro Abs 6.5 1.7 - 7.7 K/uL   Lymphocytes Relative 15 12 - 46 %   Lymphs Abs 1.3 0.7 - 4.0 K/uL   Monocytes Relative 8 3 - 12 %   Monocytes Absolute 0.6 0.1 - 1.0 K/uL   Eosinophils Relative 0 0 - 5 %   Eosinophils Absolute 0.0 0.0 - 0.7 K/uL   Basophils Relative 0 0 - 1 %   Basophils Absolute 0.0 0.0 - 0.1 K/uL    Discharge Medications:     Medication List    ASK your doctor about these medications        ACIDOPHILUS PO  Take by mouth daily.  ALPRAZolam 0.25 MG tablet  Commonly known as:  XANAX  Take 1 tablet (0.25 mg total) by mouth at bedtime as needed for anxiety.     azithromycin 250 MG tablet  Commonly known as:  ZITHROMAX  As directed     CALCIUM + D PO  Take 1 tablet by mouth daily.     citalopram 20 MG tablet  Commonly known as:  CELEXA  Take 1 tablet (20 mg total) by mouth daily.     estradiol 0.5 MG tablet  Commonly known as:  ESTRACE  Take 1 tablet (0.5 mg total) by mouth daily.     guaiFENesin 600 MG 12 hr tablet  Commonly known as:  MUCINEX  Take 600 mg by mouth 2 (two) times daily as needed for cough.     hyoscyamine 0.375 MG 12 hr tablet  Commonly known as:  LEVBID  Take 0.375 mg by mouth daily.     TYLENOL ARTHRITIS PAIN PO  Take 1,000 mg by mouth 2 (two) times daily as needed (back pain).        Diagnostic Studies: Dg Shoulder Right  06/27/2014   CLINICAL DATA:  Tripped and fell wall walking dog. Right shoulder injury and pain. Initial encounter.  EXAM: RIGHT SHOULDER - 2+ VIEW  COMPARISON:  None.  FINDINGS: Anterior dislocation of the humeral head is seen. There is also  a fracture deformity involving the humeral neck, and another displaced fracture fragment along the inferior aspect of the glenoid which likely originates from the right humeral head.  IMPRESSION: Anterior dislocation of the right shoulder, with right humeral neck fracture.   Electronically Signed   By: Earle Gell M.D.   On: 06/27/2014 20:14   Dg Chest Port 1 View  06/27/2014   CLINICAL DATA:  Preop for right shoulder surgery  EXAM: PORTABLE CHEST - 1 VIEW  COMPARISON:  None.  FINDINGS: A single AP portable view of the chest demonstrates no focal airspace consolidation or alveolar edema. The lungs are grossly clear. There is no large effusion or pneumothorax. Cardiac and mediastinal contours appear unremarkable.  IMPRESSION: No active disease.   Electronically Signed   By: Andreas Newport M.D.   On: 06/27/2014 23:24   Dg Shoulder Right Port  06/28/2014   CLINICAL DATA:  Status post right shoulder replacement  EXAM: PORTABLE RIGHT SHOULDER - 2+ VIEW  COMPARISON:  06/27/2014  FINDINGS: Right shoulder prosthesis is now seen in satisfactory position. Minimal right basilar atelectasis is noted. The bony thorax is within normal limits as visualized.  IMPRESSION: Status post right shoulder replacement   Electronically Signed   By: Inez Catalina M.D.   On: 06/28/2014 17:38   Dg Shoulder Right Port  06/27/2014   CLINICAL DATA:  Fall. Shoulder injury. Fracture dislocation on previous study. Postreduction.  EXAM: PORTABLE RIGHT SHOULDER - 2+ VIEW  COMPARISON:  06/27/2014  FINDINGS: Persistent anterior dislocation of the right shoulder with fracture of the right humeral neck. Since the previous study, there is increased lateral displacement of the distal fracture fragment with respect to the humeral head fragment. Displaced fragment off of the inferior glenoid again demonstrated. Acromioclavicular and coracoclavicular spaces are maintained.  IMPRESSION: Fracture dislocation of the right shoulder as described.  Persistent anterior dislocation. Increased displacement of fracture fragments since previous study.   Electronically Signed   By: Lucienne Capers M.D.   On: 06/27/2014 22:06   Dg Humerus Right  06/27/2014   CLINICAL DATA:  Acute right shoulder pain after  fall this evening. Initial encounter.  EXAM: RIGHT HUMERUS - 2+ VIEW  COMPARISON:  None.  FINDINGS: Anterior dislocation of proximal right humeral head is noted, with with severely displaced fracture involving a portion of the humeral head. This appears to be closed and posttraumatic.  IMPRESSION: Anterior dislocation of proximal right humeral head, with severely displaced fracture involving a portion of the proximal right humeral head.   Electronically Signed   By: Marijo Conception, M.D.   On: 06/27/2014 20:14    Disposition:         Follow-up Information    Follow up with NORRIS,STEVEN R, MD. Call in 2 weeks.   Specialty:  Orthopedic Surgery   Why:  [phone]   Contact information:   [address] [place] [address] [phone]        Signed: Ventura Bruns 06/30/2014, 7:52 AM

## 2014-07-02 DIAGNOSIS — F419 Anxiety disorder, unspecified: Secondary | ICD-10-CM | POA: Diagnosis not present

## 2014-07-02 DIAGNOSIS — K589 Irritable bowel syndrome without diarrhea: Secondary | ICD-10-CM | POA: Diagnosis not present

## 2014-07-02 DIAGNOSIS — F329 Major depressive disorder, single episode, unspecified: Secondary | ICD-10-CM | POA: Diagnosis not present

## 2014-07-02 DIAGNOSIS — M1990 Unspecified osteoarthritis, unspecified site: Secondary | ICD-10-CM | POA: Diagnosis not present

## 2014-07-02 DIAGNOSIS — S42201D Unspecified fracture of upper end of right humerus, subsequent encounter for fracture with routine healing: Secondary | ICD-10-CM | POA: Diagnosis not present

## 2014-07-03 DIAGNOSIS — K589 Irritable bowel syndrome without diarrhea: Secondary | ICD-10-CM | POA: Diagnosis not present

## 2014-07-03 DIAGNOSIS — M1990 Unspecified osteoarthritis, unspecified site: Secondary | ICD-10-CM | POA: Diagnosis not present

## 2014-07-03 DIAGNOSIS — F329 Major depressive disorder, single episode, unspecified: Secondary | ICD-10-CM | POA: Diagnosis not present

## 2014-07-03 DIAGNOSIS — S42201D Unspecified fracture of upper end of right humerus, subsequent encounter for fracture with routine healing: Secondary | ICD-10-CM | POA: Diagnosis not present

## 2014-07-03 DIAGNOSIS — F419 Anxiety disorder, unspecified: Secondary | ICD-10-CM | POA: Diagnosis not present

## 2014-07-06 DIAGNOSIS — K589 Irritable bowel syndrome without diarrhea: Secondary | ICD-10-CM | POA: Diagnosis not present

## 2014-07-06 DIAGNOSIS — F329 Major depressive disorder, single episode, unspecified: Secondary | ICD-10-CM | POA: Diagnosis not present

## 2014-07-06 DIAGNOSIS — S42201D Unspecified fracture of upper end of right humerus, subsequent encounter for fracture with routine healing: Secondary | ICD-10-CM | POA: Diagnosis not present

## 2014-07-06 DIAGNOSIS — M1990 Unspecified osteoarthritis, unspecified site: Secondary | ICD-10-CM | POA: Diagnosis not present

## 2014-07-06 DIAGNOSIS — F419 Anxiety disorder, unspecified: Secondary | ICD-10-CM | POA: Diagnosis not present

## 2014-07-12 DIAGNOSIS — Z96611 Presence of right artificial shoulder joint: Secondary | ICD-10-CM | POA: Diagnosis not present

## 2014-07-12 DIAGNOSIS — Z471 Aftercare following joint replacement surgery: Secondary | ICD-10-CM | POA: Diagnosis not present

## 2014-07-13 DIAGNOSIS — M1990 Unspecified osteoarthritis, unspecified site: Secondary | ICD-10-CM | POA: Diagnosis not present

## 2014-07-13 DIAGNOSIS — S42201D Unspecified fracture of upper end of right humerus, subsequent encounter for fracture with routine healing: Secondary | ICD-10-CM | POA: Diagnosis not present

## 2014-07-13 DIAGNOSIS — K589 Irritable bowel syndrome without diarrhea: Secondary | ICD-10-CM | POA: Diagnosis not present

## 2014-07-13 DIAGNOSIS — F419 Anxiety disorder, unspecified: Secondary | ICD-10-CM | POA: Diagnosis not present

## 2014-07-13 DIAGNOSIS — F329 Major depressive disorder, single episode, unspecified: Secondary | ICD-10-CM | POA: Diagnosis not present

## 2014-07-16 DIAGNOSIS — F329 Major depressive disorder, single episode, unspecified: Secondary | ICD-10-CM | POA: Diagnosis not present

## 2014-07-16 DIAGNOSIS — F419 Anxiety disorder, unspecified: Secondary | ICD-10-CM | POA: Diagnosis not present

## 2014-07-16 DIAGNOSIS — K589 Irritable bowel syndrome without diarrhea: Secondary | ICD-10-CM | POA: Diagnosis not present

## 2014-07-16 DIAGNOSIS — S42201D Unspecified fracture of upper end of right humerus, subsequent encounter for fracture with routine healing: Secondary | ICD-10-CM | POA: Diagnosis not present

## 2014-07-16 DIAGNOSIS — M1990 Unspecified osteoarthritis, unspecified site: Secondary | ICD-10-CM | POA: Diagnosis not present

## 2014-07-19 DIAGNOSIS — K589 Irritable bowel syndrome without diarrhea: Secondary | ICD-10-CM | POA: Diagnosis not present

## 2014-07-19 DIAGNOSIS — M1990 Unspecified osteoarthritis, unspecified site: Secondary | ICD-10-CM | POA: Diagnosis not present

## 2014-07-19 DIAGNOSIS — S42201D Unspecified fracture of upper end of right humerus, subsequent encounter for fracture with routine healing: Secondary | ICD-10-CM | POA: Diagnosis not present

## 2014-07-19 DIAGNOSIS — F329 Major depressive disorder, single episode, unspecified: Secondary | ICD-10-CM | POA: Diagnosis not present

## 2014-07-19 DIAGNOSIS — F419 Anxiety disorder, unspecified: Secondary | ICD-10-CM | POA: Diagnosis not present

## 2014-07-24 DIAGNOSIS — F329 Major depressive disorder, single episode, unspecified: Secondary | ICD-10-CM | POA: Diagnosis not present

## 2014-07-24 DIAGNOSIS — M1990 Unspecified osteoarthritis, unspecified site: Secondary | ICD-10-CM | POA: Diagnosis not present

## 2014-07-24 DIAGNOSIS — Z13820 Encounter for screening for osteoporosis: Secondary | ICD-10-CM | POA: Diagnosis not present

## 2014-07-24 DIAGNOSIS — Z780 Asymptomatic menopausal state: Secondary | ICD-10-CM | POA: Diagnosis not present

## 2014-07-24 DIAGNOSIS — F419 Anxiety disorder, unspecified: Secondary | ICD-10-CM | POA: Diagnosis not present

## 2014-07-24 DIAGNOSIS — S42201D Unspecified fracture of upper end of right humerus, subsequent encounter for fracture with routine healing: Secondary | ICD-10-CM | POA: Diagnosis not present

## 2014-07-24 DIAGNOSIS — K589 Irritable bowel syndrome without diarrhea: Secondary | ICD-10-CM | POA: Diagnosis not present

## 2014-07-24 LAB — HM DEXA SCAN

## 2014-07-26 DIAGNOSIS — Z471 Aftercare following joint replacement surgery: Secondary | ICD-10-CM | POA: Diagnosis not present

## 2014-07-26 DIAGNOSIS — K589 Irritable bowel syndrome without diarrhea: Secondary | ICD-10-CM | POA: Diagnosis not present

## 2014-07-26 DIAGNOSIS — S42201D Unspecified fracture of upper end of right humerus, subsequent encounter for fracture with routine healing: Secondary | ICD-10-CM | POA: Diagnosis not present

## 2014-07-26 DIAGNOSIS — F329 Major depressive disorder, single episode, unspecified: Secondary | ICD-10-CM | POA: Diagnosis not present

## 2014-07-26 DIAGNOSIS — Z96611 Presence of right artificial shoulder joint: Secondary | ICD-10-CM | POA: Diagnosis not present

## 2014-07-26 DIAGNOSIS — M1990 Unspecified osteoarthritis, unspecified site: Secondary | ICD-10-CM | POA: Diagnosis not present

## 2014-07-26 DIAGNOSIS — F419 Anxiety disorder, unspecified: Secondary | ICD-10-CM | POA: Diagnosis not present

## 2014-07-31 DIAGNOSIS — F419 Anxiety disorder, unspecified: Secondary | ICD-10-CM | POA: Diagnosis not present

## 2014-07-31 DIAGNOSIS — F329 Major depressive disorder, single episode, unspecified: Secondary | ICD-10-CM | POA: Diagnosis not present

## 2014-07-31 DIAGNOSIS — S42201D Unspecified fracture of upper end of right humerus, subsequent encounter for fracture with routine healing: Secondary | ICD-10-CM | POA: Diagnosis not present

## 2014-07-31 DIAGNOSIS — K589 Irritable bowel syndrome without diarrhea: Secondary | ICD-10-CM | POA: Diagnosis not present

## 2014-07-31 DIAGNOSIS — M1990 Unspecified osteoarthritis, unspecified site: Secondary | ICD-10-CM | POA: Diagnosis not present

## 2014-08-02 DIAGNOSIS — K589 Irritable bowel syndrome without diarrhea: Secondary | ICD-10-CM | POA: Diagnosis not present

## 2014-08-02 DIAGNOSIS — S42201D Unspecified fracture of upper end of right humerus, subsequent encounter for fracture with routine healing: Secondary | ICD-10-CM | POA: Diagnosis not present

## 2014-08-02 DIAGNOSIS — M1990 Unspecified osteoarthritis, unspecified site: Secondary | ICD-10-CM | POA: Diagnosis not present

## 2014-08-02 DIAGNOSIS — F329 Major depressive disorder, single episode, unspecified: Secondary | ICD-10-CM | POA: Diagnosis not present

## 2014-08-02 DIAGNOSIS — F419 Anxiety disorder, unspecified: Secondary | ICD-10-CM | POA: Diagnosis not present

## 2014-08-07 DIAGNOSIS — F329 Major depressive disorder, single episode, unspecified: Secondary | ICD-10-CM | POA: Diagnosis not present

## 2014-08-07 DIAGNOSIS — K589 Irritable bowel syndrome without diarrhea: Secondary | ICD-10-CM | POA: Diagnosis not present

## 2014-08-07 DIAGNOSIS — F419 Anxiety disorder, unspecified: Secondary | ICD-10-CM | POA: Diagnosis not present

## 2014-08-07 DIAGNOSIS — S42201D Unspecified fracture of upper end of right humerus, subsequent encounter for fracture with routine healing: Secondary | ICD-10-CM | POA: Diagnosis not present

## 2014-08-07 DIAGNOSIS — M1990 Unspecified osteoarthritis, unspecified site: Secondary | ICD-10-CM | POA: Diagnosis not present

## 2014-08-09 DIAGNOSIS — F329 Major depressive disorder, single episode, unspecified: Secondary | ICD-10-CM | POA: Diagnosis not present

## 2014-08-09 DIAGNOSIS — K589 Irritable bowel syndrome without diarrhea: Secondary | ICD-10-CM | POA: Diagnosis not present

## 2014-08-09 DIAGNOSIS — F419 Anxiety disorder, unspecified: Secondary | ICD-10-CM | POA: Diagnosis not present

## 2014-08-09 DIAGNOSIS — M1990 Unspecified osteoarthritis, unspecified site: Secondary | ICD-10-CM | POA: Diagnosis not present

## 2014-08-09 DIAGNOSIS — S42201D Unspecified fracture of upper end of right humerus, subsequent encounter for fracture with routine healing: Secondary | ICD-10-CM | POA: Diagnosis not present

## 2014-08-14 DIAGNOSIS — M1990 Unspecified osteoarthritis, unspecified site: Secondary | ICD-10-CM | POA: Diagnosis not present

## 2014-08-14 DIAGNOSIS — K589 Irritable bowel syndrome without diarrhea: Secondary | ICD-10-CM | POA: Diagnosis not present

## 2014-08-14 DIAGNOSIS — F419 Anxiety disorder, unspecified: Secondary | ICD-10-CM | POA: Diagnosis not present

## 2014-08-14 DIAGNOSIS — S42201D Unspecified fracture of upper end of right humerus, subsequent encounter for fracture with routine healing: Secondary | ICD-10-CM | POA: Diagnosis not present

## 2014-08-14 DIAGNOSIS — F329 Major depressive disorder, single episode, unspecified: Secondary | ICD-10-CM | POA: Diagnosis not present

## 2014-08-23 DIAGNOSIS — M1990 Unspecified osteoarthritis, unspecified site: Secondary | ICD-10-CM | POA: Diagnosis not present

## 2014-08-23 DIAGNOSIS — F419 Anxiety disorder, unspecified: Secondary | ICD-10-CM | POA: Diagnosis not present

## 2014-08-23 DIAGNOSIS — K589 Irritable bowel syndrome without diarrhea: Secondary | ICD-10-CM | POA: Diagnosis not present

## 2014-08-23 DIAGNOSIS — Z471 Aftercare following joint replacement surgery: Secondary | ICD-10-CM | POA: Diagnosis not present

## 2014-08-23 DIAGNOSIS — S42201D Unspecified fracture of upper end of right humerus, subsequent encounter for fracture with routine healing: Secondary | ICD-10-CM | POA: Diagnosis not present

## 2014-08-23 DIAGNOSIS — F329 Major depressive disorder, single episode, unspecified: Secondary | ICD-10-CM | POA: Diagnosis not present

## 2014-08-23 DIAGNOSIS — Z96611 Presence of right artificial shoulder joint: Secondary | ICD-10-CM | POA: Diagnosis not present

## 2014-08-31 DIAGNOSIS — M25611 Stiffness of right shoulder, not elsewhere classified: Secondary | ICD-10-CM | POA: Diagnosis not present

## 2014-09-03 DIAGNOSIS — M25611 Stiffness of right shoulder, not elsewhere classified: Secondary | ICD-10-CM | POA: Diagnosis not present

## 2014-09-04 DIAGNOSIS — H2513 Age-related nuclear cataract, bilateral: Secondary | ICD-10-CM | POA: Diagnosis not present

## 2014-09-04 DIAGNOSIS — H04123 Dry eye syndrome of bilateral lacrimal glands: Secondary | ICD-10-CM | POA: Diagnosis not present

## 2014-09-04 DIAGNOSIS — H40023 Open angle with borderline findings, high risk, bilateral: Secondary | ICD-10-CM | POA: Diagnosis not present

## 2014-09-04 DIAGNOSIS — H524 Presbyopia: Secondary | ICD-10-CM | POA: Diagnosis not present

## 2014-09-05 DIAGNOSIS — M25611 Stiffness of right shoulder, not elsewhere classified: Secondary | ICD-10-CM | POA: Diagnosis not present

## 2014-09-06 ENCOUNTER — Ambulatory Visit: Payer: Self-pay | Admitting: Internal Medicine

## 2014-09-07 ENCOUNTER — Ambulatory Visit (INDEPENDENT_AMBULATORY_CARE_PROVIDER_SITE_OTHER): Payer: Medicare Other | Admitting: Internal Medicine

## 2014-09-07 ENCOUNTER — Encounter: Payer: Self-pay | Admitting: Internal Medicine

## 2014-09-07 VITALS — BP 120/88 | HR 72 | Temp 98.2°F | Ht 62.25 in | Wt 174.0 lb

## 2014-09-07 DIAGNOSIS — F341 Dysthymic disorder: Secondary | ICD-10-CM | POA: Diagnosis not present

## 2014-09-07 DIAGNOSIS — Z0000 Encounter for general adult medical examination without abnormal findings: Secondary | ICD-10-CM | POA: Diagnosis not present

## 2014-09-07 DIAGNOSIS — S4290XS Fracture of unspecified shoulder girdle, part unspecified, sequela: Secondary | ICD-10-CM

## 2014-09-07 DIAGNOSIS — Z23 Encounter for immunization: Secondary | ICD-10-CM | POA: Diagnosis not present

## 2014-09-07 NOTE — Patient Instructions (Signed)
Limit your sodium (Salt) intake    It is important that you exercise regularly, at least 20 minutes 3 to 4 times per week.  If you develop chest pain or shortness of breath seek  medical attention.  You need to lose weight.  Consider a lower calorie diet and regular exercise.  Health Maintenance Adopting a healthy lifestyle and getting preventive care can go a long way to promote health and wellness. Talk with your health care provider about what schedule of regular examinations is right for you. This is a good chance for you to check in with your provider about disease prevention and staying healthy. In between checkups, there are plenty of things you can do on your own. Experts have done a lot of research about which lifestyle changes and preventive measures are most likely to keep you healthy. Ask your health care provider for more information. WEIGHT AND DIET  Eat a healthy diet  Be sure to include plenty of vegetables, fruits, low-fat dairy products, and lean protein.  Do not eat a lot of foods high in solid fats, added sugars, or salt.  Get regular exercise. This is one of the most important things you can do for your health.  Most adults should exercise for at least 150 minutes each week. The exercise should increase your heart rate and make you sweat (moderate-intensity exercise).  Most adults should also do strengthening exercises at least twice a week. This is in addition to the moderate-intensity exercise.  Maintain a healthy weight  Body mass index (BMI) is a measurement that can be used to identify possible weight problems. It estimates body fat based on height and weight. Your health care provider can help determine your BMI and help you achieve or maintain a healthy weight.  For females 20 years of age and older:   A BMI below 18.5 is considered underweight.  A BMI of 18.5 to 24.9 is normal.  A BMI of 25 to 29.9 is considered overweight.  A BMI of 30 and above is  considered obese.  Watch levels of cholesterol and blood lipids  You should start having your blood tested for lipids and cholesterol at 68 years of age, then have this test every 5 years.  You may need to have your cholesterol levels checked more often if:  Your lipid or cholesterol levels are high.  You are older than 68 years of age.  You are at high risk for heart disease.  CANCER SCREENING   Lung Cancer  Lung cancer screening is recommended for adults 55-80 years old who are at high risk for lung cancer because of a history of smoking.  A yearly low-dose CT scan of the lungs is recommended for people who:  Currently smoke.  Have quit within the past 15 years.  Have at least a 30-pack-year history of smoking. A pack year is smoking an average of one pack of cigarettes a day for 1 year.  Yearly screening should continue until it has been 15 years since you quit.  Yearly screening should stop if you develop a health problem that would prevent you from having lung cancer treatment.  Breast Cancer  Practice breast self-awareness. This means understanding how your breasts normally appear and feel.  It also means doing regular breast self-exams. Let your health care provider know about any changes, no matter how small.  If you are in your 20s or 30s, you should have a clinical breast exam (CBE) by a health care   provider every 1-3 years as part of a regular health exam.  If you are 57 or older, have a CBE every year. Also consider having a breast X-ray (mammogram) every year.  If you have a family history of breast cancer, talk to your health care provider about genetic screening.  If you are at high risk for breast cancer, talk to your health care provider about having an MRI and a mammogram every year.  Breast cancer gene (BRCA) assessment is recommended for women who have family members with BRCA-related cancers. BRCA-related cancers  include:  Breast.  Ovarian.  Tubal.  Peritoneal cancers.  Results of the assessment will determine the need for genetic counseling and BRCA1 and BRCA2 testing. Cervical Cancer Routine pelvic examinations to screen for cervical cancer are no longer recommended for nonpregnant women who are considered low risk for cancer of the pelvic organs (ovaries, uterus, and vagina) and who do not have symptoms. A pelvic examination may be necessary if you have symptoms including those associated with pelvic infections. Ask your health care provider if a screening pelvic exam is right for you.   The Pap test is the screening test for cervical cancer for women who are considered at risk.  If you had a hysterectomy for a problem that was not cancer or a condition that could lead to cancer, then you no longer need Pap tests.  If you are older than 65 years, and you have had normal Pap tests for the past 10 years, you no longer need to have Pap tests.  If you have had past treatment for cervical cancer or a condition that could lead to cancer, you need Pap tests and screening for cancer for at least 20 years after your treatment.  If you no longer get a Pap test, assess your risk factors if they change (such as having a new sexual partner). This can affect whether you should start being screened again.  Some women have medical problems that increase their chance of getting cervical cancer. If this is the case for you, your health care provider may recommend more frequent screening and Pap tests.  The human papillomavirus (HPV) test is another test that may be used for cervical cancer screening. The HPV test looks for the virus that can cause cell changes in the cervix. The cells collected during the Pap test can be tested for HPV.  The HPV test can be used to screen women 24 years of age and older. Getting tested for HPV can extend the interval between normal Pap tests from three to five years.  An HPV  test also should be used to screen women of any age who have unclear Pap test results.  After 68 years of age, women should have HPV testing as often as Pap tests.  Colorectal Cancer  This type of cancer can be detected and often prevented.  Routine colorectal cancer screening usually begins at 68 years of age and continues through 68 years of age.  Your health care provider may recommend screening at an earlier age if you have risk factors for colon cancer.  Your health care provider may also recommend using home test kits to check for hidden blood in the stool.  A small camera at the end of a tube can be used to examine your colon directly (sigmoidoscopy or colonoscopy). This is done to check for the earliest forms of colorectal cancer.  Routine screening usually begins at age 22.  Direct examination of the colon  should be repeated every 5-10 years through 68 years of age. However, you may need to be screened more often if early forms of precancerous polyps or small growths are found. Skin Cancer  Check your skin from head to toe regularly.  Tell your health care provider about any new moles or changes in moles, especially if there is a change in a mole's shape or color.  Also tell your health care provider if you have a mole that is larger than the size of a pencil eraser.  Always use sunscreen. Apply sunscreen liberally and repeatedly throughout the day.  Protect yourself by wearing long sleeves, pants, a wide-brimmed hat, and sunglasses whenever you are outside. HEART DISEASE, DIABETES, AND HIGH BLOOD PRESSURE   Have your blood pressure checked at least every 1-2 years. High blood pressure causes heart disease and increases the risk of stroke.  If you are between 55 years and 79 years old, ask your health care provider if you should take aspirin to prevent strokes.  Have regular diabetes screenings. This involves taking a blood sample to check your fasting blood sugar  level.  If you are at a normal weight and have a low risk for diabetes, have this test once every three years after 68 years of age.  If you are overweight and have a high risk for diabetes, consider being tested at a younger age or more often. PREVENTING INFECTION  Hepatitis B  If you have a higher risk for hepatitis B, you should be screened for this virus. You are considered at high risk for hepatitis B if:  You were born in a country where hepatitis B is common. Ask your health care provider which countries are considered high risk.  Your parents were born in a high-risk country, and you have not been immunized against hepatitis B (hepatitis B vaccine).  You have HIV or AIDS.  You use needles to inject street drugs.  You live with someone who has hepatitis B.  You have had sex with someone who has hepatitis B.  You get hemodialysis treatment.  You take certain medicines for conditions, including cancer, organ transplantation, and autoimmune conditions. Hepatitis C  Blood testing is recommended for:  Everyone born from 1945 through 1965.  Anyone with known risk factors for hepatitis C. Sexually transmitted infections (STIs)  You should be screened for sexually transmitted infections (STIs) including gonorrhea and chlamydia if:  You are sexually active and are younger than 68 years of age.  You are older than 68 years of age and your health care provider tells you that you are at risk for this type of infection.  Your sexual activity has changed since you were last screened and you are at an increased risk for chlamydia or gonorrhea. Ask your health care provider if you are at risk.  If you do not have HIV, but are at risk, it may be recommended that you take a prescription medicine daily to prevent HIV infection. This is called pre-exposure prophylaxis (PrEP). You are considered at risk if:  You are sexually active and do not regularly use condoms or know the HIV status  of your partner(s).  You take drugs by injection.  You are sexually active with a partner who has HIV. Talk with your health care provider about whether you are at high risk of being infected with HIV. If you choose to begin PrEP, you should first be tested for HIV. You should then be tested every 3 months for   as long as you are taking PrEP.  PREGNANCY   If you are premenopausal and you may become pregnant, ask your health care provider about preconception counseling.  If you may become pregnant, take 400 to 800 micrograms (mcg) of folic acid every day.  If you want to prevent pregnancy, talk to your health care provider about birth control (contraception). OSTEOPOROSIS AND MENOPAUSE   Osteoporosis is a disease in which the bones lose minerals and strength with aging. This can result in serious bone fractures. Your risk for osteoporosis can be identified using a bone density scan.  If you are 65 years of age or older, or if you are at risk for osteoporosis and fractures, ask your health care provider if you should be screened.  Ask your health care provider whether you should take a calcium or vitamin D supplement to lower your risk for osteoporosis.  Menopause may have certain physical symptoms and risks.  Hormone replacement therapy may reduce some of these symptoms and risks. Talk to your health care provider about whether hormone replacement therapy is right for you.  HOME CARE INSTRUCTIONS   Schedule regular health, dental, and eye exams.  Stay current with your immunizations.   Do not use any tobacco products including cigarettes, chewing tobacco, or electronic cigarettes.  If you are pregnant, do not drink alcohol.  If you are breastfeeding, limit how much and how often you drink alcohol.  Limit alcohol intake to no more than 1 drink per day for nonpregnant women. One drink equals 12 ounces of beer, 5 ounces of wine, or 1 ounces of hard liquor.  Do not use street  drugs.  Do not share needles.  Ask your health care provider for help if you need support or information about quitting drugs.  Tell your health care provider if you often feel depressed.  Tell your health care provider if you have ever been abused or do not feel safe at home. Document Released: 08/18/2010 Document Revised: 06/19/2013 Document Reviewed: 01/04/2013 [employer] Patient Information 2015 [employer]. This information is not intended to replace advice given to you by your health care provider. Make sure you discuss any questions you have with your health care provider.  

## 2014-09-07 NOTE — Progress Notes (Signed)
Pre visit review using our clinic review tool, if applicable. No additional management support is needed unless otherwise documented below in the visit note. 

## 2014-09-07 NOTE — Progress Notes (Signed)
Subjective:    Patient ID: Brianna Mack, female    DOB: [birth year], 69 y.o.   MRN: [id number]  HPI   Subjective:    Patient ID: Brianna Mack, female    DOB: 1945/06/21, 69 y.o.   MRN: [id number]  HPI  69 year old patient who is seen today for a health maintenance examination.  She is followed by gynecology. She is a history of IBS and is also followed by Dr. Earlean Shawl. She is scheduled for a followup colonoscopy in about one year. Her IBS has been fairly stable. She does have a history of mild anxiety depression which has been well-controlled with citalopram. She also takes when necessary alprazolam. No concerns or complaints. No recent lab   Past Medical History  Diagnosis Date  . Depression   . Anxiety   . IBS (irritable bowel syndrome)   . Arthritis     History   Social History  . Marital Status: Married    Spouse Name: N/A  . Number of Children: N/A  . Years of Education: N/A   Occupational History  . Not on file.   Social History Main Topics  . Smoking status: Former Smoker    Quit date: 08/03/1977  . Smokeless tobacco: Never Used  . Alcohol Use: 0.0 oz/week    0 Standard drinks or equivalent per week     Comment: occasionally  . Drug Use: No  . Sexual Activity: Not Currently   Other Topics Concern  . Not on file   Social History Narrative    Past Surgical History  Procedure Laterality Date  . Cholecystectomy  1976  . Tonsillectomy  1954  . Dilation and curettage of uterus    . Hand surgery  04-02-2011    right  . Oophorectomy      BSO  . Abdominal hysterectomy  2001    BSO ; SLING PROCEDURE endometriosis/leiomyomata  . Orif shoulder fracture Right 06/28/2014    Procedure: RIGHT SHOULDER HEMI-ARTHROPLASTY;  Surgeon: Netta Cedars, MD;  Location: [facility];  Service: Orthopedics;  Laterality: Right;    Family History  Problem Relation Age of Onset  . Diabetes Mother   . Hypertension Mother   . Cancer Mother     Laurell Roof  . COPD Father      Allergies  Allergen Reactions  . Augmentin [Amoxicillin-Pot Clavulanate] Itching and Swelling    Headache  . Cephalexin Diarrhea  . Codeine Sulfate     REACTION: nausea, vomiting  . Fluconazole     REACTION: swelling and itching  . Morphine     REACTION: nausea, itching  . Sulfonamide Derivatives     REACTION: rash    Current Outpatient Prescriptions on File Prior to Visit  Medication Sig Dispense Refill  . Acetaminophen (TYLENOL ARTHRITIS PAIN PO) Take 1,000 mg by mouth 2 (two) times daily as needed (back pain).     Marland Kitchen ALPRAZolam (XANAX) 0.25 MG tablet Take 1 tablet (0.25 mg total) by mouth at bedtime as needed for anxiety. 30 tablet 4  . Calcium Carbonate-Vitamin D (CALCIUM + D PO) Take 1 tablet by mouth daily.     . citalopram (CELEXA) 20 MG tablet Take 1 tablet (20 mg total) by mouth daily. 30 tablet 12  . estradiol (ESTRACE) 0.5 MG tablet Take 1 tablet (0.5 mg total) by mouth daily. 30 tablet 11  . hyoscyamine (LEVBID) 0.375 MG 12 hr tablet Take 0.375 mg by mouth daily.     . Lactobacillus (ACIDOPHILUS PO) Take by  mouth daily.    . methocarbamol (ROBAXIN) 500 MG tablet Take 1 tablet (500 mg total) by mouth every 6 (six) hours as needed for muscle spasms. 60 tablet 0  . guaiFENesin (MUCINEX) 600 MG 12 hr tablet Take 600 mg by mouth 2 (two) times daily as needed for cough.     No current facility-administered medications on file prior to visit.    BP 120/88 mmHg  Pulse 72  Temp(Src) 98.2 F (36.8 C) (Oral)  Ht 5' 2.25" (1.581 m)  Wt 174 lb (78.926 kg)  BMI 31.58 kg/m2   1. Risk factors, based on past  M,S,F history.  Note.  Cardiovascular risk factors   2.  Physical activities:  Fairly sedentary but no activity restrictions  3.  Depression/mood: History mild anxiety, depression, which has been stable.  Does use when necessary alprazolam  4.  Hearing: No deficits  5.  ADL's: Independent.  Primary caregiver for a disabled husband  6.  Fall risk: Low  7.  Home  safety: No problems identified   8.  Height weight, and visual acuity; height and weight stable no change in visual acuity does see Dr. Gershon Crane annually  9.  Counseling: Heart healthy diet more regular exercise modest weight loss.  All encouraged  10. Lab orders based on risk factors: Laboratory profile reviewed  11. Referral : Follow-up OB/GYN and GI in 18 months  12. Care plan: Heart healthy diet and continue aggressive risk factor modification  13. Cognitive assessment: Alert and oriented with normal affect.  No cognitive dysfunction  14. Screening: Patient provided with a written and personalized 5-10 year screening schedule in the AVS.  .  Patient encouraged to have screening colonoscopies at 10 year intervals.  We'll continue annual eye and gynecologic examination with annual mammograms  15. Provider List Update: GI primary care ophthalmology and OB/GYN as well as orthopedics   Review of Systems  Constitutional: Negative.   HENT: Negative for hearing loss, congestion, sore throat, rhinorrhea, dental problem, sinus pressure and tinnitus.   Eyes: Negative for pain, discharge and visual disturbance.  Respiratory: Negative for cough and shortness of breath.   Cardiovascular: Negative for chest pain, palpitations and leg swelling.  Gastrointestinal: Negative for nausea, vomiting, abdominal pain, diarrhea, constipation, blood in stool and abdominal distention.  Genitourinary: Negative for dysuria, urgency, frequency, hematuria, flank pain, vaginal bleeding, vaginal discharge, difficulty urinating, vaginal pain and pelvic pain.  Musculoskeletal: Negative for joint swelling, arthralgias and gait problem.  Skin: Negative for rash.  Neurological: Negative for dizziness, syncope, speech difficulty, weakness, numbness and headaches.  Hematological: Negative for adenopathy.  Psychiatric/Behavioral: Negative for behavioral problems, dysphoric mood and agitation. The patient is not  nervous/anxious.        Objective:   Physical Exam  Constitutional: She is oriented to person, place, and time. She appears well-developed and well-nourished.  HENT:  Head: Normocephalic.  Right Ear: External ear normal.  Left Ear: External ear normal.  Mouth/Throat: Oropharynx is clear and moist.  Eyes: Conjunctivae and EOM are normal. Pupils are equal, round, and reactive to light.  Neck: Normal range of motion. Neck supple. No thyromegaly present.  Cardiovascular: Normal rate, regular rhythm, normal heart sounds and intact distal pulses.   Pulmonary/Chest: Effort normal and breath sounds normal.  Abdominal: Soft. Bowel sounds are normal. She exhibits no mass. There is no tenderness.  Musculoskeletal: Normal range of motion.  Lymphadenopathy:    She has no cervical adenopathy.  Neurological: She is alert and oriented  to person, place, and time.  Skin: Skin is warm and dry. No rash noted.  Psychiatric: She has a normal mood and affect. Her behavior is normal.          Assessment & Plan:   Preventive health exam History of IBS. Followup GI will refill Levbid. 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  Alprazolam refilled  CPX 12 months

## 2014-09-10 DIAGNOSIS — M25611 Stiffness of right shoulder, not elsewhere classified: Secondary | ICD-10-CM | POA: Diagnosis not present

## 2014-09-13 DIAGNOSIS — M25611 Stiffness of right shoulder, not elsewhere classified: Secondary | ICD-10-CM | POA: Diagnosis not present

## 2014-09-16 ENCOUNTER — Other Ambulatory Visit: Payer: Self-pay | Admitting: Gynecology

## 2014-09-17 DIAGNOSIS — M25611 Stiffness of right shoulder, not elsewhere classified: Secondary | ICD-10-CM | POA: Diagnosis not present

## 2014-09-17 NOTE — Telephone Encounter (Signed)
Annual on 10/24/14

## 2014-09-19 DIAGNOSIS — M25611 Stiffness of right shoulder, not elsewhere classified: Secondary | ICD-10-CM | POA: Diagnosis not present

## 2014-09-20 DIAGNOSIS — S46012D Strain of muscle(s) and tendon(s) of the rotator cuff of left shoulder, subsequent encounter: Secondary | ICD-10-CM | POA: Diagnosis not present

## 2014-09-21 ENCOUNTER — Telehealth: Payer: Self-pay | Admitting: Internal Medicine

## 2014-09-21 NOTE — Telephone Encounter (Signed)
Left message on machine for patient to return our call 

## 2014-09-21 NOTE — Telephone Encounter (Signed)
Patient would like to know if there is someone else not at [facility]? She would like a second opinion before going through surgery again and Dr Onnie Graham is in the same office as the other provider.

## 2014-09-21 NOTE — Telephone Encounter (Signed)
Pt ask if Dr Raliegh Ip has a recommendation of a orthpedic doctor to do shoulder surgery.

## 2014-09-21 NOTE — Telephone Encounter (Signed)
Dr Justice Britain

## 2014-09-21 NOTE — Telephone Encounter (Signed)
Dr Percell Miller

## 2014-09-21 NOTE — Telephone Encounter (Signed)
Patient is aware 

## 2014-09-26 DIAGNOSIS — Z96611 Presence of right artificial shoulder joint: Secondary | ICD-10-CM | POA: Diagnosis not present

## 2014-09-26 DIAGNOSIS — Z471 Aftercare following joint replacement surgery: Secondary | ICD-10-CM | POA: Diagnosis not present

## 2014-09-26 NOTE — Progress Notes (Unsigned)
   Subjective:    Patient ID: Brianna Mack, female    DOB: Nov 27, 1945, 69 y.o.   MRN: [id number]  HPI    Review of Systems     Objective:   Physical Exam        Assessment & Plan:

## 2014-09-27 ENCOUNTER — Other Ambulatory Visit: Payer: Self-pay | Admitting: Orthopedic Surgery

## 2014-09-27 DIAGNOSIS — Z96611 Presence of right artificial shoulder joint: Principal | ICD-10-CM

## 2014-09-27 DIAGNOSIS — Z471 Aftercare following joint replacement surgery: Secondary | ICD-10-CM

## 2014-10-01 ENCOUNTER — Ambulatory Visit
Admission: RE | Admit: 2014-10-01 | Discharge: 2014-10-01 | Disposition: A | Discharge status: Home / Self Care | Payer: Medicare Other | Source: Ambulatory Visit | Attending: Orthopedic Surgery | Admitting: Orthopedic Surgery

## 2014-10-01 DIAGNOSIS — Z471 Aftercare following joint replacement surgery: Secondary | ICD-10-CM | POA: Diagnosis not present

## 2014-10-01 DIAGNOSIS — Z96611 Presence of right artificial shoulder joint: Secondary | ICD-10-CM | POA: Diagnosis not present

## 2014-10-03 DIAGNOSIS — M25511 Pain in right shoulder: Secondary | ICD-10-CM | POA: Diagnosis not present

## 2014-10-09 DIAGNOSIS — M25611 Stiffness of right shoulder, not elsewhere classified: Secondary | ICD-10-CM | POA: Diagnosis not present

## 2014-10-11 DIAGNOSIS — M25611 Stiffness of right shoulder, not elsewhere classified: Secondary | ICD-10-CM | POA: Diagnosis not present

## 2014-10-15 DIAGNOSIS — M25611 Stiffness of right shoulder, not elsewhere classified: Secondary | ICD-10-CM | POA: Diagnosis not present

## 2014-10-18 DIAGNOSIS — M25611 Stiffness of right shoulder, not elsewhere classified: Secondary | ICD-10-CM | POA: Diagnosis not present

## 2014-10-19 DIAGNOSIS — M6281 Muscle weakness (generalized): Secondary | ICD-10-CM | POA: Diagnosis not present

## 2014-10-23 DIAGNOSIS — M25611 Stiffness of right shoulder, not elsewhere classified: Secondary | ICD-10-CM | POA: Diagnosis not present

## 2014-10-24 ENCOUNTER — Ambulatory Visit (INDEPENDENT_AMBULATORY_CARE_PROVIDER_SITE_OTHER): Payer: Medicare Other | Admitting: Gynecology

## 2014-10-24 ENCOUNTER — Encounter: Payer: Self-pay | Admitting: Gynecology

## 2014-10-24 ENCOUNTER — Other Ambulatory Visit (HOSPITAL_COMMUNITY)
Admission: RE | Admit: 2014-10-24 | Discharge: 2014-10-24 | Disposition: A | Discharge status: Home / Self Care | Payer: Medicare Other | Source: Ambulatory Visit | Attending: Gynecology | Admitting: Gynecology

## 2014-10-24 VITALS — BP 128/80 | Ht 62.0 in | Wt 174.0 lb

## 2014-10-24 DIAGNOSIS — Z01419 Encounter for gynecological examination (general) (routine) without abnormal findings: Secondary | ICD-10-CM | POA: Diagnosis not present

## 2014-10-24 DIAGNOSIS — M6281 Muscle weakness (generalized): Secondary | ICD-10-CM | POA: Diagnosis not present

## 2014-10-24 DIAGNOSIS — Z79890 Hormone replacement therapy (postmenopausal): Secondary | ICD-10-CM

## 2014-10-24 DIAGNOSIS — G5690 Unspecified mononeuropathy of unspecified upper limb: Secondary | ICD-10-CM | POA: Diagnosis not present

## 2014-10-24 DIAGNOSIS — Z124 Encounter for screening for malignant neoplasm of cervix: Secondary | ICD-10-CM | POA: Diagnosis not present

## 2014-10-24 DIAGNOSIS — F341 Dysthymic disorder: Secondary | ICD-10-CM | POA: Diagnosis not present

## 2014-10-24 DIAGNOSIS — N952 Postmenopausal atrophic vaginitis: Secondary | ICD-10-CM

## 2014-10-24 MED ORDER — CITALOPRAM HYDROBROMIDE 20 MG PO TABS: 20.0000 mg | ORAL_TABLET | Freq: Every day | ORAL | Status: DC

## 2014-10-24 MED ORDER — ALPRAZOLAM 0.25 MG PO TABS: 0.2500 mg | ORAL_TABLET | Freq: Every evening | ORAL | Status: DC | PRN

## 2014-10-24 MED ORDER — ESTRADIOL 0.5 MG PO TABS: 0.5000 mg | ORAL_TABLET | Freq: Every day | ORAL | Status: DC

## 2014-10-24 NOTE — Progress Notes (Signed)
Brianna Mack [facility] June 02, 1945 [id number]        69 y.o.  G0P0000 for breast and pelvic exam. Several issues noted below.  Past medical history,surgical history, problem list, medications, allergies, family history and social history were all reviewed and documented as reviewed in the EPIC chart.  ROS:  Performed with pertinent positives and negatives included in the history, assessment and plan.   Additional significant findings :  none   Exam: Brianna Mack Vitals:   10/24/14 1033  BP: 128/80  Height: 5\' 2"  (1.575 m)  Weight: 174 lb (78.926 kg)   General appearance:  Normal affect, orientation and appearance. Skin: Grossly normal HEENT: Without gross lesions.  No cervical or supraclavicular adenopathy. Thyroid normal.  Lungs:  Clear without wheezing, rales or rhonchi Cardiac: RR, without RMG Abdominal:  Soft, nontender, without masses, guarding, rebound, organomegaly or hernia Breasts:  Examined lying and sitting without masses, retractions, discharge or axillary adenopathy. Pelvic:  Ext/BUS/vagina with atrophic changes. Pap smear cuff done  Adnexa  Without masses or tenderness    Anus and perineum  Normal   Rectovaginal  Normal sphincter tone without palpated masses or tenderness.    Assessment/Plan:  69 y.o. G0P0000 female for breast and pelvic exam.   1. Postmenopausal/HRT. Status post TAH/BSO for endometriosis leiomyoma 2001. On Estrace 0.5 mg daily. Feels better when she is on this. Has tried stopping it with hot flashes and sweats. I again reviewed the whole issue of HRT and went to wean. Risks to include the WHI study with increased risk of stroke heart attack DVT and breast cancer all discussed. At this point the patient wants to continue and I refilled her 1 year. 2. Anxiety. Patient continues on Celexa 20 mg daily and Xanax 0.25 mg when necessary. Is stable on this regimen and wants to continue. Continues to deal with her husband who had a stroke and is  disabled. Refill times both 1 year provided. 3. Pap smear 2012. Pap smear done today. Options to stop screening as she is over the age 39 and status post hysterectomy for benign indications versus less frequent screening interval reviewed. Will readdress on annual basis. 4. Mammography do now she knows to schedule. SBE monthly reviewed. 5. Colonoscopy 2009. Is seen by Dr. Earlean Shawl on a regular basis and will follow up with him for repeat colonoscopy when due. 6. DEXA 2011 normal. Options to repeat now at 5 year interval versus waiting until age 78 discussed. She currently continues on HRT and I think it's reasonable to wait to 71 and she agrees with this. Increased calcium vitamin D reviewed. 7. Health maintenance. No routine blood work done as she has this done at her primary physician's office. Follow up 1 year, sooner as needed.   Anastasio Auerbach MD, 11:21 AM 10/24/2014

## 2014-10-24 NOTE — Patient Instructions (Signed)

## 2014-10-24 NOTE — Addendum Note (Signed)
Addended by: Burnett Kanaris on: 10/24/2014 11:37 AM   Modules accepted: Orders

## 2014-10-25 LAB — CYTOLOGY - PAP

## 2014-10-26 DIAGNOSIS — M25611 Stiffness of right shoulder, not elsewhere classified: Secondary | ICD-10-CM | POA: Diagnosis not present

## 2014-10-29 DIAGNOSIS — G5690 Unspecified mononeuropathy of unspecified upper limb: Secondary | ICD-10-CM | POA: Diagnosis not present

## 2014-10-30 DIAGNOSIS — M25611 Stiffness of right shoulder, not elsewhere classified: Secondary | ICD-10-CM | POA: Diagnosis not present

## 2014-11-01 DIAGNOSIS — Z471 Aftercare following joint replacement surgery: Secondary | ICD-10-CM | POA: Diagnosis not present

## 2014-11-01 DIAGNOSIS — Z96611 Presence of right artificial shoulder joint: Secondary | ICD-10-CM | POA: Diagnosis not present

## 2014-11-02 DIAGNOSIS — M25611 Stiffness of right shoulder, not elsewhere classified: Secondary | ICD-10-CM | POA: Diagnosis not present

## 2014-11-05 DIAGNOSIS — M25611 Stiffness of right shoulder, not elsewhere classified: Secondary | ICD-10-CM | POA: Diagnosis not present

## 2014-11-08 DIAGNOSIS — M25611 Stiffness of right shoulder, not elsewhere classified: Secondary | ICD-10-CM | POA: Diagnosis not present

## 2014-11-12 DIAGNOSIS — M25611 Stiffness of right shoulder, not elsewhere classified: Secondary | ICD-10-CM | POA: Diagnosis not present

## 2014-11-13 DIAGNOSIS — M6281 Muscle weakness (generalized): Secondary | ICD-10-CM | POA: Diagnosis not present

## 2014-11-13 DIAGNOSIS — M25511 Pain in right shoulder: Secondary | ICD-10-CM | POA: Diagnosis not present

## 2014-11-13 DIAGNOSIS — G5690 Unspecified mononeuropathy of unspecified upper limb: Secondary | ICD-10-CM | POA: Diagnosis not present

## 2014-11-15 DIAGNOSIS — M6281 Muscle weakness (generalized): Secondary | ICD-10-CM | POA: Diagnosis not present

## 2014-11-15 DIAGNOSIS — M25511 Pain in right shoulder: Secondary | ICD-10-CM | POA: Diagnosis not present

## 2014-11-15 DIAGNOSIS — G5690 Unspecified mononeuropathy of unspecified upper limb: Secondary | ICD-10-CM | POA: Diagnosis not present

## 2014-11-20 DIAGNOSIS — G5690 Unspecified mononeuropathy of unspecified upper limb: Secondary | ICD-10-CM | POA: Diagnosis not present

## 2014-11-20 DIAGNOSIS — M6281 Muscle weakness (generalized): Secondary | ICD-10-CM | POA: Diagnosis not present

## 2014-11-20 DIAGNOSIS — M25511 Pain in right shoulder: Secondary | ICD-10-CM | POA: Diagnosis not present

## 2014-11-23 DIAGNOSIS — K219 Gastro-esophageal reflux disease without esophagitis: Secondary | ICD-10-CM | POA: Diagnosis not present

## 2014-11-23 DIAGNOSIS — K589 Irritable bowel syndrome without diarrhea: Secondary | ICD-10-CM | POA: Diagnosis not present

## 2014-11-23 DIAGNOSIS — G5690 Unspecified mononeuropathy of unspecified upper limb: Secondary | ICD-10-CM | POA: Diagnosis not present

## 2014-11-23 DIAGNOSIS — M25511 Pain in right shoulder: Secondary | ICD-10-CM | POA: Diagnosis not present

## 2014-11-23 DIAGNOSIS — M6281 Muscle weakness (generalized): Secondary | ICD-10-CM | POA: Diagnosis not present

## 2014-11-27 DIAGNOSIS — G5690 Unspecified mononeuropathy of unspecified upper limb: Secondary | ICD-10-CM | POA: Diagnosis not present

## 2014-11-27 DIAGNOSIS — M6281 Muscle weakness (generalized): Secondary | ICD-10-CM | POA: Diagnosis not present

## 2014-11-27 DIAGNOSIS — M25511 Pain in right shoulder: Secondary | ICD-10-CM | POA: Diagnosis not present

## 2014-11-27 DIAGNOSIS — M7752 Other enthesopathy of left foot: Secondary | ICD-10-CM | POA: Diagnosis not present

## 2014-11-30 DIAGNOSIS — M6281 Muscle weakness (generalized): Secondary | ICD-10-CM | POA: Diagnosis not present

## 2014-11-30 DIAGNOSIS — G5690 Unspecified mononeuropathy of unspecified upper limb: Secondary | ICD-10-CM | POA: Diagnosis not present

## 2014-11-30 DIAGNOSIS — M25511 Pain in right shoulder: Secondary | ICD-10-CM | POA: Diagnosis not present

## 2014-12-03 DIAGNOSIS — G5690 Unspecified mononeuropathy of unspecified upper limb: Secondary | ICD-10-CM | POA: Diagnosis not present

## 2014-12-03 DIAGNOSIS — M6281 Muscle weakness (generalized): Secondary | ICD-10-CM | POA: Diagnosis not present

## 2014-12-03 DIAGNOSIS — M25511 Pain in right shoulder: Secondary | ICD-10-CM | POA: Diagnosis not present

## 2014-12-05 ENCOUNTER — Ambulatory Visit (INDEPENDENT_AMBULATORY_CARE_PROVIDER_SITE_OTHER): Payer: Medicare Other | Admitting: Podiatry

## 2014-12-05 ENCOUNTER — Encounter: Payer: Self-pay | Admitting: Podiatry

## 2014-12-05 DIAGNOSIS — B351 Tinea unguium: Secondary | ICD-10-CM | POA: Diagnosis not present

## 2014-12-05 DIAGNOSIS — M79673 Pain in unspecified foot: Secondary | ICD-10-CM | POA: Diagnosis not present

## 2014-12-05 DIAGNOSIS — M79609 Pain in unspecified limb: Principal | ICD-10-CM

## 2014-12-05 NOTE — Progress Notes (Signed)
Patient ID: Brianna Mack, female   DOB: 05/14/1945, 69 y.o.   MRN: [id number] Complaint:  Visit Type: Patient presents to my office for continued preventative foot care services. Complaint: Patient states" my nails have grown long and thick and become painful to walk and wear shoes" . The patient presents for preventative foot care services.   Podiatric Exam: Vascular: dorsalis pedis and posterior tibial pulses are palpable bilateral. Capillary return is immediate. Temperature gradient is WNL. Skin turgor WNL  Sensorium: Normal Semmes Weinstein monofilament test. Normal tactile sensation bilaterally. Nail Exam: Pt has thick disfigured discolored nails with subungual debris noted bilateral entire nail hallux through fifth toenails Ulcer Exam: There is no evidence of ulcer or pre-ulcerative changes or infection. Orthopedic Exam: Muscle tone and strength are WNL. No limitations in general ROM. No crepitus or effusions noted. Foot type and digits show no abnormalities. Bony prominences are unremarkable. Skin: No Porokeratosis. No infection or ulcers  Diagnosis:  Onychomycosis, , Pain in right toe, pain in left toes  Treatment & Plan Procedures and Treatment: Consent by patient was obtained for treatment procedures. The patient understood the discussion of treatment and procedures well. All questions were answered thoroughly reviewed. Debridement of mycotic and hypertrophic toenails, 1 through 5 bilateral and clearing of subungual debris. No ulceration, no infection noted.  Return Visit-Office Procedure: Patient instructed to return to the office for a follow up visit 3 months for continued evaluation and treatment.

## 2014-12-07 DIAGNOSIS — M25511 Pain in right shoulder: Secondary | ICD-10-CM | POA: Diagnosis not present

## 2014-12-07 DIAGNOSIS — M6281 Muscle weakness (generalized): Secondary | ICD-10-CM | POA: Diagnosis not present

## 2014-12-07 DIAGNOSIS — G5690 Unspecified mononeuropathy of unspecified upper limb: Secondary | ICD-10-CM | POA: Diagnosis not present

## 2014-12-10 DIAGNOSIS — M6281 Muscle weakness (generalized): Secondary | ICD-10-CM | POA: Diagnosis not present

## 2014-12-10 DIAGNOSIS — G5690 Unspecified mononeuropathy of unspecified upper limb: Secondary | ICD-10-CM | POA: Diagnosis not present

## 2014-12-10 DIAGNOSIS — M25511 Pain in right shoulder: Secondary | ICD-10-CM | POA: Diagnosis not present

## 2014-12-13 DIAGNOSIS — M6281 Muscle weakness (generalized): Secondary | ICD-10-CM | POA: Diagnosis not present

## 2014-12-13 DIAGNOSIS — G5690 Unspecified mononeuropathy of unspecified upper limb: Secondary | ICD-10-CM | POA: Diagnosis not present

## 2014-12-13 DIAGNOSIS — M25511 Pain in right shoulder: Secondary | ICD-10-CM | POA: Diagnosis not present

## 2014-12-14 DIAGNOSIS — L820 Inflamed seborrheic keratosis: Secondary | ICD-10-CM | POA: Diagnosis not present

## 2014-12-17 DIAGNOSIS — G5690 Unspecified mononeuropathy of unspecified upper limb: Secondary | ICD-10-CM | POA: Diagnosis not present

## 2014-12-17 DIAGNOSIS — M25511 Pain in right shoulder: Secondary | ICD-10-CM | POA: Diagnosis not present

## 2014-12-17 DIAGNOSIS — M6281 Muscle weakness (generalized): Secondary | ICD-10-CM | POA: Diagnosis not present

## 2014-12-21 DIAGNOSIS — M6281 Muscle weakness (generalized): Secondary | ICD-10-CM | POA: Diagnosis not present

## 2014-12-21 DIAGNOSIS — M25511 Pain in right shoulder: Secondary | ICD-10-CM | POA: Diagnosis not present

## 2014-12-21 DIAGNOSIS — G5690 Unspecified mononeuropathy of unspecified upper limb: Secondary | ICD-10-CM | POA: Diagnosis not present

## 2014-12-25 DIAGNOSIS — G5690 Unspecified mononeuropathy of unspecified upper limb: Secondary | ICD-10-CM | POA: Diagnosis not present

## 2014-12-25 DIAGNOSIS — M6281 Muscle weakness (generalized): Secondary | ICD-10-CM | POA: Diagnosis not present

## 2014-12-25 DIAGNOSIS — M25511 Pain in right shoulder: Secondary | ICD-10-CM | POA: Diagnosis not present

## 2014-12-26 DIAGNOSIS — G5691 Unspecified mononeuropathy of right upper limb: Secondary | ICD-10-CM | POA: Diagnosis not present

## 2014-12-27 ENCOUNTER — Ambulatory Visit: Payer: Medicare Other

## 2014-12-31 DIAGNOSIS — M25511 Pain in right shoulder: Secondary | ICD-10-CM | POA: Diagnosis not present

## 2014-12-31 DIAGNOSIS — M6281 Muscle weakness (generalized): Secondary | ICD-10-CM | POA: Diagnosis not present

## 2014-12-31 DIAGNOSIS — G5690 Unspecified mononeuropathy of unspecified upper limb: Secondary | ICD-10-CM | POA: Diagnosis not present

## 2015-01-03 DIAGNOSIS — M6281 Muscle weakness (generalized): Secondary | ICD-10-CM | POA: Diagnosis not present

## 2015-01-03 DIAGNOSIS — M25511 Pain in right shoulder: Secondary | ICD-10-CM | POA: Diagnosis not present

## 2015-01-03 DIAGNOSIS — G5690 Unspecified mononeuropathy of unspecified upper limb: Secondary | ICD-10-CM | POA: Diagnosis not present

## 2015-01-15 DIAGNOSIS — G5690 Unspecified mononeuropathy of unspecified upper limb: Secondary | ICD-10-CM | POA: Diagnosis not present

## 2015-01-15 DIAGNOSIS — M25511 Pain in right shoulder: Secondary | ICD-10-CM | POA: Diagnosis not present

## 2015-01-15 DIAGNOSIS — M6281 Muscle weakness (generalized): Secondary | ICD-10-CM | POA: Diagnosis not present

## 2015-01-17 ENCOUNTER — Encounter: Payer: Self-pay | Admitting: Internal Medicine

## 2015-01-18 ENCOUNTER — Ambulatory Visit: Payer: Medicare Other

## 2015-01-21 ENCOUNTER — Ambulatory Visit (INDEPENDENT_AMBULATORY_CARE_PROVIDER_SITE_OTHER): Payer: Medicare Other | Admitting: *Deleted

## 2015-01-21 DIAGNOSIS — Z23 Encounter for immunization: Secondary | ICD-10-CM | POA: Diagnosis not present

## 2015-01-22 DIAGNOSIS — G5690 Unspecified mononeuropathy of unspecified upper limb: Secondary | ICD-10-CM | POA: Diagnosis not present

## 2015-01-22 DIAGNOSIS — M6281 Muscle weakness (generalized): Secondary | ICD-10-CM | POA: Diagnosis not present

## 2015-01-22 DIAGNOSIS — M25511 Pain in right shoulder: Secondary | ICD-10-CM | POA: Diagnosis not present

## 2015-01-29 DIAGNOSIS — H40022 Open angle with borderline findings, high risk, left eye: Secondary | ICD-10-CM | POA: Diagnosis not present

## 2015-02-22 DIAGNOSIS — G5690 Unspecified mononeuropathy of unspecified upper limb: Secondary | ICD-10-CM | POA: Diagnosis not present

## 2015-02-27 ENCOUNTER — Ambulatory Visit (INDEPENDENT_AMBULATORY_CARE_PROVIDER_SITE_OTHER): Payer: Medicare Other | Admitting: Podiatry

## 2015-02-27 ENCOUNTER — Encounter: Payer: Self-pay | Admitting: Podiatry

## 2015-02-27 DIAGNOSIS — Q828 Other specified congenital malformations of skin: Secondary | ICD-10-CM

## 2015-02-27 DIAGNOSIS — M79673 Pain in unspecified foot: Secondary | ICD-10-CM | POA: Diagnosis not present

## 2015-02-27 DIAGNOSIS — M79609 Pain in unspecified limb: Principal | ICD-10-CM

## 2015-02-27 DIAGNOSIS — B351 Tinea unguium: Secondary | ICD-10-CM

## 2015-02-27 NOTE — Progress Notes (Signed)
Patient ID: Brianna Mack, female   DOB: 04-06-1945, 70 y.o.   MRN: [id number] Complaint:  Visit Type: Patient presents to my office for continued preventative foot care services. Complaint: Patient states" my nails have grown long and thick and become painful to walk and wear shoes" . The patient presents for preventative foot care services. She has developed painful callus under the ball of her right foot.  Podiatric Exam: Vascular: dorsalis pedis and posterior tibial pulses are palpable bilateral. Capillary return is immediate. Temperature gradient is WNL. Skin turgor WNL  Sensorium: Normal Semmes Weinstein monofilament test. Normal tactile sensation bilaterally. Nail Exam: Pt has thick disfigured discolored nails with subungual debris noted bilateral entire nail hallux through fifth toenails Ulcer Exam: There is no evidence of ulcer or pre-ulcerative changes or infection. Orthopedic Exam: Muscle tone and strength are WNL. No limitations in general ROM. No crepitus or effusions noted. Foot type and digits show no abnormalities. Bony prominences are unremarkable. Skin:  Porokeratosis sub3/4 right foot.. No infection or ulcers  Diagnosis:  Onychomycosis, , Pain in right toe, pain in left toes  Treatment & Plan Procedures and Treatment: Consent by patient was obtained for treatment procedures. The patient understood the discussion of treatment and procedures well. All questions were answered thoroughly reviewed. Debridement of mycotic and hypertrophic toenails, 1 through 5 bilateral and clearing of subungual debris. No ulceration, no infection noted. Debride porokeratosis. Return Visit-Office Procedure: Patient instructed to return to the office for a follow up visit 10 weeks for continued evaluation and treatment.  Gardiner Barefoot DPM

## 2015-03-05 DIAGNOSIS — L821 Other seborrheic keratosis: Secondary | ICD-10-CM | POA: Diagnosis not present

## 2015-05-01 ENCOUNTER — Telehealth: Payer: Self-pay | Admitting: Internal Medicine

## 2015-05-01 NOTE — Telephone Encounter (Signed)
Left message on voicemail to call office on home phone.

## 2015-05-01 NOTE — Telephone Encounter (Signed)
Pt left message on my voicemail that her husband is in the hospital.

## 2015-05-01 NOTE — Telephone Encounter (Signed)
PT would like donna to return her call concerning her husband. Pt husband will be having a liver biopsy

## 2015-05-08 ENCOUNTER — Ambulatory Visit (INDEPENDENT_AMBULATORY_CARE_PROVIDER_SITE_OTHER): Payer: Medicare Other | Admitting: Podiatry

## 2015-05-08 ENCOUNTER — Encounter: Payer: Self-pay | Admitting: Podiatry

## 2015-05-08 DIAGNOSIS — M79673 Pain in unspecified foot: Secondary | ICD-10-CM | POA: Diagnosis not present

## 2015-05-08 DIAGNOSIS — B351 Tinea unguium: Secondary | ICD-10-CM | POA: Diagnosis not present

## 2015-05-08 DIAGNOSIS — M79609 Pain in unspecified limb: Principal | ICD-10-CM

## 2015-05-08 DIAGNOSIS — Q828 Other specified congenital malformations of skin: Secondary | ICD-10-CM

## 2015-05-08 NOTE — Progress Notes (Signed)
Patient ID: Brianna Mack, female   DOB: 1945-02-26, 70 y.o.   MRN: [id number] Complaint:  Visit Type: Patient presents to my office for continued preventative foot care services. Complaint: Patient states" my nails have grown long and thick and become painful to walk and wear shoes" . The patient presents for preventative foot care services. She has developed painful callus under the ball of her right foot.  Podiatric Exam: Vascular: dorsalis pedis and posterior tibial pulses are palpable bilateral. Capillary return is immediate. Temperature gradient is WNL. Skin turgor WNL  Sensorium: Normal Semmes Weinstein monofilament test. Normal tactile sensation bilaterally. Nail Exam: Pt has thick disfigured discolored nails with subungual debris noted bilateral entire nail hallux through fifth toenails Ulcer Exam: There is no evidence of ulcer or pre-ulcerative changes or infection. Orthopedic Exam: Muscle tone and strength are WNL. No limitations in general ROM. No crepitus or effusions noted. Foot type and digits show no abnormalities. Bony prominences are unremarkable. Skin:  Porokeratosis sub3/4 right foot.. No infection or ulcers  Diagnosis:  Onychomycosis, , Pain in right toe, pain in left toes, Porokeratosis right foot  Treatment & Plan Procedures and Treatment: Consent by patient was obtained for treatment procedures. The patient understood the discussion of treatment and procedures well. All questions were answered thoroughly reviewed. Debridement of mycotic and hypertrophic toenails, 1 through 5 bilateral and clearing of subungual debris. No ulceration, no infection noted. Debride porokeratosis. Return Visit-Office Procedure: Patient instructed to return to the office for a follow up visit 10 weeks for continued evaluation and treatment.  Gardiner Barefoot DPM

## 2015-05-10 ENCOUNTER — Telehealth: Payer: Self-pay | Admitting: *Deleted

## 2015-05-10 ENCOUNTER — Telehealth: Payer: Self-pay | Admitting: Internal Medicine

## 2015-05-10 NOTE — Telephone Encounter (Signed)
Pt called back , she said she did not call back again since I spoke to her. She said she called to see if there were any appointments avaiable and they put her to Triage. Told her okay just wanted to make sure you did not need anything else. Pt verbalized understanding. Told pt if she gets worse call for an appt next week. PT verbalized understanding.

## 2015-05-10 NOTE — Telephone Encounter (Signed)
Patient Name: Brianna Mack  DOB: 1945/06/06    Initial Comment Caller states has a bad cough, so strong it made her vomit. Husband has stage 4 cancer, asking if she is contagious, no fever   Nurse Assessment  Nurse: Raphael Gibney, RN, Vanita Ingles Date/Time (Eastern Time): 05/10/2015 9:15:53 AM  Confirm and document reason for call. If symptomatic, describe symptoms. You must click the next button to save text entered. ---Caller states she has a cough. She is coughing so hard she has vomited. Her spouse was recently diagnosed with stage 4 cancer. He is in a rehab facilty. No fever. Has dry cough.  Has the patient traveled out of the country within the last 30 days? ---No  Does the patient have any new or worsening symptoms? ---Yes  Will a triage be completed? ---Yes  Related visit to physician within the last 2 weeks? ---No  Does the PT have any chronic conditions? (i.e. diabetes, asthma, etc.) ---No  Is this a behavioral health or substance abuse call? ---No     Guidelines    Guideline Title Affirmed Question Affirmed Notes  Cough - Acute Non-Productive SEVERE coughing spells (e.g., whooping sound after coughing, vomiting after coughing)    Final Disposition User   See Physician within 24 Hours Stringer, RN, Vanita Ingles    Comments  pt states she has to go see her spouse in the rehab facility as they have a lot of decisions to make.  Pt can not make appt for today.   Referrals  GO TO FACILITY REFUSED   Disagree/Comply: Disagree  Disagree/Comply Reason: Disagree with instructions

## 2015-05-10 NOTE — Telephone Encounter (Signed)
Left message on voicemail to call office.  

## 2015-05-10 NOTE — Telephone Encounter (Signed)
Already spoke to pt, regarding cough. See other message.

## 2015-05-10 NOTE — Telephone Encounter (Signed)
Called pt back regarding message on my voicemail. Pt said she has a dry cough, no fever and is concerned about being around husband who is in [place]. Asked pt if running a fever? Pt said no. Told her she can try OTC Delsym cough syrup and Mucinex DM to help with congestion. Also can wear a mask around husband if concerned about getting him sick. Pt verbalized understanding. Told pt if develops fever or cough gets worse to call back. Pt verbalized understanding.

## 2015-05-12 DIAGNOSIS — R6889 Other general symptoms and signs: Secondary | ICD-10-CM | POA: Diagnosis not present

## 2015-05-22 DIAGNOSIS — M25511 Pain in right shoulder: Secondary | ICD-10-CM | POA: Diagnosis not present

## 2015-06-14 DIAGNOSIS — L814 Other melanin hyperpigmentation: Secondary | ICD-10-CM | POA: Diagnosis not present

## 2015-06-14 DIAGNOSIS — Z79899 Other long term (current) drug therapy: Secondary | ICD-10-CM | POA: Diagnosis not present

## 2015-06-14 DIAGNOSIS — L821 Other seborrheic keratosis: Secondary | ICD-10-CM | POA: Diagnosis not present

## 2015-06-14 DIAGNOSIS — D485 Neoplasm of uncertain behavior of skin: Secondary | ICD-10-CM | POA: Diagnosis not present

## 2015-06-14 DIAGNOSIS — L850 Acquired ichthyosis: Secondary | ICD-10-CM | POA: Diagnosis not present

## 2015-06-14 DIAGNOSIS — L218 Other seborrheic dermatitis: Secondary | ICD-10-CM | POA: Diagnosis not present

## 2015-06-14 DIAGNOSIS — M340 Progressive systemic sclerosis: Secondary | ICD-10-CM | POA: Diagnosis not present

## 2015-06-14 DIAGNOSIS — L570 Actinic keratosis: Secondary | ICD-10-CM | POA: Diagnosis not present

## 2015-07-10 ENCOUNTER — Ambulatory Visit (INDEPENDENT_AMBULATORY_CARE_PROVIDER_SITE_OTHER): Payer: Medicare Other | Admitting: Podiatry

## 2015-07-10 ENCOUNTER — Encounter: Payer: Self-pay | Admitting: Podiatry

## 2015-07-10 DIAGNOSIS — Q828 Other specified congenital malformations of skin: Secondary | ICD-10-CM | POA: Diagnosis not present

## 2015-07-10 NOTE — Progress Notes (Signed)
Patient ID: Brianna Mack, female   DOB: 02-02-1946, 70 y.o.   MRN: [id number] Complaint:  Visit Type: Patient presents to my office for continued preventative foot care services. Complaint: Patient says she has return of callus on her right forefoot.  She says she has a busy weekend planned so she presents for callus only.  This area is painful walking and wearing her shoes.  She presents for preventive footcare services.  Podiatric Exam: Vascular: dorsalis pedis and posterior tibial pulses are palpable bilateral. Capillary return is immediate. Temperature gradient is WNL. Skin turgor WNL  Sensorium: Normal Semmes Weinstein monofilament test. Normal tactile sensation bilaterally. Nail Exam: Pt has thick disfigured discolored nails with subungual debris noted bilateral entire nail hallux through fifth toenails Ulcer Exam: There is no evidence of ulcer or pre-ulcerative changes or infection. Orthopedic Exam: Muscle tone and strength are WNL. No limitations in general ROM. No crepitus or effusions noted. Foot type and digits show no abnormalities. Bony prominences are unremarkable. Skin:  Porokeratosis sub3/4 right foot.. No infection or ulcers  Diagnosis:  Porokeratosis right foot  Treatment & Plan Procedures and Treatment: Consent by patient was obtained for treatment procedures. The patient understood the discussion of treatment and procedures well. All questions were answered thoroughly reviewed. . Debride porokeratosis.Discussed alternative treatment for this condition. Return Visit-Office Procedure: Patient instructed to return to the office for a follow up visit  As scheduled. for continued evaluation and treatment.  Gardiner Barefoot DPM

## 2015-07-11 ENCOUNTER — Ambulatory Visit: Payer: Medicare Other | Admitting: Podiatry

## 2015-07-17 ENCOUNTER — Ambulatory Visit: Payer: Medicare Other | Admitting: Podiatry

## 2015-07-23 DIAGNOSIS — J069 Acute upper respiratory infection, unspecified: Secondary | ICD-10-CM | POA: Diagnosis not present

## 2015-07-24 ENCOUNTER — Ambulatory Visit: Payer: Medicare Other | Admitting: Podiatry

## 2015-07-26 ENCOUNTER — Ambulatory Visit (INDEPENDENT_AMBULATORY_CARE_PROVIDER_SITE_OTHER): Payer: Medicare Other | Admitting: Internal Medicine

## 2015-07-26 ENCOUNTER — Encounter: Payer: Self-pay | Admitting: Internal Medicine

## 2015-07-26 VITALS — BP 120/80 | HR 73 | Temp 98.1°F | Ht 62.0 in | Wt 171.0 lb

## 2015-07-26 DIAGNOSIS — B9789 Other viral agents as the cause of diseases classified elsewhere: Principal | ICD-10-CM

## 2015-07-26 DIAGNOSIS — J069 Acute upper respiratory infection, unspecified: Secondary | ICD-10-CM

## 2015-07-26 MED ORDER — HYDROCODONE-ACETAMINOPHEN 5-325 MG PO TABS: 1.0000 | ORAL_TABLET | Freq: Four times a day (QID) | ORAL | Status: DC | PRN

## 2015-07-26 MED ORDER — BENZONATATE 200 MG PO CAPS: 200.0000 mg | ORAL_CAPSULE | Freq: Two times a day (BID) | ORAL | Status: DC | PRN

## 2015-07-26 NOTE — Progress Notes (Signed)
Subjective:    Patient ID: Brianna Mack, female    DOB: [birth year], 70 y.o.   MRN: [id number]  HPI  70 year old patient who presents with a eight-day history of sinus congestion, cough.  She has had some left ear discomfort.  Cough is her main symptom.  She was seen at the urgent care 3 days ago and is completing azithromycin.  She has also been using Mucinex DM She has been under considerable situational stress due to the terminal illness of her husband    Patient has allergies to codeine and morphine, but has done well with hydrocodone  Past Medical History  Diagnosis Date  . Depression   . Anxiety   . IBS (irritable bowel syndrome)   . Arthritis      Social History   Social History  . Marital Status: Married    Spouse Name: N/A  . Number of Children: N/A  . Years of Education: N/A   Occupational History  . Not on file.   Social History Main Topics  . Smoking status: Former Smoker    Quit date: 08/03/1977  . Smokeless tobacco: Never Used  . Alcohol Use: 0.0 oz/week    0 Standard drinks or equivalent per week     Comment: occasionally  . Drug Use: No  . Sexual Activity: Not Currently   Other Topics Concern  . Not on file   Social History Narrative    Past Surgical History  Procedure Laterality Date  . Cholecystectomy  1976  . Tonsillectomy  1954  . Dilation and curettage of uterus    . Hand surgery  04-02-2011    right  . Oophorectomy      BSO  . Abdominal hysterectomy  2001    BSO ; SLING PROCEDURE endometriosis/leiomyomata  . Orif shoulder fracture Right 06/28/2014    Procedure: RIGHT SHOULDER HEMI-ARTHROPLASTY;  Surgeon: Netta Cedars, MD;  Location: [facility];  Service: Orthopedics;  Laterality: Right;    Family History  Problem Relation Age of Onset  . Diabetes Mother   . Hypertension Mother   . Cancer Mother     Laurell Roof  . COPD Father     Allergies  Allergen Reactions  . Augmentin [Amoxicillin-Pot Clavulanate] Itching and Swelling   Headache  . Cephalexin Diarrhea  . Codeine Sulfate     REACTION: nausea, vomiting  . Fluconazole     REACTION: swelling and itching  . Morphine     REACTION: nausea, itching  . Sulfonamide Derivatives     REACTION: rash    Current Outpatient Prescriptions on File Prior to Visit  Medication Sig Dispense Refill  . Acetaminophen (TYLENOL ARTHRITIS PAIN PO) Take 1,000 mg by mouth 2 (two) times daily as needed (back pain).     Marland Kitchen ALPRAZolam (XANAX) 0.25 MG tablet Take 1 tablet (0.25 mg total) by mouth at bedtime as needed for anxiety. 30 tablet 4  . Calcium Carbonate-Vitamin D (CALCIUM + D PO) Take 1 tablet by mouth daily.     . citalopram (CELEXA) 20 MG tablet Take 1 tablet (20 mg total) by mouth daily. 30 tablet 12  . estradiol (ESTRACE) 0.5 MG tablet Take 1 tablet (0.5 mg total) by mouth daily. 30 tablet 12  . hyoscyamine (LEVBID) 0.375 MG 12 hr tablet Take 0.375 mg by mouth daily.     . Lactobacillus (ACIDOPHILUS PO) Take by mouth daily.    . methocarbamol (ROBAXIN) 500 MG tablet Take 1 tablet (500 mg total) by mouth every 6 (  six) hours as needed for muscle spasms. 60 tablet 0   No current facility-administered medications on file prior to visit.    BP 120/80 mmHg  Pulse 73  Temp(Src) 98.1 F (36.7 C) (Oral)  Ht 5\' 2"  (1.575 m)  Wt 171 lb (77.565 kg)  BMI 31.27 kg/m2  SpO2 97%     Review of Systems  Constitutional: Positive for fever, activity change, appetite change and fatigue.  HENT: Positive for congestion, ear pain and postnasal drip. Negative for dental problem, hearing loss, rhinorrhea, sinus pressure, sore throat and tinnitus.   Eyes: Negative for pain, discharge and visual disturbance.  Respiratory: Positive for cough. Negative for shortness of breath.   Cardiovascular: Negative for chest pain, palpitations and leg swelling.  Gastrointestinal: Negative for nausea, vomiting, abdominal pain, diarrhea, constipation, blood in stool and abdominal distention.    Genitourinary: Negative for dysuria, urgency, frequency, hematuria, flank pain, vaginal bleeding, vaginal discharge, difficulty urinating, vaginal pain and pelvic pain.  Musculoskeletal: Negative for joint swelling, arthralgias and gait problem.  Skin: Negative for rash.  Neurological: Negative for dizziness, syncope, speech difficulty, weakness, numbness and headaches.  Hematological: Negative for adenopathy.  Psychiatric/Behavioral: Negative for behavioral problems, dysphoric mood and agitation. The patient is not nervous/anxious.        Objective:   Physical Exam  Constitutional: She is oriented to person, place, and time. She appears well-developed and well-nourished.  Appears fatigued but in no acute distress.  Afebrile, normal blood pressure    HENT:  Head: Normocephalic.  Right Ear: External ear normal.  Left Ear: External ear normal.  Mouth/Throat: Oropharynx is clear and moist.  No sinus tenderness  Left canal occluded  Eyes: Conjunctivae and EOM are normal. Pupils are equal, round, and reactive to light.  Neck: Normal range of motion. Neck supple. No thyromegaly present.  Cardiovascular: Normal rate, regular rhythm, normal heart sounds and intact distal pulses.   Pulmonary/Chest: Effort normal and breath sounds normal. No respiratory distress. She has no wheezes. She has no rales.  Abdominal: Soft. Bowel sounds are normal. She exhibits no mass. There is no tenderness.  Musculoskeletal: Normal range of motion.  Lymphadenopathy:    She has no cervical adenopathy.  Neurological: She is alert and oriented to person, place, and time.  Skin: Skin is warm and dry. No rash noted.  Psychiatric: She has a normal mood and affect. Her behavior is normal.          Assessment & Plan:   .  Viral URI with cough .  Left cerumen impaction  .  Left canal irrigated until clear .  Will treat symptomatically   Nyoka Cowden, MD

## 2015-07-26 NOTE — Progress Notes (Signed)
Pre visit review using our clinic review tool, if applicable. No additional management support is needed unless otherwise documented below in the visit note. 

## 2015-07-26 NOTE — Patient Instructions (Signed)
Take over-the-counter expectorants and cough medications such as  Mucinex DM.  Call if there is no improvement in 5 to 7 days or if  you develop worsening cough, fever, or new symptoms, such as shortness of breath or chest pain.  HOME CARE INSTRUCTIONS  Drink plenty of water. Water helps thin the mucus so your sinuses can drain more easily.  Use a humidifier.  Inhale steam 3-4 times a day (for example, sit in the bathroom with the shower running).  Apply a warm, moist washcloth to your face 3-4 times a day, or as directed by your health care provider.  Use saline nasal sprays to help moisten and clean your sinuses.  Take medicines only as directed by your health care provider.  If you were prescribed either an antibiotic or antifungal medicine, finish it all even if you start to feel better.    Cerumen Impaction The structures of the external ear canal secrete a waxy substance known as cerumen. Excess cerumen can build up in the ear canal, causing a condition known as cerumen impaction. Cerumen impaction can cause ear pain and disrupt the function of the ear. The rate of cerumen production differs for each individual. In certain individuals, the configuration of the ear canal may decrease his or her ability to naturally remove cerumen. CAUSES Cerumen impaction is caused by excessive cerumen production or buildup. RISK FACTORS  Frequent use of swabs to clean ears.  Having narrow ear canals.  Having eczema.  Being dehydrated. SIGNS AND SYMPTOMS  Diminished hearing.  Ear drainage.  Ear pain.  Ear itch. TREATMENT Treatment may involve:  Over-the-counter or prescription ear drops to soften the cerumen.  Removal of cerumen by a health care provider. This may be done with:  Irrigation with warm water. This is the most common method of removal.  Ear curettes and other instruments.  Surgery. This may be done in severe cases. HOME CARE INSTRUCTIONS  Take medicines only as  directed by your health care provider.  Do not insert objects into the ear with the intent of cleaning the ear. PREVENTION  Do not insert objects into the ear, even with the intent of cleaning the ear. Removing cerumen as a part of normal hygiene is not necessary, and the use of swabs in the ear canal is not recommended.  Drink enough water to keep your urine clear or pale yellow.  Control your eczema if you have it. SEEK MEDICAL CARE IF:  You develop ear pain.  You develop bleeding from the ear.  The cerumen does not clear after you use ear drops as directed.   This information is not intended to replace advice given to you by your health care provider. Make sure you discuss any questions you have with your health care provider.   Document Released: 03/12/2004 Document Revised: 02/23/2014 Document Reviewed: 09/19/2014 Elsevier Interactive Patient Education [employer].

## 2015-07-31 ENCOUNTER — Ambulatory Visit (INDEPENDENT_AMBULATORY_CARE_PROVIDER_SITE_OTHER): Payer: Medicare Other | Admitting: Podiatry

## 2015-07-31 ENCOUNTER — Telehealth: Payer: Self-pay | Admitting: Internal Medicine

## 2015-07-31 ENCOUNTER — Encounter: Payer: Self-pay | Admitting: Podiatry

## 2015-07-31 DIAGNOSIS — M79676 Pain in unspecified toe(s): Secondary | ICD-10-CM

## 2015-07-31 DIAGNOSIS — B351 Tinea unguium: Secondary | ICD-10-CM

## 2015-07-31 DIAGNOSIS — M79609 Pain in unspecified limb: Principal | ICD-10-CM

## 2015-07-31 MED ORDER — NEOMYCIN-POLYMYXIN-HC 3.5-10000-1 OT SOLN: 3.0000 [drp] | Freq: Four times a day (QID) | OTIC | Status: DC

## 2015-07-31 NOTE — Progress Notes (Signed)
Patient ID: Brianna Mack, female   DOB: 1945/07/04, 70 y.o.   MRN: [id number] Complaint:  Visit Type: Patient presents to my office for continued preventative foot care services. Complaint: Patient states" my nails have grown long and thick and become painful to walk and wear shoes" . The patient presents for preventative foot care services. She has developed painful callus under the ball of her right foot.  Podiatric Exam: Vascular: dorsalis pedis and posterior tibial pulses are palpable bilateral. Capillary return is immediate. Temperature gradient is WNL. Skin turgor WNL  Sensorium: Normal Semmes Weinstein monofilament test. Normal tactile sensation bilaterally. Nail Exam: Pt has thick disfigured discolored nails with subungual debris noted bilateral entire nail hallux through fifth toenails Ulcer Exam: There is no evidence of ulcer or pre-ulcerative changes or infection. Orthopedic Exam: Muscle tone and strength are WNL. No limitations in general ROM. No crepitus or effusions noted. Foot type and digits show no abnormalities. Bony prominences are unremarkable. Skin:  Porokeratosis sub3/4 right foot.. No infection or ulcers  Diagnosis:  Onychomycosis, , Pain in right toe, pain in left toes  Treatment & Plan Procedures and Treatment: Consent by patient was obtained for treatment procedures. The patient understood the discussion of treatment and procedures well. All questions were answered thoroughly reviewed. Debridement of mycotic and hypertrophic toenails, 1 through 5 bilateral and clearing of subungual debris. No ulceration, no infection noted.  Return Visit-Office Procedure: Patient instructed to return to the office for a follow up visit 10 weeks for continued evaluation and treatment.  Gardiner Barefoot DPM

## 2015-07-31 NOTE — Progress Notes (Deleted)
Patient ID: Brianna Mack, female   DOB: 05/04/1945, 70 y.o.   MRN: [id number] Complaint:  Visit Type: Patient presents to my office for continued preventative foot care services. Complaint: Patient says she has return of callus on her right forefoot.  She says she has a busy weekend planned so she presents for callus only.  This area is painful walking and wearing her shoes.  She presents for preventive footcare services.  Podiatric Exam: Vascular: dorsalis pedis and posterior tibial pulses are palpable bilateral. Capillary return is immediate. Temperature gradient is WNL. Skin turgor WNL  Sensorium: Normal Semmes Weinstein monofilament test. Normal tactile sensation bilaterally. Nail Exam: Pt has thick disfigured discolored nails with subungual debris noted bilateral entire nail hallux through fifth toenails Ulcer Exam: There is no evidence of ulcer or pre-ulcerative changes or infection. Orthopedic Exam: Muscle tone and strength are WNL. No limitations in general ROM. No crepitus or effusions noted. Foot type and digits show no abnormalities. Bony prominences are unremarkable. Skin:  Porokeratosis sub3/4 right foot.. No infection or ulcers  Diagnosis:  Porokeratosis right foot  Treatment & Plan Procedures and Treatment: Consent by patient was obtained for treatment procedures. The patient understood the discussion of treatment and procedures well. All questions were answered thoroughly reviewed. . Debride porokeratosis.Discussed alternative treatment for this condition. Return Visit-Office Procedure: Patient instructed to return to the office for a follow up visit  As scheduled. for continued evaluation and treatment.  Gardiner Barefoot DPM

## 2015-07-31 NOTE — Telephone Encounter (Signed)
Please see message and advise 

## 2015-07-31 NOTE — Telephone Encounter (Signed)
Pt was seen last Friday and is still having symptoms. Pt states her ear got better, but not when  she bends over or stands up it hurts.  Pt will take tylenol and it will get better. But it hurts when she swallows.  pt still has a cough. Pt does not want to see any other provider. [place]

## 2015-07-31 NOTE — Telephone Encounter (Signed)
Please call in a prescription for Cortisporin otic solution.  3 drops to the left ear 4 times daily.  Office appointment Friday.  If unimproved

## 2015-07-31 NOTE — Telephone Encounter (Signed)
Spoke to pt, told her Rx was sent to pharmacy for ear drops. If not improved by Friday Dr.K wants to see you. Pt verbalized understanding.

## 2015-08-09 ENCOUNTER — Encounter: Payer: Self-pay | Admitting: Internal Medicine

## 2015-08-09 ENCOUNTER — Ambulatory Visit (INDEPENDENT_AMBULATORY_CARE_PROVIDER_SITE_OTHER): Payer: Medicare Other | Admitting: Internal Medicine

## 2015-08-09 VITALS — BP 118/70 | HR 69 | Temp 97.9°F | Resp 20 | Ht 62.0 in | Wt 170.0 lb

## 2015-08-09 DIAGNOSIS — G5712 Meralgia paresthetica, left lower limb: Secondary | ICD-10-CM | POA: Diagnosis not present

## 2015-08-09 NOTE — Progress Notes (Signed)
Subjective:    Patient ID: Brianna Mack, female    DOB: [birth year], 70 y.o.   MRN: [id number]  HPI  70 year old patient who is seen today following a recent URI.  She has some residual cough, but in general doing much better. For the past 3 or 4 months she is having a burning sensation involving a patchy area involving the left lateral thigh area.  Symptoms are intermittent but present constantly, at least low-grade. Otherwise, doing reasonably well, although under considerable stress due to the terminal health of her husband  Past Medical History  Diagnosis Date  . Depression   . Anxiety   . IBS (irritable bowel syndrome)   . Arthritis      Social History   Social History  . Marital Status: Married    Spouse Name: N/A  . Number of Children: N/A  . Years of Education: N/A   Occupational History  . Not on file.   Social History Main Topics  . Smoking status: Former Smoker    Quit date: 08/03/1977  . Smokeless tobacco: Never Used  . Alcohol Use: 0.0 oz/week    0 Standard drinks or equivalent per week     Comment: occasionally  . Drug Use: No  . Sexual Activity: Not Currently   Other Topics Concern  . Not on file   Social History Narrative    Past Surgical History  Procedure Laterality Date  . Cholecystectomy  1976  . Tonsillectomy  1954  . Dilation and curettage of uterus    . Hand surgery  04-02-2011    right  . Oophorectomy      BSO  . Abdominal hysterectomy  2001    BSO ; SLING PROCEDURE endometriosis/leiomyomata  . Orif shoulder fracture Right 06/28/2014    Procedure: RIGHT SHOULDER HEMI-ARTHROPLASTY;  Surgeon: Netta Cedars, MD;  Location: [facility];  Service: Orthopedics;  Laterality: Right;    Family History  Problem Relation Age of Onset  . Diabetes Mother   . Hypertension Mother   . Cancer Mother     Laurell Roof  . COPD Father     Allergies  Allergen Reactions  . Augmentin [Amoxicillin-Pot Clavulanate] Itching and Swelling    Headache  .  Cephalexin Diarrhea  . Codeine Sulfate     REACTION: nausea, vomiting  . Fluconazole     REACTION: swelling and itching  . Morphine     REACTION: nausea, itching  . Sulfonamide Derivatives     REACTION: rash    Current Outpatient Prescriptions on File Prior to Visit  Medication Sig Dispense Refill  . Acetaminophen (TYLENOL ARTHRITIS PAIN PO) Take 1,000 mg by mouth 2 (two) times daily as needed (back pain).     Marland Kitchen ALPRAZolam (XANAX) 0.25 MG tablet Take 1 tablet (0.25 mg total) by mouth at bedtime as needed for anxiety. 30 tablet 4  . Calcium Carbonate-Vitamin D (CALCIUM + D PO) Take 1 tablet by mouth daily.     . citalopram (CELEXA) 20 MG tablet Take 1 tablet (20 mg total) by mouth daily. 30 tablet 12  . estradiol (ESTRACE) 0.5 MG tablet Take 1 tablet (0.5 mg total) by mouth daily. 30 tablet 12  . HYDROcodone-acetaminophen (NORCO/VICODIN) 5-325 MG tablet Take 1 tablet by mouth every 6 (six) hours as needed for moderate pain. 30 tablet 0  . hyoscyamine (LEVBID) 0.375 MG 12 hr tablet Take 0.375 mg by mouth daily.     . Lactobacillus (ACIDOPHILUS PO) Take by mouth daily.    Marland Kitchen  methocarbamol (ROBAXIN) 500 MG tablet Take 1 tablet (500 mg total) by mouth every 6 (six) hours as needed for muscle spasms. 60 tablet 0  . neomycin-polymyxin-hydrocortisone (CORTISPORIN) otic solution Place 3 drops into the left ear 4 (four) times daily. 10 mL 0   No current facility-administered medications on file prior to visit.    BP 118/70 mmHg  Pulse 69  Temp(Src) 97.9 F (36.6 C) (Oral)  Resp 20  Ht 5\' 2"  (1.575 m)  Wt 170 lb (77.111 kg)  BMI 31.09 kg/m2  SpO2 98%     Review of Systems  Constitutional: Negative.   HENT: Negative for congestion, dental problem, hearing loss, rhinorrhea, sinus pressure, sore throat and tinnitus.   Eyes: Negative for pain, discharge and visual disturbance.  Respiratory: Positive for cough. Negative for shortness of breath.   Cardiovascular: Negative for chest pain,  palpitations and leg swelling.  Gastrointestinal: Negative for nausea, vomiting, abdominal pain, diarrhea, constipation, blood in stool and abdominal distention.  Genitourinary: Negative for dysuria, urgency, frequency, hematuria, flank pain, vaginal bleeding, vaginal discharge, difficulty urinating, vaginal pain and pelvic pain.  Musculoskeletal: Negative for joint swelling, arthralgias and gait problem.  Skin: Negative for rash.  Neurological: Positive for dizziness and numbness. Negative for syncope, speech difficulty, weakness and headaches.  Hematological: Negative for adenopathy.  Psychiatric/Behavioral: Negative for behavioral problems, dysphoric mood and agitation. The patient is not nervous/anxious.        Objective:   Physical Exam  Constitutional: She is oriented to person, place, and time. She appears well-developed and well-nourished.  HENT:  Head: Normocephalic.  Right Ear: External ear normal.  Left Ear: External ear normal.  Mouth/Throat: Oropharynx is clear and moist.  Eyes: Conjunctivae and EOM are normal. Pupils are equal, round, and reactive to light.  Neck: Normal range of motion. Neck supple. No thyromegaly present.  Cardiovascular: Normal rate, regular rhythm, normal heart sounds and intact distal pulses.   Pulmonary/Chest: Effort normal and breath sounds normal. No respiratory distress. She has no wheezes. She has no rales.  Abdominal: Soft. Bowel sounds are normal. She exhibits no mass. There is no tenderness.  Musculoskeletal: Normal range of motion.  Lymphadenopathy:    She has no cervical adenopathy.  Neurological: She is alert and oriented to person, place, and time.  Skin: Skin is warm and dry. No rash noted.  Psychiatric: She has a normal mood and affect. Her behavior is normal.          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  Patient reassured.  She will call for any clinical worsening  Annual CPX as  scheduled  Nyoka Cowden, MD

## 2015-08-09 NOTE — Progress Notes (Signed)
Pre visit review using our clinic review tool, if applicable. No additional management support is needed unless otherwise documented below in the visit note. 

## 2015-09-04 DIAGNOSIS — H04123 Dry eye syndrome of bilateral lacrimal glands: Secondary | ICD-10-CM | POA: Diagnosis not present

## 2015-09-04 DIAGNOSIS — H40022 Open angle with borderline findings, high risk, left eye: Secondary | ICD-10-CM | POA: Diagnosis not present

## 2015-09-04 DIAGNOSIS — H2513 Age-related nuclear cataract, bilateral: Secondary | ICD-10-CM | POA: Diagnosis not present

## 2015-09-04 DIAGNOSIS — H52203 Unspecified astigmatism, bilateral: Secondary | ICD-10-CM | POA: Diagnosis not present

## 2015-09-04 DIAGNOSIS — H524 Presbyopia: Secondary | ICD-10-CM | POA: Diagnosis not present

## 2015-09-19 ENCOUNTER — Telehealth: Payer: Self-pay | Admitting: Internal Medicine

## 2015-09-19 DIAGNOSIS — S4290XS Fracture of unspecified shoulder girdle, part unspecified, sequela: Secondary | ICD-10-CM

## 2015-09-19 NOTE — Telephone Encounter (Signed)
° °  Pt call to ask for a referral for Physical Therapy for her shoulder .   Physical therapist ; Melonie [place]

## 2015-09-19 NOTE — Telephone Encounter (Signed)
Referral okay? 

## 2015-09-20 NOTE — Telephone Encounter (Signed)
ok 

## 2015-09-20 NOTE — Telephone Encounter (Signed)
Referral placed.

## 2015-10-02 ENCOUNTER — Ambulatory Visit (INDEPENDENT_AMBULATORY_CARE_PROVIDER_SITE_OTHER): Payer: Medicare Other | Admitting: Podiatry

## 2015-10-02 DIAGNOSIS — B351 Tinea unguium: Secondary | ICD-10-CM

## 2015-10-02 DIAGNOSIS — M79673 Pain in unspecified foot: Secondary | ICD-10-CM

## 2015-10-02 DIAGNOSIS — Q828 Other specified congenital malformations of skin: Secondary | ICD-10-CM | POA: Diagnosis not present

## 2015-10-02 DIAGNOSIS — M79609 Pain in unspecified limb: Principal | ICD-10-CM

## 2015-10-02 NOTE — Progress Notes (Signed)
Patient ID: Brianna Mack, female   DOB: 06-14-1945, 70 y.o.   MRN: [id number] Complaint:  Visit Type: Patient presents to my office for continued preventative foot care services. Complaint: Patient states" my nails have grown long and thick and become painful to walk and wear shoes" . The patient presents for preventative foot care services. She has developed painful callus under the ball of her right foot.  Podiatric Exam: Vascular: dorsalis pedis and posterior tibial pulses are palpable bilateral. Capillary return is immediate. Temperature gradient is WNL. Skin turgor WNL  Sensorium: Normal Semmes Weinstein monofilament test. Normal tactile sensation bilaterally. Nail Exam: Pt has thick disfigured discolored nails with subungual debris noted bilateral entire nail hallux through fifth toenails Ulcer Exam: There is no evidence of ulcer or pre-ulcerative changes or infection. Orthopedic Exam: Muscle tone and strength are WNL. No limitations in general ROM. No crepitus or effusions noted. Foot type and digits show no abnormalities. Bony prominences are unremarkable. Skin:  Porokeratosis sub3/4 right foot.. No infection or ulcers  Diagnosis:  Onychomycosis, , Pain in right toe, pain in left toes  Treatment & Plan Procedures and Treatment: Consent by patient was obtained for treatment procedures. The patient understood the discussion of treatment and procedures well. All questions were answered thoroughly reviewed. Debridement of mycotic and hypertrophic toenails, 1 through 5 bilateral and clearing of subungual debris. No ulceration, no infection noted. Debridement of porokeratosis Return Visit-Office Procedure: Patient instructed to return to the office for a follow up visit 10 weeks for continued evaluation and treatment.  Gardiner Barefoot DPM

## 2015-10-03 DIAGNOSIS — M25611 Stiffness of right shoulder, not elsewhere classified: Secondary | ICD-10-CM | POA: Diagnosis not present

## 2015-10-03 DIAGNOSIS — S4292XD Fracture of left shoulder girdle, part unspecified, subsequent encounter for fracture with routine healing: Secondary | ICD-10-CM | POA: Diagnosis not present

## 2015-10-03 DIAGNOSIS — R531 Weakness: Secondary | ICD-10-CM | POA: Diagnosis not present

## 2015-10-04 ENCOUNTER — Encounter: Payer: Self-pay | Admitting: Adult Health

## 2015-10-04 ENCOUNTER — Ambulatory Visit (INDEPENDENT_AMBULATORY_CARE_PROVIDER_SITE_OTHER): Payer: Medicare Other | Admitting: Adult Health

## 2015-10-04 VITALS — BP 132/70 | Temp 98.6°F | Ht 62.0 in | Wt 172.3 lb

## 2015-10-04 DIAGNOSIS — J0140 Acute pansinusitis, unspecified: Secondary | ICD-10-CM | POA: Diagnosis not present

## 2015-10-04 MED ORDER — HYDROCODONE-HOMATROPINE 5-1.5 MG/5ML PO SYRP: 5.0000 mL | ORAL_SOLUTION | Freq: Three times a day (TID) | ORAL | 0 refills | Status: DC | PRN

## 2015-10-04 MED ORDER — DOXYCYCLINE HYCLATE 100 MG PO CAPS: 100.0000 mg | ORAL_CAPSULE | Freq: Two times a day (BID) | ORAL | 0 refills | Status: DC

## 2015-10-04 NOTE — Progress Notes (Signed)
Subjective:    Patient ID: Brianna Mack, female    DOB: 1946/02/02, 70 y.o.   MRN: [id number]  URI   The current episode started in the past 7 days (waxing and waning over the course of 2 months but this week it has been worse). The problem has been waxing and waning. There has been no fever. Associated symptoms include coughing, ear pain, headaches, nausea, rhinorrhea, sinus pain and vomiting. Pertinent negatives include no wheezing. She has tried antihistamine and decongestant (Mucinex and Zyrtec) for the symptoms. The treatment provided mild relief.      Review of Systems  HENT: Positive for ear pain and rhinorrhea.   Respiratory: Positive for cough. Negative for shortness of breath and wheezing.   Cardiovascular: Negative.   Gastrointestinal: Positive for nausea and vomiting.  Neurological: Positive for headaches.   Past Medical History:  Diagnosis Date  . Anxiety   . Arthritis   . Depression   . IBS (irritable bowel syndrome)     Social History   Social History  . Marital status: Married    Spouse name: N/A  . Number of children: N/A  . Years of education: N/A   Occupational History  . Not on file.   Social History Main Topics  . Smoking status: Former Smoker    Quit date: 08/03/1977  . Smokeless tobacco: Never Used  . Alcohol use 0.0 oz/week     Comment: occasionally  . Drug use: No  . Sexual activity: Not Currently   Other Topics Concern  . Not on file   Social History Narrative  . No narrative on file    Past Surgical History:  Procedure Laterality Date  . ABDOMINAL HYSTERECTOMY  2001   BSO ; SLING PROCEDURE endometriosis/leiomyomata  . CHOLECYSTECTOMY  1976  . DILATION AND CURETTAGE OF UTERUS    . HAND SURGERY  04-02-2011   right  . OOPHORECTOMY     BSO  . ORIF SHOULDER FRACTURE Right 06/28/2014   Procedure: RIGHT SHOULDER HEMI-ARTHROPLASTY;  Surgeon: Netta Cedars, MD;  Location: [facility];  Service: Orthopedics;  Laterality: Right;  .  TONSILLECTOMY  1954    Family History  Problem Relation Age of Onset  . Diabetes Mother   . Hypertension Mother   . Cancer Mother     Laurell Roof  . COPD Father     Allergies  Allergen Reactions  . Augmentin [Amoxicillin-Pot Clavulanate] Itching and Swelling    Headache  . Cephalexin Diarrhea  . Codeine Sulfate     REACTION: nausea, vomiting  . Fluconazole Itching and Swelling    REACTION: swelling and itching  . Morphine     REACTION: nausea, itching  . Sulfonamide Derivatives     REACTION: rash    Current Outpatient Prescriptions on File Prior to Visit  Medication Sig Dispense Refill  . Acetaminophen (TYLENOL ARTHRITIS PAIN PO) Take 1,000 mg by mouth 2 (two) times daily as needed (back pain).     Marland Kitchen ALPRAZolam (XANAX) 0.25 MG tablet Take 1 tablet (0.25 mg total) by mouth at bedtime as needed for anxiety. 30 tablet 4  . Calcium Carbonate-Vitamin D (CALCIUM + D PO) Take 1 tablet by mouth daily.     . citalopram (CELEXA) 20 MG tablet Take 1 tablet (20 mg total) by mouth daily. 30 tablet 12  . estradiol (ESTRACE) 0.5 MG tablet Take 1 tablet (0.5 mg total) by mouth daily. 30 tablet 12  . HYDROcodone-acetaminophen (NORCO/VICODIN) 5-325 MG tablet Take 1 tablet by  mouth every 6 (six) hours as needed for moderate pain. 30 tablet 0  . hyoscyamine (LEVBID) 0.375 MG 12 hr tablet Take 0.375 mg by mouth daily.     . Lactobacillus (ACIDOPHILUS PO) Take by mouth daily.    . methocarbamol (ROBAXIN) 500 MG tablet Take 1 tablet (500 mg total) by mouth every 6 (six) hours as needed for muscle spasms. 60 tablet 0  . neomycin-polymyxin-hydrocortisone (CORTISPORIN) otic solution Place 3 drops into the left ear 4 (four) times daily. 10 mL 0   No current facility-administered medications on file prior to visit.     BP 132/70   Temp 98.6 F (37 C) (Oral)   Ht 5\' 2"  (1.575 m)   Wt 172 lb 4.8 oz (78.2 kg)   BMI 31.51 kg/m       Objective:   Physical Exam  Constitutional: She is oriented  to person, place, and time. She appears well-developed and well-nourished. No distress.  HENT:  Head: Normocephalic and atraumatic.  Right Ear: Hearing, tympanic membrane, external ear and ear canal normal.  Left Ear: Hearing, tympanic membrane, external ear and ear canal normal.  Nose: Mucosal edema and rhinorrhea present. Right sinus exhibits maxillary sinus tenderness and frontal sinus tenderness. Left sinus exhibits maxillary sinus tenderness and frontal sinus tenderness.  Mouth/Throat: Uvula is midline, oropharynx is clear and moist and mucous membranes are normal. No oropharyngeal exudate, posterior oropharyngeal edema, posterior oropharyngeal erythema or tonsillar abscesses.  Eyes: Conjunctivae and EOM are normal. Pupils are equal, round, and reactive to light. Right eye exhibits no discharge. Left eye exhibits no discharge.  Neck: Normal range of motion. Neck supple. No tracheal deviation present. No thyromegaly present.  Cardiovascular: Normal rate, regular rhythm, normal heart sounds and intact distal pulses.  Exam reveals no gallop.   No murmur heard. Pulmonary/Chest: Effort normal and breath sounds normal. No respiratory distress. She has no rales. She exhibits no tenderness.  Lymphadenopathy:    She has no cervical adenopathy.  Neurological: She is alert and oriented to person, place, and time.  Skin: Skin is warm and dry. No rash noted. She is not diaphoretic. No erythema. No pallor.  Psychiatric: She has a normal mood and affect. Her behavior is normal. Judgment and thought content normal.  Nursing note and vitals reviewed.     Assessment & Plan:  1. Acute pansinusitis, recurrence not specified - Will treat due to time frame - doxycycline (VIBRAMYCIN) 100 MG capsule; Take 1 capsule (100 mg total) by mouth 2 (two) times daily.  Dispense: 14 capsule; Refill: 0 - HYDROcodone-homatropine (HYCODAN) 5-1.5 MG/5ML syrup; Take 5 mLs by mouth every 8 (eight) hours as needed for cough.   Dispense: 120 mL; Refill: 0 - Continue with Mucinex and add Flonase - Follow up with PCP if no improvement  Dorothyann Peng, NP

## 2015-10-04 NOTE — Patient Instructions (Signed)
It was great meeting you today and I am sorry you are feeling so bad.   I have sent in a prescription for Doxycycline. Take this as directed  Use the cough syrup at night.   During the day continue with Mucinex and Zyrtec. Add Flonase   General Recommendations:    Please drink plenty of fluids.  Get plenty of rest   Sleep in humidified air  Use saline nasal sprays  Netti pot   OTC Medications:  Decongestants - helps relieve congestion   Flonase (generic fluticasone) or Nasacort (generic triamcinolone) - please make sure to use the "cross-over" technique at a 45 degree angle towards the opposite eye as opposed to straight up the nasal passageway.   Sudafed (generic pseudoephedrine - Note this is the one that is available behind the pharmacy counter); Products with phenylephrine (-PE) may also be used but is often not as effective as pseudoephedrine.   If you have HIGH BLOOD PRESSURE - Coricidin HBP; AVOID any product that is -D as this contains pseudoephedrine which may increase your blood pressure.  Afrin (oxymetazoline) every 6-8 hours for up to 3 days.   Allergies - helps relieve runny nose, itchy eyes and sneezing   Claritin (generic loratidine), Allegra (fexofenidine), or Zyrtec (generic cyrterizine) for runny nose. These medications should not cause drowsiness.  Note - Benadryl (generic diphenhydramine) may be used however may cause drowsiness  Cough -   Delsym or Robitussin (generic dextromethorphan)  Expectorants - helps loosen mucus to ease removal   Mucinex (generic guaifenesin) as directed on the package.  Headaches / General Aches   Tylenol (generic acetaminophen) - DO NOT EXCEED 3 grams (3,000 mg) in a 24 hour time period  Advil/Motrin (generic ibuprofen)   Sore Throat -   Salt water gargle   Chloraseptic (generic benzocaine) spray or lozenges / Sucrets (generic dyclonine)    Sinusitis Sinusitis is redness, soreness, and inflammation of  the paranasal sinuses. Paranasal sinuses are air pockets within the bones of your face (beneath the eyes, the middle of the forehead, or above the eyes). In healthy paranasal sinuses, mucus is able to drain out, and air is able to circulate through them by way of your nose. However, when your paranasal sinuses are inflamed, mucus and air can become trapped. This can allow bacteria and other germs to grow and cause infection. Sinusitis can develop quickly and last only a short time (acute) or continue over a long period (chronic). Sinusitis that lasts for more than 12 weeks is considered chronic.  CAUSES  Causes of sinusitis include:  Allergies.  Structural abnormalities, such as displacement of the cartilage that separates your nostrils (deviated septum), which can decrease the air flow through your nose and sinuses and affect sinus drainage.  Functional abnormalities, such as when the small hairs (cilia) that line your sinuses and help remove mucus do not work properly or are not present. SIGNS AND SYMPTOMS  Symptoms of acute and chronic sinusitis are the same. The primary symptoms are pain and pressure around the affected sinuses. Other symptoms include:  Upper toothache.  Earache.  Headache.  Bad breath.  Decreased sense of smell and taste.  A cough, which worsens when you are lying flat.  Fatigue.  Fever.  Thick drainage from your nose, which often is green and may contain pus (purulent).  Swelling and warmth over the affected sinuses. DIAGNOSIS  Your health care provider will perform a physical exam. During the exam, your health care provider may:  Look in your nose for signs of abnormal growths in your nostrils (nasal polyps).  Tap over the affected sinus to check for signs of infection.  View the inside of your sinuses (endoscopy) using an imaging device that has a light attached (endoscope). If your health care provider suspects that you have chronic sinusitis, one or  more of the following tests may be recommended:  Allergy tests.  Nasal culture. A sample of mucus is taken from your nose, sent to a lab, and screened for bacteria.  Nasal cytology. A sample of mucus is taken from your nose and examined by your health care provider to determine if your sinusitis is related to an allergy. TREATMENT  Most cases of acute sinusitis are related to a viral infection and will resolve on their own within 10 days. Sometimes medicines are prescribed to help relieve symptoms (pain medicine, decongestants, nasal steroid sprays, or saline sprays).  However, for sinusitis related to a bacterial infection, your health care provider will prescribe antibiotic medicines. These are medicines that will help kill the bacteria causing the infection.  Rarely, sinusitis is caused by a fungal infection. In theses cases, your health care provider will prescribe antifungal medicine. For some cases of chronic sinusitis, surgery is needed. Generally, these are cases in which sinusitis recurs more than 3 times per year, despite other treatments. HOME CARE INSTRUCTIONS   Drink plenty of water. Water helps thin the mucus so your sinuses can drain more easily.  Use a humidifier.  Inhale steam 3 to 4 times a day (for example, sit in the bathroom with the shower running).  Apply a warm, moist washcloth to your face 3 to 4 times a day, or as directed by your health care provider.  Use saline nasal sprays to help moisten and clean your sinuses.  Take medicines only as directed by your health care provider.  If you were prescribed either an antibiotic or antifungal medicine, finish it all even if you start to feel better. SEEK IMMEDIATE MEDICAL CARE IF:  You have increasing pain or severe headaches.  You have nausea, vomiting, or drowsiness.  You have swelling around your face.  You have vision problems.  You have a stiff neck.  You have difficulty breathing. MAKE SURE YOU:    Understand these instructions.  Will watch your condition.  Will get help right away if you are not doing well or get worse. Document Released: 02/02/2005 Document Revised: 06/19/2013 Document Reviewed: 02/17/2011 [facility] Patient Information 2015 [place], [place]. This information is not intended to replace advice given to you by your health care provider. Make sure you discuss any questions you have with your health care provider.

## 2015-10-17 ENCOUNTER — Encounter: Payer: Self-pay | Admitting: Family Medicine

## 2015-10-17 ENCOUNTER — Ambulatory Visit (INDEPENDENT_AMBULATORY_CARE_PROVIDER_SITE_OTHER): Payer: Medicare Other | Admitting: Family Medicine

## 2015-10-17 VITALS — BP 112/60 | HR 73 | Temp 97.9°F | Ht 62.0 in | Wt 172.5 lb

## 2015-10-17 DIAGNOSIS — R300 Dysuria: Secondary | ICD-10-CM

## 2015-10-17 LAB — POCT URINALYSIS DIPSTICK
Bilirubin, UA: NEGATIVE
Glucose, UA: NEGATIVE
KETONES UA: NEGATIVE
Nitrite, UA: NEGATIVE
PH UA: 5
Protein, UA: NEGATIVE
Spec Grav, UA: 1.015
Urobilinogen, UA: 0.2

## 2015-10-17 MED ORDER — NITROFURANTOIN MONOHYD MACRO 100 MG PO CAPS: 100.0000 mg | ORAL_CAPSULE | Freq: Two times a day (BID) | ORAL | 0 refills | Status: DC

## 2015-10-17 NOTE — Patient Instructions (Signed)
Take the antibiotic as instructed.  Follow up if symptoms worsen or persist.

## 2015-10-17 NOTE — Progress Notes (Signed)
HPI:  Acute visit for:  Dysuria: -started today -recently took abx for sinusitis (doxy) -symptoms: dysuria, frequency, urgency -denies: fevers, malaise, NVD, abd or flank pain, vaginal symptoms, hematuria   ROS: See pertinent positives and negatives per HPI.  Past Medical History:  Diagnosis Date  . Anxiety   . Arthritis   . Depression   . IBS (irritable bowel syndrome)     Past Surgical History:  Procedure Laterality Date  . ABDOMINAL HYSTERECTOMY  2001   BSO ; SLING PROCEDURE endometriosis/leiomyomata  . CHOLECYSTECTOMY  1976  . DILATION AND CURETTAGE OF UTERUS    . HAND SURGERY  04-02-2011   right  . OOPHORECTOMY     BSO  . ORIF SHOULDER FRACTURE Right 06/28/2014   Procedure: RIGHT SHOULDER HEMI-ARTHROPLASTY;  Surgeon: Netta Cedars, MD;  Location: [facility];  Service: Orthopedics;  Laterality: Right;  . TONSILLECTOMY  1954    Family History  Problem Relation Age of Onset  . Diabetes Mother   . Hypertension Mother   . Cancer Mother     Laurell Roof  . COPD Father     Social History   Social History  . Marital status: Married    Spouse name: N/A  . Number of children: N/A  . Years of education: N/A   Social History Main Topics  . Smoking status: Former Smoker    Quit date: 08/03/1977  . Smokeless tobacco: Never Used  . Alcohol use 0.0 oz/week     Comment: occasionally  . Drug use: No  . Sexual activity: Not Currently   Other Topics Concern  . None   Social History Narrative  . None     Current Outpatient Prescriptions:  .  Acetaminophen (TYLENOL ARTHRITIS PAIN PO), Take 1,000 mg by mouth 2 (two) times daily as needed (back pain). , Disp: , Rfl:  .  ALPRAZolam (XANAX) 0.25 MG tablet, Take 1 tablet (0.25 mg total) by mouth at bedtime as needed for anxiety., Disp: 30 tablet, Rfl: 4 .  Calcium Carbonate-Vitamin D (CALCIUM + D PO), Take 1 tablet by mouth daily. , Disp: , Rfl:  .  citalopram (CELEXA) 20 MG tablet, Take 1 tablet (20 mg total) by mouth  daily., Disp: 30 tablet, Rfl: 12 .  estradiol (ESTRACE) 0.5 MG tablet, Take 1 tablet (0.5 mg total) by mouth daily., Disp: 30 tablet, Rfl: 12 .  HYDROcodone-homatropine (HYCODAN) 5-1.5 MG/5ML syrup, Take 5 mLs by mouth every 8 (eight) hours as needed for cough., Disp: 120 mL, Rfl: 0 .  hyoscyamine (LEVBID) 0.375 MG 12 hr tablet, Take 0.375 mg by mouth daily. , Disp: , Rfl:  .  Lactobacillus (ACIDOPHILUS PO), Take by mouth daily., Disp: , Rfl:  .  methocarbamol (ROBAXIN) 500 MG tablet, Take 1 tablet (500 mg total) by mouth every 6 (six) hours as needed for muscle spasms., Disp: 60 tablet, Rfl: 0 .  neomycin-polymyxin-hydrocortisone (CORTISPORIN) otic solution, Place 3 drops into the left ear 4 (four) times daily., Disp: 10 mL, Rfl: 0 .  nitrofurantoin, macrocrystal-monohydrate, (MACROBID) 100 MG capsule, Take 1 capsule (100 mg total) by mouth 2 (two) times daily., Disp: 14 capsule, Rfl: 0  EXAM:  Vitals:   10/17/15 1647  BP: 112/60  Pulse: 73  Temp: 97.9 F (36.6 C)    Body mass index is 31.55 kg/m.  GENERAL: vitals reviewed and listed above, alert, oriented, appears well hydrated and in no acute distress  HEENT: atraumatic, conjunttiva clear, no obvious abnormalities on inspection of external nose and ears  NECK: no obvious masses on inspection  LUNGS: clear to auscultation bilaterally, no wheezes, rales or rhonchi, good air movement  CV: HRRR, no peripheral edema  ABD: soft, NTTP, no CVA TTP  MS: moves all extremities without noticeable abnormality  PSYCH: pleasant and cooperative, no obvious depression or anxiety  ASSESSMENT AND PLAN:  Discussed the following assessment and plan:  Dysuria - Plan: POC Urinalysis Dipstick, Culture, Urine -udip + symptoms c/w likely UTI; opted to treat with macrobid, culture pending -Patient advised to return or notify a doctor immediately if symptoms worsen or persist or new concerns arise.  Patient Instructions  Take the antibiotic as  instructed.  Follow up if symptoms worsen or persist.     Colin Benton R., DO

## 2015-10-17 NOTE — Progress Notes (Signed)
Pre visit review using our clinic review tool, if applicable. No additional management support is needed unless otherwise documented below in the visit note. 

## 2015-10-18 DIAGNOSIS — R300 Dysuria: Secondary | ICD-10-CM | POA: Diagnosis not present

## 2015-10-21 ENCOUNTER — Other Ambulatory Visit: Payer: Self-pay | Admitting: Family Medicine

## 2015-10-21 LAB — URINE CULTURE

## 2015-10-21 MED ORDER — CIPROFLOXACIN HCL 250 MG PO TABS: 250.0000 mg | ORAL_TABLET | Freq: Two times a day (BID) | ORAL | 0 refills | Status: DC

## 2015-11-17 DIAGNOSIS — A609 Anogenital herpesviral infection, unspecified: Secondary | ICD-10-CM

## 2015-11-17 HISTORY — DX: Anogenital herpesviral infection, unspecified: A60.9

## 2015-11-20 ENCOUNTER — Ambulatory Visit (INDEPENDENT_AMBULATORY_CARE_PROVIDER_SITE_OTHER): Payer: Medicare Other | Admitting: Gynecology

## 2015-11-20 ENCOUNTER — Encounter: Payer: Self-pay | Admitting: Gynecology

## 2015-11-20 VITALS — BP 124/76 | Ht 63.0 in | Wt 172.0 lb

## 2015-11-20 DIAGNOSIS — Z01411 Encounter for gynecological examination (general) (routine) with abnormal findings: Secondary | ICD-10-CM | POA: Diagnosis not present

## 2015-11-20 DIAGNOSIS — F341 Dysthymic disorder: Secondary | ICD-10-CM | POA: Diagnosis not present

## 2015-11-20 DIAGNOSIS — N952 Postmenopausal atrophic vaginitis: Secondary | ICD-10-CM | POA: Diagnosis not present

## 2015-11-20 DIAGNOSIS — Z79890 Hormone replacement therapy (postmenopausal): Secondary | ICD-10-CM | POA: Diagnosis not present

## 2015-11-20 DIAGNOSIS — N766 Ulceration of vulva: Secondary | ICD-10-CM

## 2015-11-20 MED ORDER — ESTRADIOL 0.5 MG PO TABS: 0.5000 mg | ORAL_TABLET | Freq: Every day | ORAL | 12 refills | Status: DC

## 2015-11-20 MED ORDER — ALPRAZOLAM 0.25 MG PO TABS: 0.2500 mg | ORAL_TABLET | Freq: Every evening | ORAL | 4 refills | Status: DC | PRN

## 2015-11-20 MED ORDER — CITALOPRAM HYDROBROMIDE 20 MG PO TABS: 20.0000 mg | ORAL_TABLET | Freq: Every day | ORAL | 12 refills | Status: DC

## 2015-11-20 NOTE — Progress Notes (Signed)
Danetta Roxburgh [facility] [birth year] [id number]        70 y.o.  G0P0000  for annual exam.  Several issues noted below.  Past medical history,surgical history, problem list, medications, allergies, family history and social history were all reviewed and documented as reviewed in the EPIC chart.  ROS:  Performed with pertinent positives and negatives included in the history, assessment and plan.   Additional significant findings :  None   Exam: Caryn Bee assistant Vitals:   11/20/15 1403  BP: 124/76  Weight: 172 lb (78 kg)  Height: 5\' 3"  (1.6 m)   Body mass index is 30.47 kg/m.  General appearance:  Normal affect, orientation and appearance. Skin: Grossly normal HEENT: Without gross lesions.  No cervical or supraclavicular adenopathy. Thyroid normal.  Lungs:  Clear without wheezing, rales or rhonchi Cardiac: RR, without RMG Abdominal:  Soft, nontender, without masses, guarding, rebound, organomegaly or hernia Breasts:  Examined lying and sitting without masses, retractions, discharge or axillary adenopathy. Pelvic:  Ext, BUS, Vagina with atrophic changes. Ulcerative area junction left perineal body and buttocks  Adnexa without masses or tenderness    Anus and perineum normal   Rectovaginal normal sphincter tone without palpated masses or tenderness.    Assessment/Plan:  70 y.o. G0P0000 female for annual exam.   1. Ulcerative area left perineal body/buttocks area. Been present for several days. Suspicious for viral. HSV screen done. Reviewed all this with the patient. She does note that this seems to come and go at times consistent with a viral etiology. I discussed possible Valtrex twice daily for several days at the earliest onset if indeed it does show virus. Will call patient with the viral culture results when available. 2. HRT. Patient on estradiol 0.5 mg. Status post TAH/BSO for endometriosis and leiomyoma 2001. I reviewed the most current NAMS 2017 guidelines with her. Benefits  as far as symptom relief and possible cardiovascular and bone health when started early versus risks to include thrombosis such as stroke heart attack DVT and possible breast cancer issues. The patient understands the issues and wants to continue at this time. Refill 1 year provided. 3. Anxiety/depression. Patient is on Celexa 20 mg daily and uses Xanax 0.25 mg when necessary. Husband in the terminal phases of cancer. Patient requests to continue on these medications for now. Does not appear to have any significant side effects from the medication. Refill times both provided. 4. Pap smear 2016. No Pap smear done today. No history of significant abnormal Pap smears. Discussed stop screening based on current screening guidelines with age and hysterectomy history. Will readdress on an annual basis. 5. Mammography overdo. She injured her right shoulder and cannot lift her arm up and was told by [facility] she could not have a standard mammogram but they could do modified views. Will call in order and she knows to call our office if she does not hear from them within 2 weeks. SBE monthly reviewed. 6. Colonoscopy 2009. Repeat at Dr. Liliane Channel recommendation. 7. DEXA 2011 normal. Patient had heel DEXA 2016 which was normal. Limited value of the heel DEXA reviewed as not necessarily reflective of other sites discussed. Patient comfortable not doing a full DEXA at this time as she has continued on HRT. Will readdress next year at age 43. 40. Health maintenance. No routine lab work done as patient reports this done elsewhere. Follow up for her mammogram is arranged. Follow up on HSV screen. Follow up in one year, sooner as needed.  Greater than 10 minutes of my time in excess of her gynecologic exam was spent in direct face to face counseling and coordination of care in regards to her problems of HRT and new NAMS 2017 guidelines, perineal ulcer and implications/possibilities, anxiety/depression.    Anastasio Auerbach  MD, 2:28 PM 11/20/2015

## 2015-11-20 NOTE — Patient Instructions (Signed)
Office will call to arrange her mammogram. Call my office if you do not hear from them within 2 weeks.

## 2015-11-20 NOTE — Addendum Note (Signed)
Addended by: Nelva Nay on: 11/20/2015 02:37 PM   Modules accepted: Orders

## 2015-11-21 ENCOUNTER — Telehealth: Payer: Self-pay | Admitting: *Deleted

## 2015-11-21 NOTE — Telephone Encounter (Signed)
-----   Message from Anastasio Auerbach, MD sent at 11/20/2015  2:35 PM EDT ----- Patient due for mammogram at [facility]. She broke her right shoulder and is unable to lift her right arm. She was told by [facility] they could do a different type study. They did not tell her which one. Please call [facility] and explain the situation that she needs a screening mammogram but cannot lift her right arm past her shoulder. Arrange whatever they recommend for screening.

## 2015-11-21 NOTE — Telephone Encounter (Signed)
Spoke with [facility] about the below and I was told there is no different type of study for mammogram screening. They will not lift her arm as high, but the test would be done the same way. I relayed this to pt and her mammogram is scheduled on 12/04/15 @ 1:30pm.

## 2015-11-22 DIAGNOSIS — L820 Inflamed seborrheic keratosis: Secondary | ICD-10-CM | POA: Diagnosis not present

## 2015-11-22 LAB — SURESWAB HSV, TYPE 1/2 DNA, PCR
HSV 1 DNA: NOT DETECTED
HSV 2 DNA: DETECTED — AB

## 2015-11-24 ENCOUNTER — Encounter: Payer: Self-pay | Admitting: Gynecology

## 2015-11-26 ENCOUNTER — Other Ambulatory Visit: Payer: Self-pay | Admitting: Gynecology

## 2015-11-26 MED ORDER — VALACYCLOVIR HCL 500 MG PO TABS: 500.0000 mg | ORAL_TABLET | Freq: Two times a day (BID) | ORAL | 5 refills | Status: DC

## 2015-12-04 ENCOUNTER — Ambulatory Visit (INDEPENDENT_AMBULATORY_CARE_PROVIDER_SITE_OTHER): Payer: Medicare Other | Admitting: Podiatry

## 2015-12-04 ENCOUNTER — Encounter: Payer: Self-pay | Admitting: Podiatry

## 2015-12-04 VITALS — BP 132/72 | HR 72 | Resp 14

## 2015-12-04 DIAGNOSIS — M79609 Pain in unspecified limb: Principal | ICD-10-CM

## 2015-12-04 DIAGNOSIS — Q828 Other specified congenital malformations of skin: Secondary | ICD-10-CM | POA: Diagnosis not present

## 2015-12-04 DIAGNOSIS — M79676 Pain in unspecified toe(s): Secondary | ICD-10-CM

## 2015-12-04 DIAGNOSIS — B351 Tinea unguium: Secondary | ICD-10-CM | POA: Diagnosis not present

## 2015-12-04 NOTE — Progress Notes (Signed)
Patient ID: Brianna Mack, female   DOB: 11-Jun-1945, 70 y.o.   MRN: [id number] Complaint:  Visit Type: Patient presents to my office for continued preventative foot care services. Complaint: Patient states" my nails have grown long and thick and become painful to walk and wear shoes" . The patient presents for preventative foot care services. She has developed painful callus under the ball of her right foot.  Podiatric Exam: Vascular: dorsalis pedis and posterior tibial pulses are palpable bilateral. Capillary return is immediate. Temperature gradient is WNL. Skin turgor WNL  Sensorium: Normal Semmes Weinstein monofilament test. Normal tactile sensation bilaterally. Nail Exam: Pt has thick disfigured discolored nails with subungual debris noted bilateral entire nail hallux through fifth toenails Ulcer Exam: There is no evidence of ulcer or pre-ulcerative changes or infection. Orthopedic Exam: Muscle tone and strength are WNL. No limitations in general ROM. No crepitus or effusions noted. Foot type and digits show no abnormalities. Bony prominences are unremarkable. Skin:  Porokeratosis sub3/4 right foot.. No infection or ulcers  Diagnosis:  Onychomycosis, , Pain in right toe, pain in left toes  Treatment & Plan Procedures and Treatment: Consent by patient was obtained for treatment procedures. The patient understood the discussion of treatment and procedures well. All questions were answered thoroughly reviewed. Debridement of mycotic and hypertrophic toenails, 1 through 5 bilateral and clearing of subungual debris. No ulceration, no infection noted. Debridement of porokeratosis Return Visit-Office Procedure: Patient instructed to return to the office for a follow up visit 10 weeks for continued evaluation and treatment.  Gardiner Barefoot DPM

## 2015-12-09 DIAGNOSIS — K580 Irritable bowel syndrome with diarrhea: Secondary | ICD-10-CM | POA: Diagnosis not present

## 2015-12-13 ENCOUNTER — Ambulatory Visit: Payer: Medicare Other

## 2015-12-19 ENCOUNTER — Emergency Department (HOSPITAL_BASED_OUTPATIENT_CLINIC_OR_DEPARTMENT_OTHER): Payer: Medicare Other

## 2015-12-19 ENCOUNTER — Encounter (HOSPITAL_BASED_OUTPATIENT_CLINIC_OR_DEPARTMENT_OTHER): Payer: Self-pay | Admitting: *Deleted

## 2015-12-19 ENCOUNTER — Emergency Department (HOSPITAL_BASED_OUTPATIENT_CLINIC_OR_DEPARTMENT_OTHER)
Admission: EM | Admit: 2015-12-19 | Discharge: 2015-12-19 | Disposition: A | Discharge status: Home / Self Care | Payer: Medicare Other | Attending: Emergency Medicine | Admitting: Emergency Medicine

## 2015-12-19 DIAGNOSIS — S8991XA Unspecified injury of right lower leg, initial encounter: Secondary | ICD-10-CM | POA: Diagnosis not present

## 2015-12-19 DIAGNOSIS — M25562 Pain in left knee: Secondary | ICD-10-CM | POA: Insufficient documentation

## 2015-12-19 DIAGNOSIS — Z87891 Personal history of nicotine dependence: Secondary | ICD-10-CM | POA: Insufficient documentation

## 2015-12-19 DIAGNOSIS — S8002XA Contusion of left knee, initial encounter: Secondary | ICD-10-CM | POA: Diagnosis not present

## 2015-12-19 DIAGNOSIS — M25561 Pain in right knee: Secondary | ICD-10-CM | POA: Diagnosis not present

## 2015-12-19 DIAGNOSIS — Y999 Unspecified external cause status: Secondary | ICD-10-CM | POA: Diagnosis not present

## 2015-12-19 DIAGNOSIS — S01511A Laceration without foreign body of lip, initial encounter: Secondary | ICD-10-CM | POA: Diagnosis not present

## 2015-12-19 DIAGNOSIS — Y939 Activity, unspecified: Secondary | ICD-10-CM | POA: Insufficient documentation

## 2015-12-19 DIAGNOSIS — W010XXA Fall on same level from slipping, tripping and stumbling without subsequent striking against object, initial encounter: Secondary | ICD-10-CM | POA: Diagnosis not present

## 2015-12-19 DIAGNOSIS — Y929 Unspecified place or not applicable: Secondary | ICD-10-CM | POA: Insufficient documentation

## 2015-12-19 DIAGNOSIS — S0993XA Unspecified injury of face, initial encounter: Secondary | ICD-10-CM | POA: Diagnosis present

## 2015-12-19 DIAGNOSIS — W19XXXA Unspecified fall, initial encounter: Secondary | ICD-10-CM

## 2015-12-19 DIAGNOSIS — M7989 Other specified soft tissue disorders: Secondary | ICD-10-CM | POA: Diagnosis not present

## 2015-12-19 MED ORDER — ACETAMINOPHEN 500 MG PO TABS: 500.0000 mg | ORAL_TABLET | Freq: Four times a day (QID) | ORAL | 0 refills | Status: DC | PRN

## 2015-12-19 MED ORDER — LIDOCAINE-EPINEPHRINE (PF) 2 %-1:200000 IJ SOLN
10.0000 mL | Freq: Once | INTRAMUSCULAR | Status: AC
  Administered 2015-12-19: 10 mL
  Filled 2015-12-19: qty 10

## 2015-12-19 MED ORDER — LIDOCAINE-EPINEPHRINE-TETRACAINE (LET) SOLUTION
3.0000 mL | Freq: Once | NASAL | Status: AC
  Administered 2015-12-19: 3 mL via TOPICAL
  Filled 2015-12-19: qty 3

## 2015-12-19 MED ORDER — BACITRACIN ZINC 500 UNIT/GM EX OINT: 1.0000 "application " | TOPICAL_OINTMENT | Freq: Two times a day (BID) | CUTANEOUS | 1 refills | Status: DC

## 2015-12-19 NOTE — ED Notes (Signed)
Pt alert, NAD, calm, interactive, resps e/u, speaking in clear complete sentences, no dyspnea noted, appropriate, LET applied to R upper lip, sister at [facility], pt to xray via stretcher.

## 2015-12-19 NOTE — ED Provider Notes (Signed)
And fell 4:30 PM today injuring right knee and also suffered laceration to upper lip as result of event. Patient is alert Glasgow Coma Score 15 HEENT exam laceration. Upper lip otherwise normal cephalic atraumatic. Right lower extremity mildly swollen and tender anteriorly. No Lockman's sign no post or drawer sign no collateral weakness. Repeat pulse 2+. She walks with slight limp favoring the right leg   Orlie Dakin, MD 12/19/15 2038

## 2015-12-19 NOTE — Discharge Instructions (Signed)
You have absorbable sutures in your face.  Please use a cotton ball with hydrogen peroxide in 5 days to help remove the sutures to your lip. Use bacitracin ointment twice a day.  After two weeks use scar cream to the area For the next year avoid sun exposure to the scar and wear sunscreen.

## 2015-12-19 NOTE — ED Notes (Signed)
Pt is in good condition, discharge instructions reviewed, follow up care reviewed, home care education reviewed, prescription medication reviewed; patient verbalized understanding; patient is ambulatory and went home with sister

## 2015-12-19 NOTE — ED Notes (Signed)
PA at bedside suturing, Pt tolerating it well.

## 2015-12-19 NOTE — ED Notes (Signed)
Knee sleeve applied, provided pt education on home care; pt verbalized understanding.

## 2015-12-19 NOTE — ED Notes (Addendum)
Back to xray for another view, LET re-applied

## 2015-12-19 NOTE — ED Notes (Signed)
Back to xray for sunrise view of R knee, no changes.

## 2015-12-19 NOTE — ED Triage Notes (Addendum)
She tripped and fell. Laceration to her upper lip. Her teeth are intact but gum is sore. FYI her husbands funeral is tomorrow. She is tearful at triage. Pain in both her knees but worse on the right.

## 2015-12-19 NOTE — ED Provider Notes (Signed)
[name]   CSN: [id number] Arrival date & time: 12/19/15  1737   By signing my name below, I, [name], attest that this documentation has been prepared under the direction and in the presence of [name], [name]. Electronically Signed: [name], ED Scribe. 12/19/2015. 7:06 PM.   History   Chief Complaint Chief Complaint  Patient presents with  . Fall    The history is provided by the patient. No language interpreter was used.   HPI Comments:  Brianna Mack is a 70 y.o. female who presents to the Emergency Department s/p fall that occurred PTA. Pt states that she tripped over a box and fell on concrete in the garage, landing first on her knees and then on her face. Pt complains of bilateral knee pain, but that the pain is worse on the right knee. Pt also notes a laceration to her upper lip. Pt states that her teeth are in normal alignment. No other facial pain.  Tetanus is UTD. No alleviating factors noted. Pt denies LOC, ear pain, eye pain, pain elsewhere, headache, neck pain, back pain, fevers, numbness, weakness or lightheadedness.    Past Medical History:  Diagnosis Date  . Anxiety   . Arthritis   . Depression   . HSV (herpes simplex virus) anogenital infection 11/2015  . IBS (irritable bowel syndrome)     Patient Active Problem List   Diagnosis Date Noted  . Shoulder fracture 06/28/2014  . Fracture dislocation of shoulder joint 06/27/2014  . ABDOMINAL PAIN, LOWER 05/28/2009  . ANXIETY DEPRESSION 03/26/2009  . MUSCLE SPASM 03/26/2009  . MYOSITIS 03/26/2009  . IRRITABLE BOWEL SYNDROME, HX OF 03/26/2009    Past Surgical History:  Procedure Laterality Date  . ABDOMINAL HYSTERECTOMY  2001   BSO ; SLING PROCEDURE endometriosis/leiomyomata  . CHOLECYSTECTOMY  1976  . DILATION AND CURETTAGE OF UTERUS    . HAND SURGERY  04-02-2011   right  . OOPHORECTOMY     BSO  . ORIF SHOULDER FRACTURE Right 06/28/2014   Procedure: RIGHT SHOULDER HEMI-ARTHROPLASTY;  Surgeon: Netta Cedars, MD;  Location: [facility];  Service: Orthopedics;  Laterality: Right;  . TONSILLECTOMY  1954    OB History    Gravida Para Term Preterm AB Living   0 0 0 0 0 0   SAB TAB Ectopic Multiple Live Births   0 0 0 0         Home Medications    Prior to Admission medications   Medication Sig Start Date End Date Taking? Authorizing Provider  acetaminophen (TYLENOL) 500 MG tablet Take 1 tablet (500 mg total) by mouth every 6 (six) hours as needed for mild pain or moderate pain. 12/19/15   [name], [name]  ALPRAZolam Duanne Moron) 0.25 MG tablet Take 1 tablet (0.25 mg total) by mouth at bedtime as needed for anxiety. 11/20/15   Anastasio Auerbach, MD  bacitracin ointment Apply 1 application topically 2 (two) times daily. 12/19/15   [name], [name]  Calcium Carbonate-Vitamin D (CALCIUM + D PO) Take 1 tablet by mouth daily.     Historical Provider, MD  citalopram (CELEXA) 20 MG tablet Take 1 tablet (20 mg total) by mouth daily. 11/20/15   Anastasio Auerbach, MD  estradiol (ESTRACE) 0.5 MG tablet Take 1 tablet (0.5 mg total) by mouth daily. 11/20/15   Anastasio Auerbach, MD  hyoscyamine (LEVBID) 0.375 MG 12 hr tablet Take 0.375 mg by mouth daily.  Historical Provider, MD  Lactobacillus (ACIDOPHILUS PO) Take by mouth daily.    Historical Provider, MD  valACYclovir (VALTREX) 500 MG tablet Take 1 tablet (500 mg total) by mouth 2 (two) times daily. 11/26/15   Anastasio Auerbach, MD    Family History Family History  Problem Relation Age of Onset  . Diabetes Mother   . Hypertension Mother   . Cancer Mother     Laurell Roof  . COPD Father     Social History Social History  Substance Use Topics  . Smoking status: Former Smoker    Quit date: 08/03/1977  . Smokeless tobacco: Never Used  . Alcohol use 0.0 oz/week     Comment: occasionally     Allergies   Augmentin [amoxicillin-pot clavulanate]; Cephalexin; Codeine sulfate;  Fluconazole; Morphine; and Sulfonamide derivatives   Review of Systems Review of Systems  Constitutional: Negative for fever.  HENT: Negative for ear pain.   Eyes: Negative for pain and visual disturbance.  Respiratory: Negative for cough and shortness of breath.   Cardiovascular: Negative for chest pain.  Gastrointestinal: Negative for abdominal pain, nausea and vomiting.  Genitourinary: Negative for dysuria.  Musculoskeletal: Positive for arthralgias and joint swelling. Negative for back pain and neck pain.  Skin: Positive for wound.  Neurological: Negative for syncope, weakness, light-headedness, numbness and headaches.     Physical Exam Updated Vital Signs BP 140/74 (BP Location: Left Arm)   Pulse 79   Temp 98.1 F (36.7 C) (Oral)   Resp 16   Ht 5\' 3"  (1.6 m)   Wt 76.2 kg   SpO2 98%   BMI 29.76 kg/m   Physical Exam  Constitutional: She is oriented to person, place, and time. She appears well-developed and well-nourished. No distress.  Nontoxic appearing.  HENT:  Head: Normocephalic.  Right Ear: External ear normal.  Left Ear: External ear normal.  Nose: Nose normal.  Mouth/Throat: Oropharynx is clear and moist.  1 cm stellate laceration to anterior lip above vermilion border. 2 cm laceration to the upper inner lip that is not full thickness on further exam.  No broken teeth, jaw in alignment, no evidence of other facial trauma.   Eyes: Conjunctivae and EOM are normal. Pupils are equal, round, and reactive to light. Right eye exhibits no discharge. Left eye exhibits no discharge.  Neck: Normal range of motion. Neck supple.  Cardiovascular: Normal rate, regular rhythm, normal heart sounds and intact distal pulses.   Bilateral radial, posterior tibialis and dorsalis pedis pulses are intact.    Pulmonary/Chest: Effort normal and breath sounds normal. No respiratory distress. She has no wheezes. She has no rales.  Abdominal: Soft. There is no tenderness.    Musculoskeletal: Normal range of motion. She exhibits tenderness. She exhibits no edema.  TTP to bilateral anterior knees with some developing ecchymosis to her anterior right knee. No knee laxity noted. No TTP to her bilateral hips, ankles, elbows, wrists, or clavicles.   Lymphadenopathy:    She has no cervical adenopathy.  Neurological: She is alert and oriented to person, place, and time. No cranial nerve deficit. Coordination normal.  Skin: Skin is warm and dry. Capillary refill takes less than 2 seconds. No rash noted. She is not diaphoretic. No erythema. No pallor.  See HEENT   Psychiatric: She has a normal mood and affect. Her behavior is normal.  Nursing note and vitals reviewed.    ED Treatments / Results  DIAGNOSTIC STUDIES:  Oxygen Saturation is 98% on RA, normal by my  interpretation.    COORDINATION OF CARE:  7:06 PM Discussed treatment plan with pt at bedside and pt agreed to plan.   Labs (all labs ordered are listed, but only abnormal results are displayed) Labs Reviewed - No data to display  EKG  EKG Interpretation None       Radiology Dg Knee 1-2 Views Right  Result Date: 12/19/2015 CLINICAL DATA:  [place] tonight, knee swelling. EXAM: RIGHT KNEE - 1-2 VIEW COMPARISON:  RIGHT knee radiographs December 19, 2015 at 1947 hours FINDINGS: Single sunrise view. No evidence of fracture, dislocation, or joint effusion. No evidence of arthropathy or other focal bone abnormality. Soft tissues are unremarkable. IMPRESSION: Negative. Electronically Signed   By: Elon Alas M.D.   On: 12/19/2015 21:05   Dg Knee Complete 4 Views Left  Result Date: 12/19/2015 CLINICAL DATA:  Fall today with left knee injury swelling and bruising EXAM: LEFT KNEE - COMPLETE 4+ VIEW COMPARISON:  None. FINDINGS: No acute fracture or malalignment. Mild degenerative changes of the medial compartment. Nonspecific soft tissue calcification adjacent to the lateral femoral condyles, possibly related  to remote injury. No large effusion. IMPRESSION: No acute osseous abnormality. Electronically Signed   By: Donavan Foil M.D.   On: 12/19/2015 20:15   Dg Knee Complete 4 Views Right  Result Date: 12/19/2015 CLINICAL DATA:  Fall with pain EXAM: RIGHT KNEE - COMPLETE 4+ VIEW COMPARISON:  None. FINDINGS: Mild narrowing of the medial compartment. Questionable cortical regularity along the medial aspect of patella. IMPRESSION: 1. Questionable cortical irregularity along the medial pole of the patella, cannot rule out small avulsion injuries. Sunrise view may be helpful for further evaluation. 2. Otherwise no acute osseous abnormality. Electronically Signed   By: Donavan Foil M.D.   On: 12/19/2015 20:18    Procedures .Marland KitchenLaceration Repair Date/Time: 12/19/2015 9:49 PM Performed by: [name] Authorized by: [name]   Consent:    Consent obtained:  Verbal   Consent given by:  Patient   Risks discussed:  Pain, poor cosmetic result and need for additional repair Anesthesia (see MAR for exact dosages):    Anesthesia method:  Topical application and local infiltration   Topical anesthetic:  LET   Local anesthetic:  Lidocaine 2% WITH epi Laceration details:    Location:  Lip   Lip location:  Upper exterior lip   Length (cm):  1 Repair type:    Repair type:  Intermediate Pre-procedure details:    Preparation:  Patient was prepped and draped in usual sterile fashion Exploration:    Hemostasis achieved with:  LET and direct pressure   Wound exploration: entire depth of wound probed and visualized   Treatment:    Area cleansed with:  Saline   Amount of cleaning:  Standard   Irrigation solution:  Sterile saline Skin repair:    Repair method:  Sutures   Suture size:  5-0   Suture material:  Plain gut   Suture technique:  Simple interrupted   Number of sutures:  4 Approximation:    Approximation:  Close Post-procedure details:    Dressing:  Antibiotic ointment and non-adherent  dressing   Patient tolerance of procedure:  Tolerated well, no immediate complications .Marland KitchenLaceration Repair Date/Time: 12/19/2015 10:06 PM Performed by: [name] Authorized by: [name]   Consent:    Consent obtained:  Verbal   Consent given by:  Patient   Risks discussed:  Infection, pain and need for additional repair Anesthesia (see MAR for exact dosages):  Anesthesia method:  Local infiltration   Local anesthetic:  Lidocaine 2% WITH epi Laceration details:    Location:  Lip   Lip location:  Upper interior lip   Length (cm):  2 Repair type:    Repair type:  Simple Pre-procedure details:    Preparation:  Patient was prepped and draped in usual sterile fashion Exploration:    Hemostasis achieved with:  Direct pressure   Wound exploration: entire depth of wound probed and visualized   Treatment:    Area cleansed with:  Saline   Amount of cleaning:  Standard   Irrigation solution:  Sterile saline   Irrigation method:  Pressure wash Mucous membrane repair:    Suture size:  5-0   Suture material:  Vicryl (Vicryl Rapide )   Suture technique:  Simple interrupted   Number of sutures:  2 Approximation:    Approximation:  Close Post-procedure details:    Dressing:  Open (no dressing)   Patient tolerance of procedure:  Tolerated well, no immediate complications   (including critical care time)  Medications Ordered in ED Medications  lidocaine-EPINEPHrine-tetracaine (LET) solution (3 mLs Topical Given 12/19/15 1934)  lidocaine-EPINEPHrine (XYLOCAINE W/EPI) 2 %-1:200000 (PF) injection 10 mL (10 mLs Infiltration Given 12/19/15 1934)     Initial Impression / Assessment and Plan / ED Course  I have reviewed the triage vital signs and the nursing notes.  Pertinent labs & imaging results that were available during my care of the patient were reviewed by me and considered in my medical decision making (see chart for details).  Clinical Course   This  is a 70 y.o.  female who presents to the Emergency Department s/p fall that occurred PTA. Pt states that she tripped over a box and fell on concrete in the garage, landing first on her knees and then on her face. Pt complains of bilateral knee pain, but that the pain is worse on the right knee. Pt also notes a laceration to her upper lip. Pt states that her teeth are in normal alignment. No other facial pain.  Tetanus is UTD. No alleviating factors noted. Pt denies LOC. On exam the patient is afebrile and nontoxic appearing. She has a 1 cm stellate laceration to her upper lip above her vermilion border. Initially it was thought to be full-thickness. On further exam the laceration is not full-thickness. She also has a mucosal laceration to her upper inner lip that is approximately 2 cm in length. Bleeding is controlled. No broken teeth. Jaws and alignment. No other signs of head trauma. She also has some mild edema to her right knee with some slight ecchymosis. No knee laxity. She is ambulating without difficulty. X-ray of her left knee is unremarkable. X-ray of her right knee shows a questionable cortical irregularity along the medial portal of the patella. A sunrise view was recommended. On sunrise view there is no abnormality. Lacerations were heard by me and tolerated well by the patient. I used 5-0 fast gut to her external upper lip laceration. She tolerated this well. There was no fast absorbing gut available. I discussed methods to remove this plain gut at a faster pace in about 5 days for superior cosmetic outcome. I discussed wound care instructions. I advised the patient to follow-up with their primary care provider this week. I advised the patient to return to the emergency department with new or worsening symptoms or new concerns. The patient verbalized understanding and agreement with plan.     Final Clinical  Impressions(s) / ED Diagnoses   Final diagnoses:  Fall, initial encounter  Lip laceration, initial  encounter  Acute pain of right knee  Acute pain of left knee    New Prescriptions Discharge Medication List as of 12/19/2015 10:18 PM    START taking these medications   Details  acetaminophen (TYLENOL) 500 MG tablet Take 1 tablet (500 mg total) by mouth every 6 (six) hours as needed for mild pain or moderate pain., Starting Thu 12/19/2015, Print    bacitracin ointment Apply 1 application topically 2 (two) times daily., Starting Thu 12/19/2015, Print        I personally performed the services described in this documentation, which was scribed in my presence. The recorded information has been reviewed and is accurate.       [name], [name] 12/20/15 [place], MD 12/21/15 [phone]

## 2015-12-25 ENCOUNTER — Ambulatory Visit (INDEPENDENT_AMBULATORY_CARE_PROVIDER_SITE_OTHER): Payer: Medicare Other | Admitting: Adult Health

## 2015-12-25 ENCOUNTER — Encounter: Payer: Self-pay | Admitting: Adult Health

## 2015-12-25 VITALS — BP 142/70 | Ht 63.0 in | Wt 171.0 lb

## 2015-12-25 DIAGNOSIS — Z4802 Encounter for removal of sutures: Secondary | ICD-10-CM | POA: Diagnosis not present

## 2015-12-25 DIAGNOSIS — Z1231 Encounter for screening mammogram for malignant neoplasm of breast: Secondary | ICD-10-CM | POA: Diagnosis not present

## 2015-12-25 LAB — HM MAMMOGRAPHY

## 2015-12-25 NOTE — Progress Notes (Signed)
Subjective:    Patient ID: Brianna Mack, female    DOB: 1945/08/31, 70 y.o.   MRN: [id number]  HPI  70 year old female who presents for suture removal from her right upper lip. Records indicate that she was seen in the ER 6 days ago after tripping in the garage injuring her right knee and causing a laceration to her right lip.   X rays of her right knee was negative for fracture or dislocation   She had 4 sutures placed into the outside of her lip and 2 dissolvable on the inside.  Review of Systems  Constitutional: Negative.   Respiratory: Negative.   Cardiovascular: Negative.   Musculoskeletal: Positive for arthralgias and joint swelling.       Bruising to right and left knee  Skin: Positive for wound.  All other systems reviewed and are negative.  Past Medical History:  Diagnosis Date  . Anxiety   . Arthritis   . Depression   . HSV (herpes simplex virus) anogenital infection 11/2015  . IBS (irritable bowel syndrome)     Social History   Social History  . Marital status: Widowed    Spouse name: N/A  . Number of children: N/A  . Years of education: N/A   Occupational History  . Not on file.   Social History Main Topics  . Smoking status: Former Smoker    Quit date: 08/03/1977  . Smokeless tobacco: Never Used  . Alcohol use 0.0 oz/week     Comment: occasionally  . Drug use: No  . Sexual activity: Not Currently     Comment: 1st intercourse 70 yo-Fewer than 5 partners   Other Topics Concern  . Not on file   Social History Narrative  . No narrative on file    Past Surgical History:  Procedure Laterality Date  . ABDOMINAL HYSTERECTOMY  2001   BSO ; SLING PROCEDURE endometriosis/leiomyomata  . CHOLECYSTECTOMY  1976  . DILATION AND CURETTAGE OF UTERUS    . HAND SURGERY  04-02-2011   right  . OOPHORECTOMY     BSO  . ORIF SHOULDER FRACTURE Right 06/28/2014   Procedure: RIGHT SHOULDER HEMI-ARTHROPLASTY;  Surgeon: Netta Cedars, MD;  Location: [facility];   Service: Orthopedics;  Laterality: Right;  . TONSILLECTOMY  1954    Family History  Problem Relation Age of Onset  . Diabetes Mother   . Hypertension Mother   . Cancer Mother     Laurell Roof  . COPD Father     Allergies  Allergen Reactions  . Augmentin [Amoxicillin-Pot Clavulanate] Itching and Swelling    Headache  . Cephalexin Diarrhea  . Codeine Sulfate     REACTION: nausea, vomiting  . Fluconazole Itching and Swelling    REACTION: swelling and itching  . Morphine     REACTION: nausea, itching  . Sulfonamide Derivatives     REACTION: rash    Current Outpatient Prescriptions on File Prior to Visit  Medication Sig Dispense Refill  . acetaminophen (TYLENOL) 500 MG tablet Take 1 tablet (500 mg total) by mouth every 6 (six) hours as needed for mild pain or moderate pain. 30 tablet 0  . ALPRAZolam (XANAX) 0.25 MG tablet Take 1 tablet (0.25 mg total) by mouth at bedtime as needed for anxiety. 30 tablet 4  . bacitracin ointment Apply 1 application topically 2 (two) times daily. 28 g 1  . Calcium Carbonate-Vitamin D (CALCIUM + D PO) Take 1 tablet by mouth daily.     Marland Kitchen  citalopram (CELEXA) 20 MG tablet Take 1 tablet (20 mg total) by mouth daily. 30 tablet 12  . estradiol (ESTRACE) 0.5 MG tablet Take 1 tablet (0.5 mg total) by mouth daily. 30 tablet 12  . hyoscyamine (LEVBID) 0.375 MG 12 hr tablet Take 0.375 mg by mouth daily.     . Lactobacillus (ACIDOPHILUS PO) Take by mouth daily.     No current facility-administered medications on file prior to visit.     BP (!) 142/70   Ht 5\' 3"  (1.6 m)   Wt 171 lb (77.6 kg)   BMI 30.29 kg/m       Objective:   Physical Exam  Constitutional: She is oriented to person, place, and time. She appears well-developed and well-nourished. No distress.  Cardiovascular: Normal rate, regular rhythm, normal heart sounds and intact distal pulses.  Exam reveals no gallop and no friction rub.   No murmur heard. Pulmonary/Chest: Effort normal and  breath sounds normal. No respiratory distress. She has no wheezes. She has no rales. She exhibits no tenderness.  Musculoskeletal: Normal range of motion. She exhibits no edema, tenderness or deformity.  Trace swelling to bilateral knees. Bruising in various stages of healing to bilateral knees.   She has slow but steady gait  Neurological: She is alert and oriented to person, place, and time. She has normal reflexes. She displays normal reflexes. No cranial nerve deficit. She exhibits normal muscle tone. Coordination normal.  Skin: Skin is warm and dry. No rash noted. She is not diaphoretic. No erythema. No pallor.  Well healed wound to right upper lip. 4 sutures noted on the outside and 2 on the inside  Psychiatric: She has a normal mood and affect. Her behavior is normal. Judgment and thought content normal.  Nursing note and vitals reviewed.     Assessment & Plan:  1. Visit for suture removal 4 sutures removed. Patient tolerated procedure well.  - Follow up with PCP as needed  Dorothyann Peng, NP

## 2015-12-26 ENCOUNTER — Encounter: Payer: Self-pay | Admitting: Internal Medicine

## 2016-01-02 ENCOUNTER — Ambulatory Visit (INDEPENDENT_AMBULATORY_CARE_PROVIDER_SITE_OTHER): Payer: Medicare Other

## 2016-01-02 ENCOUNTER — Ambulatory Visit: Payer: Medicare Other

## 2016-01-02 DIAGNOSIS — Z23 Encounter for immunization: Secondary | ICD-10-CM | POA: Diagnosis not present

## 2016-01-03 ENCOUNTER — Encounter: Payer: Self-pay | Admitting: Internal Medicine

## 2016-01-03 DIAGNOSIS — L570 Actinic keratosis: Secondary | ICD-10-CM | POA: Diagnosis not present

## 2016-01-03 DIAGNOSIS — M25511 Pain in right shoulder: Secondary | ICD-10-CM | POA: Diagnosis not present

## 2016-01-03 DIAGNOSIS — L821 Other seborrheic keratosis: Secondary | ICD-10-CM | POA: Diagnosis not present

## 2016-01-03 DIAGNOSIS — L853 Xerosis cutis: Secondary | ICD-10-CM | POA: Diagnosis not present

## 2016-01-15 DIAGNOSIS — M25511 Pain in right shoulder: Secondary | ICD-10-CM | POA: Diagnosis not present

## 2016-01-15 DIAGNOSIS — M25611 Stiffness of right shoulder, not elsewhere classified: Secondary | ICD-10-CM | POA: Diagnosis not present

## 2016-01-21 DIAGNOSIS — M25511 Pain in right shoulder: Secondary | ICD-10-CM | POA: Diagnosis not present

## 2016-01-21 DIAGNOSIS — M25611 Stiffness of right shoulder, not elsewhere classified: Secondary | ICD-10-CM | POA: Diagnosis not present

## 2016-01-24 DIAGNOSIS — M25611 Stiffness of right shoulder, not elsewhere classified: Secondary | ICD-10-CM | POA: Diagnosis not present

## 2016-01-24 DIAGNOSIS — M25511 Pain in right shoulder: Secondary | ICD-10-CM | POA: Diagnosis not present

## 2016-01-27 DIAGNOSIS — M25611 Stiffness of right shoulder, not elsewhere classified: Secondary | ICD-10-CM | POA: Diagnosis not present

## 2016-01-27 DIAGNOSIS — M25511 Pain in right shoulder: Secondary | ICD-10-CM | POA: Diagnosis not present

## 2016-01-30 DIAGNOSIS — M25611 Stiffness of right shoulder, not elsewhere classified: Secondary | ICD-10-CM | POA: Diagnosis not present

## 2016-01-30 DIAGNOSIS — M25511 Pain in right shoulder: Secondary | ICD-10-CM | POA: Diagnosis not present

## 2016-02-03 DIAGNOSIS — M25611 Stiffness of right shoulder, not elsewhere classified: Secondary | ICD-10-CM | POA: Diagnosis not present

## 2016-02-03 DIAGNOSIS — M25511 Pain in right shoulder: Secondary | ICD-10-CM | POA: Diagnosis not present

## 2016-02-04 DIAGNOSIS — H40013 Open angle with borderline findings, low risk, bilateral: Secondary | ICD-10-CM | POA: Diagnosis not present

## 2016-02-06 DIAGNOSIS — M25611 Stiffness of right shoulder, not elsewhere classified: Secondary | ICD-10-CM | POA: Diagnosis not present

## 2016-02-06 DIAGNOSIS — M25511 Pain in right shoulder: Secondary | ICD-10-CM | POA: Diagnosis not present

## 2016-02-11 DIAGNOSIS — M25611 Stiffness of right shoulder, not elsewhere classified: Secondary | ICD-10-CM | POA: Diagnosis not present

## 2016-02-11 DIAGNOSIS — M25511 Pain in right shoulder: Secondary | ICD-10-CM | POA: Diagnosis not present

## 2016-02-12 ENCOUNTER — Ambulatory Visit: Payer: Medicare Other | Admitting: Podiatry

## 2016-02-13 ENCOUNTER — Ambulatory Visit: Payer: Medicare Other | Admitting: Podiatry

## 2016-02-13 DIAGNOSIS — M25611 Stiffness of right shoulder, not elsewhere classified: Secondary | ICD-10-CM | POA: Diagnosis not present

## 2016-02-13 DIAGNOSIS — M25511 Pain in right shoulder: Secondary | ICD-10-CM | POA: Diagnosis not present

## 2016-02-19 ENCOUNTER — Other Ambulatory Visit (INDEPENDENT_AMBULATORY_CARE_PROVIDER_SITE_OTHER): Payer: Medicare Other

## 2016-02-19 DIAGNOSIS — Z0000 Encounter for general adult medical examination without abnormal findings: Secondary | ICD-10-CM | POA: Diagnosis not present

## 2016-02-19 DIAGNOSIS — M25611 Stiffness of right shoulder, not elsewhere classified: Secondary | ICD-10-CM | POA: Diagnosis not present

## 2016-02-19 DIAGNOSIS — M25511 Pain in right shoulder: Secondary | ICD-10-CM | POA: Diagnosis not present

## 2016-02-19 LAB — POC URINALSYSI DIPSTICK (AUTOMATED)
BILIRUBIN UA: NEGATIVE
GLUCOSE UA: NEGATIVE
Ketones, UA: NEGATIVE
LEUKOCYTES UA: NEGATIVE
NITRITE UA: NEGATIVE
Protein, UA: NEGATIVE
Spec Grav, UA: 1.01
Urobilinogen, UA: 0.2
pH, UA: 6

## 2016-02-19 LAB — HEPATIC FUNCTION PANEL
ALBUMIN: 3.8 g/dL (ref 3.5–5.2)
ALT: 10 U/L (ref 0–35)
AST: 13 U/L (ref 0–37)
Alkaline Phosphatase: 88 U/L (ref 39–117)
Bilirubin, Direct: 0.1 mg/dL (ref 0.0–0.3)
Total Bilirubin: 0.5 mg/dL (ref 0.2–1.2)
Total Protein: 6.4 g/dL (ref 6.0–8.3)

## 2016-02-19 LAB — BASIC METABOLIC PANEL
BUN: 14 mg/dL (ref 6–23)
CALCIUM: 8.9 mg/dL (ref 8.4–10.5)
CO2: 28 meq/L (ref 19–32)
Chloride: 104 mEq/L (ref 96–112)
Creatinine, Ser: 0.64 mg/dL (ref 0.40–1.20)
GFR: 97.64 mL/min (ref >60.00)
GLUCOSE: 90 mg/dL (ref 70–99)
Potassium: 4.2 mEq/L (ref 3.5–5.1)
SODIUM: 141 meq/L (ref 135–145)

## 2016-02-19 LAB — CBC WITH DIFFERENTIAL/PLATELET
BASOS ABS: 0.1 10*3/uL (ref 0.0–0.1)
Basophils Relative: 1 % (ref 0.0–3.0)
EOS PCT: 2.6 % (ref 0.0–5.0)
Eosinophils Absolute: 0.2 10*3/uL (ref 0.0–0.7)
HCT: 39.4 % (ref 36.0–46.0)
Hemoglobin: 12.9 g/dL (ref 12.0–15.0)
LYMPHS ABS: 1.8 10*3/uL (ref 0.7–4.0)
Lymphocytes Relative: 22.5 % (ref 12.0–46.0)
MCHC: 32.8 g/dL (ref 30.0–36.0)
MCV: 85.3 fl (ref 78.0–100.0)
MONOS PCT: 7.1 % (ref 3.0–12.0)
Monocytes Absolute: 0.6 10*3/uL (ref 0.1–1.0)
NEUTROS ABS: 5.3 10*3/uL (ref 1.4–7.7)
NEUTROS PCT: 66.8 % (ref 43.0–77.0)
PLATELETS: 344 10*3/uL (ref 150.0–400.0)
RBC: 4.61 Mil/uL (ref 3.87–5.11)
RDW: 14.9 % (ref 11.5–15.5)
WBC: 8 10*3/uL (ref 4.0–10.5)

## 2016-02-19 LAB — LIPID PANEL
CHOLESTEROL: 214 mg/dL — AB (ref 0–200)
HDL: 78 mg/dL (ref >39.00)
LDL Cholesterol: 123 mg/dL — ABNORMAL HIGH (ref 0–99)
NonHDL: 135.53
TRIGLYCERIDES: 62 mg/dL (ref 0.0–149.0)
Total CHOL/HDL Ratio: 3
VLDL: 12.4 mg/dL (ref 0.0–40.0)

## 2016-02-19 LAB — TSH: TSH: 3.42 u[IU]/mL (ref 0.35–4.50)

## 2016-02-20 ENCOUNTER — Ambulatory Visit (INDEPENDENT_AMBULATORY_CARE_PROVIDER_SITE_OTHER): Payer: Medicare Other | Admitting: Podiatry

## 2016-02-20 VITALS — Ht 63.0 in | Wt 171.0 lb

## 2016-02-20 DIAGNOSIS — Q828 Other specified congenital malformations of skin: Secondary | ICD-10-CM | POA: Diagnosis not present

## 2016-02-20 DIAGNOSIS — B351 Tinea unguium: Secondary | ICD-10-CM

## 2016-02-20 DIAGNOSIS — M79609 Pain in unspecified limb: Secondary | ICD-10-CM | POA: Diagnosis not present

## 2016-02-20 NOTE — Progress Notes (Signed)
Patient ID: Brianna Mack, female   DOB: 1945-02-27, 71 y.o.   MRN: [id number] Complaint:  Visit Type: Patient presents to my office for continued preventative foot care services. Complaint: Patient states" my nails have grown long and thick and become painful to walk and wear shoes" . The patient presents for preventative foot care services. She has developed painful callus under the ball of her right foot.  Podiatric Exam: Vascular: dorsalis pedis and posterior tibial pulses are palpable bilateral. Capillary return is immediate. Temperature gradient is WNL. Skin turgor WNL  Sensorium: Normal Semmes Weinstein monofilament test. Normal tactile sensation bilaterally. Nail Exam: Pt has thick disfigured discolored nails with subungual debris noted bilateral entire nail hallux through fifth toenails Ulcer Exam: There is no evidence of ulcer or pre-ulcerative changes or infection. Orthopedic Exam: Muscle tone and strength are WNL. No limitations in general ROM. No crepitus or effusions noted. Foot type and digits show no abnormalities. Bony prominences are unremarkable. Skin:  Porokeratosis sub3/4 right foot.. No infection or ulcers  Diagnosis:  Onychomycosis, , Pain in right toe, pain in left toes  Treatment & Plan Procedures and Treatment: Consent by patient was obtained for treatment procedures. The patient understood the discussion of treatment and procedures well. All questions were answered thoroughly reviewed. Debridement of mycotic and hypertrophic toenails, 1 through 5 bilateral and clearing of subungual debris. No ulceration, no infection noted. Debridement of porokeratosis Return Visit-Office Procedure: Patient instructed to return to the office for a follow up visit 10 weeks for continued evaluation and treatment.  Gardiner Barefoot DPM

## 2016-02-21 DIAGNOSIS — M25611 Stiffness of right shoulder, not elsewhere classified: Secondary | ICD-10-CM | POA: Diagnosis not present

## 2016-02-21 DIAGNOSIS — M25511 Pain in right shoulder: Secondary | ICD-10-CM | POA: Diagnosis not present

## 2016-02-24 DIAGNOSIS — M25511 Pain in right shoulder: Secondary | ICD-10-CM | POA: Diagnosis not present

## 2016-02-24 DIAGNOSIS — M25611 Stiffness of right shoulder, not elsewhere classified: Secondary | ICD-10-CM | POA: Diagnosis not present

## 2016-02-25 ENCOUNTER — Ambulatory Visit (INDEPENDENT_AMBULATORY_CARE_PROVIDER_SITE_OTHER): Payer: Medicare Other | Admitting: Internal Medicine

## 2016-02-25 ENCOUNTER — Encounter: Payer: Self-pay | Admitting: Internal Medicine

## 2016-02-25 VITALS — BP 128/72 | HR 82 | Temp 98.3°F | Ht 62.5 in | Wt 177.0 lb

## 2016-02-25 DIAGNOSIS — F341 Dysthymic disorder: Secondary | ICD-10-CM | POA: Diagnosis not present

## 2016-02-25 DIAGNOSIS — Z0000 Encounter for general adult medical examination without abnormal findings: Secondary | ICD-10-CM | POA: Diagnosis not present

## 2016-02-25 MED ORDER — ALPRAZOLAM 0.25 MG PO TABS: 0.2500 mg | ORAL_TABLET | Freq: Every evening | ORAL | 4 refills | Status: DC | PRN

## 2016-02-25 NOTE — Progress Notes (Signed)
Subjective:    Patient ID: Brianna Mack, female    DOB: Jul 29, 1945, 71 y.o.   MRN: [id number]  HPI  71 year old patient who is seen today for a preventive health examination and Medicare wellness visit. She enjoys excellent health but has been adjusting to the death of her husband approximately 3 months ago.  She has had some situational anxiety, depression, which has improved.  Her last colonoscopy was 2009.  She has seen her GI consultant recently and follow-up colonoscopy is planned for 2019  Past Medical History:  Diagnosis Date  . Anxiety   . Arthritis   . Depression   . HSV (herpes simplex virus) anogenital infection 11/2015  . IBS (irritable bowel syndrome)      Social History   Social History  . Marital status: Widowed    Spouse name: N/A  . Number of children: N/A  . Years of education: N/A   Occupational History  . Not on file.   Social History Main Topics  . Smoking status: Former Smoker    Quit date: 08/03/1977  . Smokeless tobacco: Never Used  . Alcohol use 0.0 oz/week     Comment: occasionally  . Drug use: No  . Sexual activity: Not Currently     Comment: 1st intercourse 71 yo-Fewer than 5 partners   Other Topics Concern  . Not on file   Social History Narrative  . No narrative on file    Past Surgical History:  Procedure Laterality Date  . ABDOMINAL HYSTERECTOMY  2001   BSO ; SLING PROCEDURE endometriosis/leiomyomata  . CHOLECYSTECTOMY  1976  . DILATION AND CURETTAGE OF UTERUS    . HAND SURGERY  04-02-2011   right  . OOPHORECTOMY     BSO  . ORIF SHOULDER FRACTURE Right 06/28/2014   Procedure: RIGHT SHOULDER HEMI-ARTHROPLASTY;  Surgeon: Netta Cedars, MD;  Location: [facility];  Service: Orthopedics;  Laterality: Right;  . TONSILLECTOMY  1954    Family History  Problem Relation Age of Onset  . Diabetes Mother   . Hypertension Mother   . Cancer Mother     Laurell Roof  . COPD Father     Allergies  Allergen Reactions  . Augmentin  [Amoxicillin-Pot Clavulanate] Itching and Swelling    Headache  . Cephalexin Diarrhea  . Codeine Sulfate     REACTION: nausea, vomiting  . Fluconazole Itching and Swelling    REACTION: swelling and itching  . Morphine     REACTION: nausea, itching  . Sulfonamide Derivatives     REACTION: rash    Current Outpatient Prescriptions on File Prior to Visit  Medication Sig Dispense Refill  . acetaminophen (TYLENOL) 500 MG tablet Take 1 tablet (500 mg total) by mouth every 6 (six) hours as needed for mild pain or moderate pain. 30 tablet 0  . bacitracin ointment Apply 1 application topically 2 (two) times daily. 28 g 1  . Calcium Carbonate-Vitamin D (CALCIUM + D PO) Take 1 tablet by mouth daily.     . citalopram (CELEXA) 20 MG tablet Take 1 tablet (20 mg total) by mouth daily. 30 tablet 12  . estradiol (ESTRACE) 0.5 MG tablet Take 1 tablet (0.5 mg total) by mouth daily. 30 tablet 12  . hyoscyamine (LEVBID) 0.375 MG 12 hr tablet Take 0.375 mg by mouth daily.     . Lactobacillus (ACIDOPHILUS PO) Take by mouth daily.     No current facility-administered medications on file prior to visit.     BP 128/72 (  BP Location: Left Arm, Patient Position: Sitting, Cuff Size: Normal)   Pulse 82   Temp 98.3 F (36.8 C) (Oral)   Ht 5' 2.5" (1.588 m)   Wt 177 lb (80.3 kg)   SpO2 98%   BMI 31.86 kg/m   Medicare wellness visit  1. Risk factors, based on past  M,S,F history.  No cardiovascular risk factors  2.  Physical activities: has been primary caregiver for her husband who has been disabled due to cerebrovascular disease.  He died of complications of cancer this past spring and now she has been able to be more active.  She does walk her dog daily and plans to resume activities at her health club  3.  Depression/mood:situational anxiety or depression  4.  Hearing:no deficits  5.  ADL's:independent  6.  Fall risk:low  7.  Home safety:no problems identified  8.  Height weight, and visual  acuity;height and weight stable no change in visual acuity is followed annually by ophthalmology  9.  Counseling:heart healthy diet recommended.  Encouraged efforts to resume health club activities and moderate diet  10. Lab orders based on risk factors:laboratory profile reviewed and discussed  11. Referral :ot appropriate at this time  12. Care plan:more rigorous exercise and modest weight loss encouraged  13. Cognitive assessment: alert in order with normal affect.  No cognitive dysfunction  14. Screening: Patient provided with a written and personalized 5-10 year screening schedule in the AVS.    15. Provider List Update: primary care GI ophthalmology gynecology as well as dermatology    Review of Systems  Constitutional: Negative for appetite change, fatigue, fever and unexpected weight change.  HENT: Negative for congestion, dental problem, ear pain, hearing loss, mouth sores, nosebleeds, sinus pressure, sore throat, tinnitus, trouble swallowing and voice change.   Eyes: Negative for photophobia, pain, redness and visual disturbance.  Respiratory: Negative for cough, chest tightness and shortness of breath.   Cardiovascular: Negative for chest pain, palpitations and leg swelling.  Gastrointestinal: Negative for abdominal distention, abdominal pain, blood in stool, constipation, diarrhea, nausea, rectal pain and vomiting.  Genitourinary: Negative for difficulty urinating, dysuria, flank pain, frequency, genital sores, hematuria, menstrual problem, pelvic pain, urgency, vaginal bleeding, vaginal discharge and vaginal pain.  Musculoskeletal: Negative for arthralgias, back pain and neck stiffness.  Skin: Negative for rash.  Neurological: Negative for dizziness, syncope, speech difficulty, weakness, light-headedness, numbness and headaches.  Hematological: Negative for adenopathy. Does not bruise/bleed easily.  Psychiatric/Behavioral: Positive for dysphoric mood. Negative for  agitation, behavioral problems, self-injury and suicidal ideas. The patient is nervous/anxious.        Objective:   Physical Exam  Constitutional: She is oriented to person, place, and time. She appears well-developed and well-nourished.  HENT:  Head: Normocephalic and atraumatic.  Right Ear: External ear normal.  Left Ear: External ear normal.  Mouth/Throat: Oropharynx is clear and moist.  Eyes: Conjunctivae and EOM are normal.  Neck: Normal range of motion. Neck supple. No JVD present. No thyromegaly present.  Cardiovascular: Normal rate, regular rhythm, normal heart sounds and intact distal pulses.   No murmur heard. Pulmonary/Chest: Effort normal and breath sounds normal. She has no wheezes. She has no rales.  Abdominal: Soft. Bowel sounds are normal. She exhibits no distension and no mass. There is no tenderness. There is no rebound and no guarding.  Musculoskeletal: Normal range of motion. She exhibits no edema or tenderness.  Neurological: She is alert and oriented to person, place, and time. She has  normal reflexes. No cranial nerve deficit. She exhibits normal muscle tone. Coordination normal.  Skin: Skin is warm and dry. No rash noted.  Psychiatric: She has a normal mood and affect. Her behavior is normal.          Assessment & Plan:   Preventive health examination Medicare wellness visit Situational anxiety with depressed mood  No change in medical regimen Increase activity at her health club and modest weight loss encouraged Continue vitamin D and calcium supplementation Follow-up OB/GYN Follow-up colonoscopy one year Yearly eye  Examination  Nyoka Cowden

## 2016-02-25 NOTE — Progress Notes (Signed)
Pre visit review using our clinic review tool, if applicable. No additional management support is needed unless otherwise documented below in the visit note. 

## 2016-02-25 NOTE — Patient Instructions (Addendum)
It is important that you exercise regularly, at least 20 minutes 3 to 4 times per week.  If you develop chest pain or shortness of breath seek  medical attention.  You need to lose weight.  Consider a lower calorie diet and regular exercise.  Take a calcium supplement, plus [phone] units of vitamin D  Return in one year for follow-up  Health Maintenance for Postmenopausal Women Introduction Menopause is a normal process in which your reproductive ability comes to an end. This process happens gradually over a span of months to years, usually between the ages of 54 and 38. Menopause is complete when you have missed 12 consecutive menstrual periods. It is important to talk with your health care provider about some of the most common conditions that affect postmenopausal women, such as heart disease, cancer, and bone loss (osteoporosis). Adopting a healthy lifestyle and getting preventive care can help to promote your health and wellness. Those actions can also lower your chances of developing some of these common conditions. What should I know about menopause? During menopause, you may experience a number of symptoms, such as:  Moderate-to-severe hot flashes.  Night sweats.  Decrease in sex drive.  Mood swings.  Headaches.  Tiredness.  Irritability.  Memory problems.  Insomnia. Choosing to treat or not to treat menopausal changes is an individual decision that you make with your health care provider. What should I know about hormone replacement therapy and supplements? Hormone therapy products are effective for treating symptoms that are associated with menopause, such as hot flashes and night sweats. Hormone replacement carries certain risks, especially as you become older. If you are thinking about using estrogen or estrogen with progestin treatments, discuss the benefits and risks with your health care provider. What should I know about heart disease and stroke? Heart disease,  heart attack, and stroke become more likely as you age. This may be due, in part, to the hormonal changes that your body experiences during menopause. These can affect how your body processes dietary fats, triglycerides, and cholesterol. Heart attack and stroke are both medical emergencies. There are many things that you can do to help prevent heart disease and stroke:  Have your blood pressure checked at least every 1-2 years. High blood pressure causes heart disease and increases the risk of stroke.  If you are 31-26 years old, ask your health care provider if you should take aspirin to prevent a heart attack or a stroke.  Do not use any tobacco products, including cigarettes, chewing tobacco, or electronic cigarettes. If you need help quitting, ask your health care provider.  It is important to eat a healthy diet and maintain a healthy weight.  Be sure to include plenty of vegetables, fruits, low-fat dairy products, and lean protein.  Avoid eating foods that are high in solid fats, added sugars, or salt (sodium).  Get regular exercise. This is one of the most important things that you can do for your health.  Try to exercise for at least 150 minutes each week. The type of exercise that you do should increase your heart rate and make you sweat. This is known as moderate-intensity exercise.  Try to do strengthening exercises at least twice each week. Do these in addition to the moderate-intensity exercise.  Know your numbers.Ask your health care provider to check your cholesterol and your blood glucose. Continue to have your blood tested as directed by your health care provider. What should I know about cancer screening? There are several  types of cancer. Take the following steps to reduce your risk and to catch any cancer development as early as possible. Breast Cancer  Practice breast self-awareness.  This means understanding how your breasts normally appear and feel.  It also means  doing regular breast self-exams. Let your health care provider know about any changes, no matter how small.  If you are 6 or older, have a clinician do a breast exam (clinical breast exam or CBE) every year. Depending on your age, family history, and medical history, it may be recommended that you also have a yearly breast X-ray (mammogram).  If you have a family history of breast cancer, talk with your health care provider about genetic screening.  If you are at high risk for breast cancer, talk with your health care provider about having an MRI and a mammogram every year.  Breast cancer (BRCA) gene test is recommended for women who have family members with BRCA-related cancers. Results of the assessment will determine the need for genetic counseling and BRCA1 and for BRCA2 testing. BRCA-related cancers include these types:  Breast. This occurs in males or females.  Ovarian.  Tubal. This may also be called fallopian tube cancer.  Cancer of the abdominal or pelvic lining (peritoneal cancer).  Prostate.  Pancreatic. Cervical, Uterine, and Ovarian Cancer  Your health care provider may recommend that you be screened regularly for cancer of the pelvic organs. These include your ovaries, uterus, and vagina. This screening involves a pelvic exam, which includes checking for microscopic changes to the surface of your cervix (Pap test).  For women ages 21-65, health care providers may recommend a pelvic exam and a Pap test every three years. For women ages 31-65, they may recommend the Pap test and pelvic exam, combined with testing for human papilloma virus (HPV), every five years. Some types of HPV increase your risk of cervical cancer. Testing for HPV may also be done on women of any age who have unclear Pap test results.  Other health care providers may not recommend any screening for nonpregnant women who are considered low risk for pelvic cancer and have no symptoms. Ask your health care  provider if a screening pelvic exam is right for you.  If you have had past treatment for cervical cancer or a condition that could lead to cancer, you need Pap tests and screening for cancer for at least 20 years after your treatment. If Pap tests have been discontinued for you, your risk factors (such as having a new sexual partner) need to be reassessed to determine if you should start having screenings again. Some women have medical problems that increase the chance of getting cervical cancer. In these cases, your health care provider may recommend that you have screening and Pap tests more often.  If you have a family history of uterine cancer or ovarian cancer, talk with your health care provider about genetic screening.  If you have vaginal bleeding after reaching menopause, tell your health care provider.  There are currently no reliable tests available to screen for ovarian cancer. Lung Cancer  Lung cancer screening is recommended for adults 54-34 years old who are at high risk for lung cancer because of a history of smoking. A yearly low-dose CT scan of the lungs is recommended if you:  Currently smoke.  Have a history of at least 30 pack-years of smoking and you currently smoke or have quit within the past 15 years. A pack-year is smoking an average of one  pack of cigarettes per day for one year. Yearly screening should:  Continue until it has been 15 years since you quit.  Stop if you develop a health problem that would prevent you from having lung cancer treatment. Colorectal Cancer  This type of cancer can be detected and can often be prevented.  Routine colorectal cancer screening usually begins at age 75 and continues through age 10.  If you have risk factors for colon cancer, your health care provider may recommend that you be screened at an earlier age.  If you have a family history of colorectal cancer, talk with your health care provider about genetic  screening.  Your health care provider may also recommend using home test kits to check for hidden blood in your stool.  A small camera at the end of a tube can be used to examine your colon directly (sigmoidoscopy or colonoscopy). This is done to check for the earliest forms of colorectal cancer.  Direct examination of the colon should be repeated every 5-10 years until age 69. However, if early forms of precancerous polyps or small growths are found or if you have a family history or genetic risk for colorectal cancer, you may need to be screened more often. Skin Cancer  Check your skin from head to toe regularly.  Monitor any moles. Be sure to tell your health care provider:  About any new moles or changes in moles, especially if there is a change in a mole's shape or color.  If you have a mole that is larger than the size of a pencil eraser.  If any of your family members has a history of skin cancer, especially at a young age, talk with your health care provider about genetic screening.  Always use sunscreen. Apply sunscreen liberally and repeatedly throughout the day.  Whenever you are outside, protect yourself by wearing long sleeves, pants, a wide-brimmed hat, and sunglasses. What should I know about osteoporosis? Osteoporosis is a condition in which bone destruction happens more quickly than new bone creation. After menopause, you may be at an increased risk for osteoporosis. To help prevent osteoporosis or the bone fractures that can happen because of osteoporosis, the following is recommended:  If you are 84-55 years old, get at least 1,000 mg of calcium and at least 600 mg of vitamin D per day.  If you are older than age 16 but younger than age 68, get at least 1,200 mg of calcium and at least 600 mg of vitamin D per day.  If you are older than age 81, get at least 1,200 mg of calcium and at least 800 mg of vitamin D per day. Smoking and excessive alcohol intake increase the  risk of osteoporosis. Eat foods that are rich in calcium and vitamin D, and do weight-bearing exercises several times each week as directed by your health care provider. What should I know about how menopause affects my mental health? Depression may occur at any age, but it is more common as you become older. Common symptoms of depression include:  Low or sad mood.  Changes in sleep patterns.  Changes in appetite or eating patterns.  Feeling an overall lack of motivation or enjoyment of activities that you previously enjoyed.  Frequent crying spells. Talk with your health care provider if you think that you are experiencing depression. What should I know about immunizations? It is important that you get and maintain your immunizations. These include:  Tetanus, diphtheria, and pertussis (Tdap) booster  vaccine.  Influenza every year before the flu season begins.  Pneumonia vaccine.  Shingles vaccine. Your health care provider may also recommend other immunizations. This information is not intended to replace advice given to you by your health care provider. Make sure you discuss any questions you have with your health care provider. Document Released: 03/27/2005 Document Revised: 08/23/2015 Document Reviewed: 11/06/2014  2017 Elsevier

## 2016-02-28 DIAGNOSIS — M25511 Pain in right shoulder: Secondary | ICD-10-CM | POA: Diagnosis not present

## 2016-02-28 DIAGNOSIS — M25611 Stiffness of right shoulder, not elsewhere classified: Secondary | ICD-10-CM | POA: Diagnosis not present

## 2016-03-03 DIAGNOSIS — M25511 Pain in right shoulder: Secondary | ICD-10-CM | POA: Diagnosis not present

## 2016-03-03 DIAGNOSIS — M25611 Stiffness of right shoulder, not elsewhere classified: Secondary | ICD-10-CM | POA: Diagnosis not present

## 2016-03-09 DIAGNOSIS — M25511 Pain in right shoulder: Secondary | ICD-10-CM | POA: Diagnosis not present

## 2016-03-09 DIAGNOSIS — M25611 Stiffness of right shoulder, not elsewhere classified: Secondary | ICD-10-CM | POA: Diagnosis not present

## 2016-03-11 DIAGNOSIS — M25611 Stiffness of right shoulder, not elsewhere classified: Secondary | ICD-10-CM | POA: Diagnosis not present

## 2016-03-11 DIAGNOSIS — M25511 Pain in right shoulder: Secondary | ICD-10-CM | POA: Diagnosis not present

## 2016-03-17 DIAGNOSIS — M25511 Pain in right shoulder: Secondary | ICD-10-CM | POA: Diagnosis not present

## 2016-03-17 DIAGNOSIS — M25611 Stiffness of right shoulder, not elsewhere classified: Secondary | ICD-10-CM | POA: Diagnosis not present

## 2016-03-18 DIAGNOSIS — L820 Inflamed seborrheic keratosis: Secondary | ICD-10-CM | POA: Diagnosis not present

## 2016-03-18 DIAGNOSIS — C44629 Squamous cell carcinoma of skin of left upper limb, including shoulder: Secondary | ICD-10-CM | POA: Diagnosis not present

## 2016-03-18 DIAGNOSIS — L821 Other seborrheic keratosis: Secondary | ICD-10-CM | POA: Diagnosis not present

## 2016-03-18 DIAGNOSIS — D485 Neoplasm of uncertain behavior of skin: Secondary | ICD-10-CM | POA: Diagnosis not present

## 2016-03-25 DIAGNOSIS — M25511 Pain in right shoulder: Secondary | ICD-10-CM | POA: Diagnosis not present

## 2016-03-25 DIAGNOSIS — M25611 Stiffness of right shoulder, not elsewhere classified: Secondary | ICD-10-CM | POA: Diagnosis not present

## 2016-03-31 DIAGNOSIS — M25511 Pain in right shoulder: Secondary | ICD-10-CM | POA: Diagnosis not present

## 2016-03-31 DIAGNOSIS — M25611 Stiffness of right shoulder, not elsewhere classified: Secondary | ICD-10-CM | POA: Diagnosis not present

## 2016-04-07 DIAGNOSIS — M25511 Pain in right shoulder: Secondary | ICD-10-CM | POA: Diagnosis not present

## 2016-04-07 DIAGNOSIS — M25611 Stiffness of right shoulder, not elsewhere classified: Secondary | ICD-10-CM | POA: Diagnosis not present

## 2016-04-13 DIAGNOSIS — M65842 Other synovitis and tenosynovitis, left hand: Secondary | ICD-10-CM | POA: Diagnosis not present

## 2016-04-13 DIAGNOSIS — M79642 Pain in left hand: Secondary | ICD-10-CM | POA: Diagnosis not present

## 2016-04-13 DIAGNOSIS — M1812 Unilateral primary osteoarthritis of first carpometacarpal joint, left hand: Secondary | ICD-10-CM | POA: Diagnosis not present

## 2016-04-15 DIAGNOSIS — M25511 Pain in right shoulder: Secondary | ICD-10-CM | POA: Diagnosis not present

## 2016-04-21 ENCOUNTER — Encounter: Payer: Self-pay | Admitting: Internal Medicine

## 2016-04-21 ENCOUNTER — Ambulatory Visit (INDEPENDENT_AMBULATORY_CARE_PROVIDER_SITE_OTHER): Payer: Medicare Other | Admitting: Internal Medicine

## 2016-04-21 VITALS — BP 130/72 | HR 70 | Temp 98.5°F | Ht 62.5 in | Wt 177.2 lb

## 2016-04-21 DIAGNOSIS — Z23 Encounter for immunization: Secondary | ICD-10-CM | POA: Diagnosis not present

## 2016-04-21 DIAGNOSIS — H6121 Impacted cerumen, right ear: Secondary | ICD-10-CM

## 2016-04-21 NOTE — Progress Notes (Signed)
Pre visit review using our clinic review tool, if applicable. No additional management support is needed unless otherwise documented below in the visit note. 

## 2016-04-21 NOTE — Patient Instructions (Addendum)
Earwax Buildup Your ears make a substance called earwax. It may also be called cerumen. Sometimes, too much earwax builds up in your ear canal. This can cause ear pain and make it harder for you to hear. CAUSES This condition is caused by too much earwax production or buildup. RISK FACTORS The following factors may make you more likely to develop this condition:  Cleaning your ears often with swabs.  Having narrow ear canals.  Having earwax that is overly thick or sticky.  Having eczema.  Being dehydrated. SYMPTOMS Symptoms of this condition include:  Reduced hearing.  Ear drainage.  Ear pain.  Ear itch.  A feeling of fullness in the ear or feeling that the ear is plugged.  Ringing in the ear.  Coughing. DIAGNOSIS Your health care provider can diagnose this condition based on your symptoms and medical history. Your health care provider will also do an ear exam to look inside your ear with a scope (otoscope). You may also have a hearing test. TREATMENT Treatment for this condition includes:  Over-the-counter or prescription ear drops to soften the earwax.  Earwax removal by a health care provider. This may be done:  By flushing the ear with body-temperature water.  With a medical instrument that has a loop at the end (earwax curette).  With a suction device. HOME CARE INSTRUCTIONS  Take over-the-counter and prescription medicines only as told by your health care provider.  Do not put any objects, including an ear swab, into your ear. You can clean the opening of your ear canal with a washcloth.  Drink enough water to keep your urine clear or pale yellow.  If you have frequent earwax buildup or you use hearing aids, consider seeing your health care provider every 6-12 months for routine preventive ear cleanings. Keep all follow-up visits as told by your health care provider. SEEK MEDICAL CARE IF:  You have ear pain.  Your condition does not improve with  treatment.  You have hearing loss.  You have blood, pus, or other fluid coming from your ear. This information is not intended to replace advice given to you by your health care provider. Make sure you discuss any questions you have with your health care provider. Document Released: 03/12/2004 Document Revised: 05/27/2015 Document Reviewed: 09/19/2014 Elsevier Interactive Patient Education  2017 [employer] Inc[employer]  

## 2016-04-21 NOTE — Progress Notes (Signed)
   Subjective:    Patient ID: Brianna Mack, female    DOB: Feb 09, 1946, 71 y.o.   MRN: [id number]  HPI  71 year old patient with diminished auditory acuity and some fullness involving the right ear  Review of Systems  HENT: Positive for hearing loss.        Objective:   Physical Exam  HENT:  Cerumen impaction.  Right canal A few flecks of cerumen left canal          Assessment & Plan:   Cerumen impaction.  Right canal.  Canal irrigated until clear  Instructions dispensed  Return as scheduled for her annual exam  Nyoka Cowden

## 2016-04-23 NOTE — Addendum Note (Signed)
Addended by: Wyvonne Lenz on: 04/23/2016 11:26 AM   Modules accepted: Orders

## 2016-04-29 ENCOUNTER — Encounter: Payer: Self-pay | Admitting: Podiatry

## 2016-04-29 ENCOUNTER — Ambulatory Visit (INDEPENDENT_AMBULATORY_CARE_PROVIDER_SITE_OTHER): Payer: Medicare Other | Admitting: Podiatry

## 2016-04-29 DIAGNOSIS — B351 Tinea unguium: Secondary | ICD-10-CM

## 2016-04-29 DIAGNOSIS — M79609 Pain in unspecified limb: Secondary | ICD-10-CM

## 2016-04-29 NOTE — Progress Notes (Signed)
Patient ID: Brianna Mack, female   DOB: 13-Mar-1945, 70 y.o.   MRN: [id number] Complaint:  Visit Type: Patient presents to my office for continued preventative foot care services. Complaint: Patient states" my nails have grown long and thick and become painful to walk and wear shoes" . The patient presents for preventative foot care services. She says her callus has improved and there is no pain.  Podiatric Exam: Vascular: dorsalis pedis and posterior tibial pulses are palpable bilateral. Capillary return is immediate. Temperature gradient is WNL. Skin turgor WNL  Sensorium: Normal Semmes Weinstein monofilament test. Normal tactile sensation bilaterally. Nail Exam: Pt has thick disfigured discolored nails with subungual debris noted bilateral entire nail hallux through fifth toenails Ulcer Exam: There is no evidence of ulcer or pre-ulcerative changes or infection. Orthopedic Exam: Muscle tone and strength are WNL. No limitations in general ROM. No crepitus or effusions noted. Foot type and digits show no abnormalities. Bony prominences are unremarkable. Skin:  Porokeratosis sub3/4 right foot symptomatic.Marland Kitchen No infection or ulcers  Diagnosis:  Onychomycosis, , Pain in right toe, pain in left toes  Treatment & Plan Procedures and Treatment: Consent by patient was obtained for treatment procedures. The patient understood the discussion of treatment and procedures well. All questions were answered thoroughly reviewed. Debridement of mycotic and hypertrophic toenails, 1 through 5 bilateral and clearing of subungual debris. No ulceration, no infection noted.  Return Visit-Office Procedure: Patient instructed to return to the office for a follow up visit 10 weeks for continued evaluation and treatment.  Gardiner Barefoot DPM

## 2016-05-12 DIAGNOSIS — H40013 Open angle with borderline findings, low risk, bilateral: Secondary | ICD-10-CM | POA: Diagnosis not present

## 2016-06-03 DIAGNOSIS — L738 Other specified follicular disorders: Secondary | ICD-10-CM | POA: Diagnosis not present

## 2016-06-03 DIAGNOSIS — L821 Other seborrheic keratosis: Secondary | ICD-10-CM | POA: Diagnosis not present

## 2016-06-03 DIAGNOSIS — Z85828 Personal history of other malignant neoplasm of skin: Secondary | ICD-10-CM | POA: Diagnosis not present

## 2016-06-03 DIAGNOSIS — L820 Inflamed seborrheic keratosis: Secondary | ICD-10-CM | POA: Diagnosis not present

## 2016-06-03 DIAGNOSIS — L853 Xerosis cutis: Secondary | ICD-10-CM | POA: Diagnosis not present

## 2016-07-01 ENCOUNTER — Encounter: Payer: Self-pay | Admitting: Podiatry

## 2016-07-01 ENCOUNTER — Ambulatory Visit (INDEPENDENT_AMBULATORY_CARE_PROVIDER_SITE_OTHER): Payer: Medicare Other | Admitting: Podiatry

## 2016-07-01 DIAGNOSIS — M79609 Pain in unspecified limb: Secondary | ICD-10-CM | POA: Diagnosis not present

## 2016-07-01 DIAGNOSIS — Q828 Other specified congenital malformations of skin: Secondary | ICD-10-CM

## 2016-07-01 DIAGNOSIS — B351 Tinea unguium: Secondary | ICD-10-CM

## 2016-07-01 NOTE — Progress Notes (Signed)
Patient ID: Brianna Mack, female   DOB: October 19, 1945, 71 y.o.   MRN: [id number] Complaint:  Visit Type: Patient presents to my office for continued preventative foot care services. Complaint: Patient states" my nails have grown long and thick and become painful to walk and wear shoes" . The patient presents for preventative foot care services. She says her callus has improved and there is no pain.  Podiatric Exam: Vascular: dorsalis pedis and posterior tibial pulses are palpable bilateral. Capillary return is immediate. Temperature gradient is WNL. Skin turgor WNL  Sensorium: Normal Semmes Weinstein monofilament test. Normal tactile sensation bilaterally. Nail Exam: Pt has thick disfigured discolored nails with subungual debris noted bilateral entire nail hallux through fifth toenails Ulcer Exam: There is no evidence of ulcer or pre-ulcerative changes or infection. Orthopedic Exam: Muscle tone and strength are WNL. No limitations in general ROM. No crepitus or effusions noted. Foot type and digits show no abnormalities. Bony prominences are unremarkable. Skin:  Porokeratosis sub3/4 right foot symptomatic.Marland Kitchen No infection or ulcers  Diagnosis:  Onychomycosis, , Pain in right toe, pain in left toes,  Porokeratosis  Right foot  Treatment & Plan Procedures and Treatment: Consent by patient was obtained for treatment procedures. The patient understood the discussion of treatment and procedures well. All questions were answered thoroughly reviewed. Debridement of mycotic and hypertrophic toenails, 1 through 5 bilateral and clearing of subungual debris. No ulceration, no infection noted. Debride porokeratosis right foot.   Return Visit-Office Procedure: Patient instructed to return to the office for a follow up visit 10 weeks for continued evaluation and treatment.  Gardiner Barefoot DPM

## 2016-07-02 DIAGNOSIS — M7712 Lateral epicondylitis, left elbow: Secondary | ICD-10-CM | POA: Diagnosis not present

## 2016-07-02 DIAGNOSIS — M25572 Pain in left ankle and joints of left foot: Secondary | ICD-10-CM | POA: Diagnosis not present

## 2016-07-02 DIAGNOSIS — M25522 Pain in left elbow: Secondary | ICD-10-CM | POA: Diagnosis not present

## 2016-07-15 ENCOUNTER — Ambulatory Visit: Payer: Medicare Other | Admitting: Podiatry

## 2016-07-23 ENCOUNTER — Ambulatory Visit (INDEPENDENT_AMBULATORY_CARE_PROVIDER_SITE_OTHER): Payer: Medicare Other | Admitting: Family Medicine

## 2016-07-23 ENCOUNTER — Encounter: Payer: Self-pay | Admitting: Family Medicine

## 2016-07-23 VITALS — BP 102/80 | HR 59 | Temp 98.2°F | Ht 62.5 in | Wt 177.5 lb

## 2016-07-23 DIAGNOSIS — L219 Seborrheic dermatitis, unspecified: Secondary | ICD-10-CM

## 2016-07-23 DIAGNOSIS — M7712 Lateral epicondylitis, left elbow: Secondary | ICD-10-CM | POA: Diagnosis not present

## 2016-07-23 DIAGNOSIS — M25572 Pain in left ankle and joints of left foot: Secondary | ICD-10-CM | POA: Diagnosis not present

## 2016-07-23 NOTE — Progress Notes (Signed)
HPI:  Acute visit for itchy scalp: -for a few weeks -has had this in the past as well, usually benadryl helps -she only washes hair with hairdresser and he suggested tea tree shampoo for this -no fevers, malaise, chills, lice exposure  ROS: See pertinent positives and negatives per HPI.  Past Medical History:  Diagnosis Date  . Anxiety   . Arthritis   . Depression   . HSV (herpes simplex virus) anogenital infection 11/2015  . IBS (irritable bowel syndrome)     Past Surgical History:  Procedure Laterality Date  . ABDOMINAL HYSTERECTOMY  2001   BSO ; SLING PROCEDURE endometriosis/leiomyomata  . CHOLECYSTECTOMY  1976  . DILATION AND CURETTAGE OF UTERUS    . HAND SURGERY  04-02-2011   right  . OOPHORECTOMY     BSO  . ORIF SHOULDER FRACTURE Right 06/28/2014   Procedure: RIGHT SHOULDER HEMI-ARTHROPLASTY;  Surgeon: Netta Cedars, MD;  Location: [facility];  Service: Orthopedics;  Laterality: Right;  . TONSILLECTOMY  1954    Family History  Problem Relation Age of Onset  . Diabetes Mother   . Hypertension Mother   . Cancer Mother        Laurell Roof  . COPD Father     Social History   Social History  . Marital status: Widowed    Spouse name: N/A  . Number of children: N/A  . Years of education: N/A   Social History Main Topics  . Smoking status: Former Smoker    Quit date: 08/03/1977  . Smokeless tobacco: Never Used  . Alcohol use 0.0 oz/week     Comment: occasionally  . Drug use: No  . Sexual activity: Not Currently     Comment: 1st intercourse 71 yo-Fewer than 5 partners   Other Topics Concern  . None   Social History Narrative  . None     Current Outpatient Prescriptions:  .  acetaminophen (TYLENOL) 500 MG tablet, Take 1 tablet (500 mg total) by mouth every 6 (six) hours as needed for mild pain or moderate pain., Disp: 30 tablet, Rfl: 0 .  ALPRAZolam (XANAX) 0.25 MG tablet, Take 1 tablet (0.25 mg total) by mouth at bedtime as needed for anxiety., Disp: 30  tablet, Rfl: 4 .  bacitracin ointment, Apply 1 application topically 2 (two) times daily., Disp: 28 g, Rfl: 1 .  Calcium Carbonate-Vitamin D (CALCIUM + D PO), Take 1 tablet by mouth daily. , Disp: , Rfl:  .  citalopram (CELEXA) 20 MG tablet, Take 1 tablet (20 mg total) by mouth daily., Disp: 30 tablet, Rfl: 12 .  estradiol (ESTRACE) 0.5 MG tablet, Take 1 tablet (0.5 mg total) by mouth daily., Disp: 30 tablet, Rfl: 12 .  hyoscyamine (LEVBID) 0.375 MG 12 hr tablet, Take 0.375 mg by mouth daily. , Disp: , Rfl:  .  Lactobacillus (ACIDOPHILUS PO), Take by mouth daily., Disp: , Rfl:   EXAM:  Vitals:   07/23/16 0934  BP: 102/80  Pulse: (!) 59  Temp: 98.2 F (36.8 C)    Body mass index is 31.95 kg/m.  GENERAL: vitals reviewed and listed above, alert, oriented, appears well hydrated and in no acute distress  HEENT: atraumatic, conjunttiva clear, no obvious abnormalities on inspection of external nose and ears  NECK: no obvious masses on inspection  SKIN: flaky irritated skin on scalp  MS: moves all extremities without noticeable abnormality  PSYCH: pleasant and cooperative, no obvious depression or anxiety  ASSESSMENT AND PLAN:  Discussed the following assessment  and plan:  Seborrheic dermatitis of scalp  -discussed likely etiologies, treatment options -she is going to start with dandruff shampoo from her hairdresser -follow up as needed -Patient advised to return or notify a doctor immediately if symptoms worsen or persist or new concerns arise. Declined AVS.  There are no Patient Instructions on file for this visit.  Colin Benton R., DO

## 2016-09-15 DIAGNOSIS — H524 Presbyopia: Secondary | ICD-10-CM | POA: Diagnosis not present

## 2016-09-15 DIAGNOSIS — H2513 Age-related nuclear cataract, bilateral: Secondary | ICD-10-CM | POA: Diagnosis not present

## 2016-09-15 DIAGNOSIS — H5213 Myopia, bilateral: Secondary | ICD-10-CM | POA: Diagnosis not present

## 2016-09-15 DIAGNOSIS — H52203 Unspecified astigmatism, bilateral: Secondary | ICD-10-CM | POA: Diagnosis not present

## 2016-09-15 DIAGNOSIS — H40002 Preglaucoma, unspecified, left eye: Secondary | ICD-10-CM | POA: Diagnosis not present

## 2016-09-25 ENCOUNTER — Ambulatory Visit: Payer: Medicare Other | Admitting: Podiatry

## 2016-10-01 ENCOUNTER — Ambulatory Visit (INDEPENDENT_AMBULATORY_CARE_PROVIDER_SITE_OTHER): Payer: Medicare Other | Admitting: Podiatry

## 2016-10-01 DIAGNOSIS — M79609 Pain in unspecified limb: Secondary | ICD-10-CM

## 2016-10-01 DIAGNOSIS — B351 Tinea unguium: Secondary | ICD-10-CM

## 2016-10-01 NOTE — Progress Notes (Signed)
Patient ID: Brianna Mack, female   DOB: [birth year], 71 y.o.   MRN: [id number] Complaint:  Visit Type: Patient presents to my office for continued preventative foot care services. Complaint: Patient states" my nails have grown long and thick and become painful to walk and wear shoes" . The patient presents for preventative foot care services. She says her callus has improved and there is no pain.  Podiatric Exam: Vascular: dorsalis pedis and posterior tibial pulses are palpable bilateral. Capillary return is immediate. Temperature gradient is WNL. Skin turgor WNL  Sensorium: Normal Semmes Weinstein monofilament test. Normal tactile sensation bilaterally. Nail Exam: Pt has thick disfigured discolored nails with subungual debris noted bilateral entire nail hallux through fifth toenails Ulcer Exam: There is no evidence of ulcer or pre-ulcerative changes or infection. Orthopedic Exam: Muscle tone and strength are WNL. No limitations in general ROM. No crepitus or effusions noted. Foot type and digits show no abnormalities. Bony prominences are unremarkable. Skin:  Porokeratosis sub3/4 right foot  asymptomatic.Marland Kitchen No infection or ulcers  Diagnosis:  Onychomycosis, , Pain in right toe, pain in left toes  Treatment & Plan Procedures and Treatment: Consent by patient was obtained for treatment procedures. The patient understood the discussion of treatment and procedures well. All questions were answered thoroughly reviewed. Debridement of mycotic and hypertrophic toenails, 1 through 5 bilateral and clearing of subungual debris. No ulceration, no infection noted.  Return Visit-Office Procedure: Patient instructed to return to the office for a follow up visit 10 weeks for continued evaluation and treatment.  Gardiner Barefoot DPM

## 2016-10-02 ENCOUNTER — Ambulatory Visit: Payer: Medicare Other | Admitting: Podiatry

## 2016-11-19 DIAGNOSIS — L820 Inflamed seborrheic keratosis: Secondary | ICD-10-CM | POA: Diagnosis not present

## 2016-11-19 DIAGNOSIS — Z85828 Personal history of other malignant neoplasm of skin: Secondary | ICD-10-CM | POA: Diagnosis not present

## 2016-11-19 DIAGNOSIS — D1801 Hemangioma of skin and subcutaneous tissue: Secondary | ICD-10-CM | POA: Diagnosis not present

## 2016-11-19 DIAGNOSIS — L821 Other seborrheic keratosis: Secondary | ICD-10-CM | POA: Diagnosis not present

## 2016-11-26 ENCOUNTER — Encounter: Payer: Self-pay | Admitting: Gynecology

## 2016-11-26 ENCOUNTER — Ambulatory Visit (INDEPENDENT_AMBULATORY_CARE_PROVIDER_SITE_OTHER): Payer: Medicare Other | Admitting: Gynecology

## 2016-11-26 VITALS — BP 124/76 | Ht 63.0 in | Wt 181.0 lb

## 2016-11-26 DIAGNOSIS — A609 Anogenital herpesviral infection, unspecified: Secondary | ICD-10-CM | POA: Diagnosis not present

## 2016-11-26 DIAGNOSIS — Z79890 Hormone replacement therapy (postmenopausal): Secondary | ICD-10-CM

## 2016-11-26 DIAGNOSIS — N952 Postmenopausal atrophic vaginitis: Secondary | ICD-10-CM

## 2016-11-26 DIAGNOSIS — F341 Dysthymic disorder: Secondary | ICD-10-CM

## 2016-11-26 DIAGNOSIS — F419 Anxiety disorder, unspecified: Secondary | ICD-10-CM

## 2016-11-26 DIAGNOSIS — Z01411 Encounter for gynecological examination (general) (routine) with abnormal findings: Secondary | ICD-10-CM

## 2016-11-26 MED ORDER — ALPRAZOLAM 0.25 MG PO TABS: 0.2500 mg | ORAL_TABLET | Freq: Every evening | ORAL | 4 refills | Status: DC | PRN

## 2016-11-26 MED ORDER — ESTRADIOL 0.5 MG PO TABS: 0.5000 mg | ORAL_TABLET | Freq: Every day | ORAL | 12 refills | Status: DC

## 2016-11-26 MED ORDER — VALACYCLOVIR HCL 500 MG PO TABS: 500.0000 mg | ORAL_TABLET | Freq: Two times a day (BID) | ORAL | 4 refills | Status: DC

## 2016-11-26 MED ORDER — CITALOPRAM HYDROBROMIDE 20 MG PO TABS: 20.0000 mg | ORAL_TABLET | Freq: Every day | ORAL | 12 refills | Status: DC

## 2016-11-26 NOTE — Patient Instructions (Signed)
Follow up for bone density as schedule

## 2016-11-26 NOTE — Progress Notes (Signed)
    Brianna Mack 1945/08/26 [id number]        71 y.o.  G0P0000 for annual gynecologic exam.    Past medical history,surgical history, problem list, medications, allergies, family history and social history were all reviewed and documented as reviewed in the EPIC chart.  ROS:  Performed with pertinent positives and negatives included in the history, assessment and plan.   Additional significant findings :  None   Exam: Caryn Bee assistant Vitals:   11/26/16 1109  BP: 124/76  Weight: 181 lb (82.1 kg)  Height: 5\' 3"  (1.6 m)   Body mass index is 32.06 kg/m.  General appearance:  Normal affect, orientation and appearance. Skin: Grossly normal HEENT: Without gross lesions.  No cervical or supraclavicular adenopathy. Thyroid normal.  Lungs:  Clear without wheezing, rales or rhonchi Cardiac: RR, without RMG Abdominal:  Soft, nontender, without masses, guarding, rebound, organomegaly or hernia Breasts:  Examined lying and sitting without masses, retractions, discharge or axillary adenopathy. Pelvic:  Ext, BUS, Vagina: With atrophic changes  Adnexa: Without masses or tenderness    Anus and perineum: Normal   Rectovaginal: Normal sphincter tone without palpated masses or tenderness.    Assessment/Plan:  71 y.o. G0P0000 female for annual gynecologic exam.  1. Postmenopausal/atrophic genital changes/ERT. Status post TAH/BSO for endometriosis and leiomyoma 2001. On estradiol 0.5 mg. I reviewed the current data on HRT to include the risks versus benefits. Thrombosis such as stroke heart attack DVT and breast cancer issues reviewed. Benefits to include symptom relief and possible cardiovascular benefits and bone health also discussed.  At this point the patient plans to wean this coming winter. If she does well off of ERT that she'll continue. If she has unacceptable symptoms develop and she'll reinitiate. Estradiol 0.5 mg 1 year refill provided. 2. History of HSV diagnosed last year on  culture. Has had 1 outbreak since. Valtrex 500 mg twice a day 5 days with the earliest onset of outbreak. #30 with 4 refills provided. 3. Pap smear 2016. No Pap smear done today. No history of abnormal Pap smears. Per current screening guidelines we both agree to stop screening based on age. 4. DEXA 2016 of the heel. Recommended standard DEXA now and she agrees to schedule. 5. Anxiety/depression. Lost her husband 1 year ago. Continues on Celexa 20 mg daily with occasional use of Xanax 0.25 mg. Refill Celexa 1 year. Xanax #30 with one refill provided. 6. Colonoscopy 2009. Repeat at their recommended interval. 7. Mammography 12/2015. Follow up for mammogram next month. Breast exam normal today. 8. Health maintenance. No routine lab work done as patient does is elsewhere. Follow up 1 year, sooner as needed.   Anastasio Auerbach MD, 12:05 PM 11/26/2016

## 2016-12-15 ENCOUNTER — Ambulatory Visit: Payer: Medicare Other | Admitting: Podiatry

## 2016-12-15 DIAGNOSIS — K589 Irritable bowel syndrome without diarrhea: Secondary | ICD-10-CM | POA: Diagnosis not present

## 2016-12-22 ENCOUNTER — Ambulatory Visit (INDEPENDENT_AMBULATORY_CARE_PROVIDER_SITE_OTHER): Payer: Medicare Other | Admitting: Gynecology

## 2016-12-22 ENCOUNTER — Ambulatory Visit (INDEPENDENT_AMBULATORY_CARE_PROVIDER_SITE_OTHER): Payer: Medicare Other

## 2016-12-22 ENCOUNTER — Other Ambulatory Visit: Payer: Self-pay | Admitting: Gynecology

## 2016-12-22 DIAGNOSIS — M85832 Other specified disorders of bone density and structure, left forearm: Secondary | ICD-10-CM | POA: Diagnosis not present

## 2016-12-22 DIAGNOSIS — Z780 Asymptomatic menopausal state: Secondary | ICD-10-CM | POA: Diagnosis not present

## 2016-12-22 DIAGNOSIS — Z23 Encounter for immunization: Secondary | ICD-10-CM | POA: Diagnosis not present

## 2016-12-22 DIAGNOSIS — Z01411 Encounter for gynecological examination (general) (routine) with abnormal findings: Secondary | ICD-10-CM

## 2016-12-23 ENCOUNTER — Encounter: Payer: Self-pay | Admitting: Gynecology

## 2016-12-30 ENCOUNTER — Encounter: Payer: Self-pay | Admitting: Podiatry

## 2016-12-30 ENCOUNTER — Ambulatory Visit: Payer: Medicare Other | Admitting: Podiatry

## 2016-12-30 DIAGNOSIS — M79609 Pain in unspecified limb: Secondary | ICD-10-CM

## 2016-12-30 DIAGNOSIS — B351 Tinea unguium: Secondary | ICD-10-CM

## 2016-12-30 NOTE — Progress Notes (Signed)
Patient ID: Brianna Mack, female   DOB: 01-04-1946, 71 y.o.   MRN: [id number] Complaint:  Visit Type: Patient presents to my office for continued preventative foot care services. Complaint: Patient states" my nails have grown long and thick and become painful to walk and wear shoes" . The patient presents for preventative foot care services. She says her callus has improved and there is no pain.  Podiatric Exam: Vascular: dorsalis pedis and posterior tibial pulses are palpable bilateral. Capillary return is immediate. Temperature gradient is WNL. Skin turgor WNL  Sensorium: Normal Semmes Weinstein monofilament test. Normal tactile sensation bilaterally. Nail Exam: Pt has thick disfigured discolored nails with subungual debris noted bilateral entire nail hallux through fifth toenails Ulcer Exam: There is no evidence of ulcer or pre-ulcerative changes or infection. Orthopedic Exam: Muscle tone and strength are WNL. No limitations in general ROM. No crepitus or effusions noted. Foot type and digits show no abnormalities. Bony prominences are unremarkable. Skin:  Porokeratosis sub3/4 right foot  asymptomatic.Marland Kitchen No infection or ulcers  Diagnosis:  Onychomycosis, , Pain in right toe, pain in left toes  Treatment & Plan Procedures and Treatment: Consent by patient was obtained for treatment procedures. The patient understood the discussion of treatment and procedures well. All questions were answered thoroughly reviewed. Debridement of mycotic and hypertrophic toenails, 1 through 5 bilateral and clearing of subungual debris. No ulceration, no infection noted.  Return Visit-Office Procedure: Patient instructed to return to the office for a follow up visit 12 weeks for continued evaluation and treatment.  Gardiner Barefoot DPM

## 2017-01-14 ENCOUNTER — Encounter: Payer: Self-pay | Admitting: Family Medicine

## 2017-01-14 ENCOUNTER — Ambulatory Visit: Payer: Medicare Other | Admitting: Family Medicine

## 2017-01-14 ENCOUNTER — Telehealth: Payer: Self-pay | Admitting: Family Medicine

## 2017-01-14 ENCOUNTER — Other Ambulatory Visit: Payer: Self-pay | Admitting: Internal Medicine

## 2017-01-14 VITALS — BP 108/62 | HR 68 | Temp 98.5°F | Ht 63.0 in | Wt 181.3 lb

## 2017-01-14 DIAGNOSIS — J069 Acute upper respiratory infection, unspecified: Secondary | ICD-10-CM | POA: Diagnosis not present

## 2017-01-14 MED ORDER — HYDROCODONE-HOMATROPINE 5-1.5 MG/5ML PO SYRP: 5.0000 mL | ORAL_SOLUTION | Freq: Four times a day (QID) | ORAL | 0 refills | Status: DC | PRN

## 2017-01-14 MED ORDER — BENZONATATE 100 MG PO CAPS: 100.0000 mg | ORAL_CAPSULE | Freq: Two times a day (BID) | ORAL | 0 refills | Status: DC | PRN

## 2017-01-14 NOTE — Telephone Encounter (Signed)
Will forward to PCP. We discussed options for cough at her appt and I advised against this at her appt given her allergies and other medications. It is not something I typically prescribe for the symptoms she was having. She requested tessalon at the appt. Thanks for your input [place].

## 2017-01-14 NOTE — Telephone Encounter (Signed)
Notified patient that Dr Inda Merlin agrees with Dr. Maudie Mercury that Brianna Mack would be a better choice initially.  Suggested she add Mucinex DM twice daily and encouraged liberal fluid intake.  Hydrocodone cough medicine can be considered if cough is refractory and interfering with sleep since she has had this medication and tolerated it well in the past. Patient does have allergies however to other narcotics. Informed patient she can drop by the office for medication tomorrow if cough is refractory to above therapy.  Patient verbalized understanding.

## 2017-01-14 NOTE — Patient Instructions (Signed)
INSTRUCTIONS FOR UPPER RESPIRATORY INFECTION:  -plenty of rest and fluids  -nasal saline wash 2-3 times daily (use prepackaged nasal saline or bottled/distilled water if making your own)   -can use AFRIN nasal spray for drainage and nasal congestion - but do NOT use longer then 3-4 days,  please read all use and contraindications on the packaging before using  -can use tylenol (in no history of liver disease) or ibuprofen (if no history of kidney disease, bowel bleeding or significant heart disease) as directed for aches and sorethroat  -in the winter time, using a humidifier at night is helpful (please follow cleaning instructions)  -if you are taking a cough medication - use only as directed, may also try a teaspoon of honey to coat the throat and throat lozenges.  A humidifier at night can also help this.  Please make sure to follow all cleaning instructions on the humidifier.  -for sore throat, salt water gargles can help  -follow up if you have fevers, facial pain, tooth pain, difficulty breathing or are worsening or symptoms persist longer then expected  Upper Respiratory Infection, Adult An upper respiratory infection (URI) is also known as the common cold. It is often caused by a type of germ (virus). Colds are easily spread (contagious). You can pass it to others by kissing, coughing, sneezing, or drinking out of the same glass. Usually, you get better in 1 to 3  weeks.  However, the cough can last for even longer. HOME CARE   Only take medicine as told by your doctor. Follow instructions provided above.  Drink enough water and fluids to keep your pee (urine) clear or pale yellow.  Get plenty of rest.  Return to work when your temperature is < 100 for 24 hours or as told by your doctor. You may use a face mask and wash your hands to stop your cold from spreading. GET HELP RIGHT AWAY IF:   After the first few days, you feel you are getting worse.  You have questions about  your medicine.  You have chills, shortness of breath, or red spit (mucus).  You have pain in the face for more then 1-2 days, especially when you bend forward.  You have a fever, puffy (swollen) neck, pain when you swallow, or white spots in the back of your throat.  You have a bad headache, ear pain, sinus pain, or chest pain.  You have a high-pitched whistling sound when you breathe in and out (wheezing).  You cough up blood.  You have sore muscles or a stiff neck. MAKE SURE YOU:   Understand these instructions.  Will watch your condition.  Will get help right away if you are not doing well or get worse. Document Released: 07/22/2007 Document Revised: 04/27/2011 Document Reviewed: 05/10/2013 [facility] Patient Information 2015 [place], [place]. This information is not intended to replace advice given to you by your health care provider. Make sure you discuss any questions you have with your health care provider.

## 2017-01-14 NOTE — Progress Notes (Signed)
HPI:  Acute visit for respiratory illness: -started: 4 days ago -symptoms:nasal congestion, sore throat, cough -denies:fever, SOB, NVD, tooth pain, body aches, wheezing  -has tried: Over-the-counter cough medications and Tylenol -sick contacts/travel/risks: no reported flu, strep or tick exposure -Hx of: Denies any history of lung disease ROS: See pertinent positives and negatives per HPI.  Past Medical History:  Diagnosis Date  . Anxiety   . Arthritis   . Depression   . HSV (herpes simplex virus) anogenital infection 11/2015  . IBS (irritable bowel syndrome)     Past Surgical History:  Procedure Laterality Date  . ABDOMINAL HYSTERECTOMY  2001   BSO ; SLING PROCEDURE endometriosis/leiomyomata  . CHOLECYSTECTOMY  1976  . DILATION AND CURETTAGE OF UTERUS    . HAND SURGERY  04-02-2011   right  . OOPHORECTOMY     BSO  . ORIF SHOULDER FRACTURE Right 06/28/2014   Procedure: RIGHT SHOULDER HEMI-ARTHROPLASTY;  Surgeon: Netta Cedars, MD;  Location: [facility];  Service: Orthopedics;  Laterality: Right;  . TONSILLECTOMY  1954    Family History  Problem Relation Age of Onset  . Diabetes Mother   . Hypertension Mother   . Cancer Mother        Laurell Roof  . COPD Father     Social History   Socioeconomic History  . Marital status: Widowed    Spouse name: None  . Number of children: None  . Years of education: None  . Highest education level: None  Social Needs  . Financial resource strain: None  . Food insecurity - worry: None  . Food insecurity - inability: None  . Transportation needs - medical: None  . Transportation needs - non-medical: None  Occupational History  . None  Tobacco Use  . Smoking status: Former Smoker    Last attempt to quit: 08/03/1977    Years since quitting: 39.4  . Smokeless tobacco: Never Used  Substance and Sexual Activity  . Alcohol use: Yes    Alcohol/week: 0.0 oz    Comment: occasionally  . Drug use: No  . Sexual activity: Not  Currently    Comment: 1st intercourse 71 yo-Fewer than 5 partners  Other Topics Concern  . None  Social History Narrative  . None     Current Outpatient Medications:  .  acetaminophen (TYLENOL) 500 MG tablet, Take 1 tablet (500 mg total) by mouth every 6 (six) hours as needed for mild pain or moderate pain., Disp: 30 tablet, Rfl: 0 .  ALPRAZolam (XANAX) 0.25 MG tablet, Take 1 tablet (0.25 mg total) by mouth at bedtime as needed for anxiety., Disp: 30 tablet, Rfl: 4 .  Calcium Carbonate-Vitamin D (CALCIUM + D PO), Take 1 tablet by mouth daily. , Disp: , Rfl:  .  citalopram (CELEXA) 20 MG tablet, Take 1 tablet (20 mg total) by mouth daily., Disp: 30 tablet, Rfl: 12 .  estradiol (ESTRACE) 0.5 MG tablet, Take 1 tablet (0.5 mg total) by mouth daily., Disp: 30 tablet, Rfl: 12 .  hyoscyamine (LEVBID) 0.375 MG 12 hr tablet, Take 0.375 mg by mouth daily. , Disp: , Rfl:  .  Lactobacillus (ACIDOPHILUS PO), Take by mouth daily., Disp: , Rfl:  .  valACYclovir (VALTREX) 500 MG tablet, Take 1 tablet (500 mg total) by mouth 2 (two) times daily., Disp: 30 tablet, Rfl: 4 .  benzonatate (TESSALON) 100 MG capsule, Take 1 capsule (100 mg total) by mouth 2 (two) times daily as needed for cough., Disp: 20 capsule, Rfl: 0  EXAM:  Vitals:   01/14/17 1057  BP: 108/62  Pulse: 68  Temp: 98.5 F (36.9 C)  SpO2: 97%    Body mass index is 32.12 kg/m.  GENERAL: vitals reviewed and listed above, alert, oriented, appears well hydrated and in no acute distress  HEENT: atraumatic, conjunttiva clear, no obvious abnormalities on inspection of external nose and ears, normal appearance of ear canals and TMs, clear nasal congestion, mild post oropharyngeal erythema with PND, no tonsillar edema or exudate, no sinus TTP  NECK: no obvious masses on inspection  LUNGS: clear to auscultation bilaterally, no wheezes, rales or rhonchi, good air movement  CV: HRRR, no peripheral edema  MS: moves all extremities without  noticeable abnormality  PSYCH: pleasant and cooperative, no obvious depression or anxiety  ASSESSMENT AND PLAN:  Discussed the following assessment and plan:  Viral upper respiratory illness  -given HPI and exam findings today, a serious infection or illness is unlikely. We discussed potential etiologies, with VURI being most likely, and advised supportive care and monitoring. We discussed treatment side effects, likely course, antibiotic misuse, transmission, and signs of developing a serious illness. -Tessalon for cough -of course, we advised to return or notify a doctor immediately if symptoms worsen or persist or new concerns arise.    Patient Instructions  INSTRUCTIONS FOR UPPER RESPIRATORY INFECTION:  -plenty of rest and fluids  -nasal saline wash 2-3 times daily (use prepackaged nasal saline or bottled/distilled water if making your own)   -can use AFRIN nasal spray for drainage and nasal congestion - but do NOT use longer then 3-4 days,  please read all use and contraindications on the packaging before using  -can use tylenol (in no history of liver disease) or ibuprofen (if no history of kidney disease, bowel bleeding or significant heart disease) as directed for aches and sorethroat  -in the winter time, using a humidifier at night is helpful (please follow cleaning instructions)  -if you are taking a cough medication - use only as directed, may also try a teaspoon of honey to coat the throat and throat lozenges.  A humidifier at night can also help this.  Please make sure to follow all cleaning instructions on the humidifier.  -for sore throat, salt water gargles can help  -follow up if you have fevers, facial pain, tooth pain, difficulty breathing or are worsening or symptoms persist longer then expected  Upper Respiratory Infection, Adult An upper respiratory infection (URI) is also known as the common cold. It is often caused by a type of germ (virus). Colds are  easily spread (contagious). You can pass it to others by kissing, coughing, sneezing, or drinking out of the same glass. Usually, you get better in 1 to 3  weeks.  However, the cough can last for even longer. HOME CARE   Only take medicine as told by your doctor. Follow instructions provided above.  Drink enough water and fluids to keep your pee (urine) clear or pale yellow.  Get plenty of rest.  Return to work when your temperature is < 100 for 24 hours or as told by your doctor. You may use a face mask and wash your hands to stop your cold from spreading. GET HELP RIGHT AWAY IF:   After the first few days, you feel you are getting worse.  You have questions about your medicine.  You have chills, shortness of breath, or red spit (mucus).  You have pain in the face for more then 1-2 days, especially  when you bend forward.  You have a fever, puffy (swollen) neck, pain when you swallow, or white spots in the back of your throat.  You have a bad headache, ear pain, sinus pain, or chest pain.  You have a high-pitched whistling sound when you breathe in and out (wheezing).  You cough up blood.  You have sore muscles or a stiff neck. MAKE SURE YOU:   Understand these instructions.  Will watch your condition.  Will get help right away if you are not doing well or get worse. Document Released: 07/22/2007 Document Revised: 04/27/2011 Document Reviewed: 05/10/2013 [facility] Patient Information 2015 [place], [place]. This information is not intended to replace advice given to you by your health care provider. Make sure you discuss any questions you have with your health care provider.    Colin Benton R., DO

## 2017-01-14 NOTE — Telephone Encounter (Signed)
Notify patient that I agree with Dr. Maudie Mercury that Brianna Mack would be a better choice initially.  Suggest she add Mucinex DM twice daily and encourage liberal fluid intake. Hydrocodone cough medicine can be considered if cough is refractory and interfering with sleep since she has had this medication and tolerated it well in the past.  She does have allergies however to other narcotics. Patient can drop by the office for medication tomorrow if cough is refractory to above therapy

## 2017-01-14 NOTE — Telephone Encounter (Signed)
Copied from [place]. Topic: General - Other >> Jan 14, 2017 11:51 AM Carolyn Stare wrote: Reason for CRM:   Pt saw Dr Maudie Mercury today and the following rx was called in for her benzonatate (TESSALON) 100 MG capsule. She called to say she had taken hydrocodone cough syrup and said it work well for her and is asking if Dr Maudie Mercury or Dr Raliegh Ip will rx that for her would like a call back

## 2017-01-27 DIAGNOSIS — K1370 Unspecified lesions of oral mucosa: Secondary | ICD-10-CM | POA: Diagnosis not present

## 2017-01-29 ENCOUNTER — Encounter: Payer: Self-pay | Admitting: Internal Medicine

## 2017-01-29 ENCOUNTER — Ambulatory Visit: Payer: Medicare Other | Admitting: Internal Medicine

## 2017-01-29 VITALS — BP 138/74 | HR 67 | Temp 98.1°F | Ht 63.0 in | Wt 183.0 lb

## 2017-01-29 DIAGNOSIS — B9789 Other viral agents as the cause of diseases classified elsewhere: Secondary | ICD-10-CM

## 2017-01-29 DIAGNOSIS — J069 Acute upper respiratory infection, unspecified: Secondary | ICD-10-CM | POA: Diagnosis not present

## 2017-01-29 MED ORDER — HYDROCODONE-HOMATROPINE 5-1.5 MG/5ML PO SYRP: 5.0000 mL | ORAL_SOLUTION | Freq: Four times a day (QID) | ORAL | 0 refills | Status: DC | PRN

## 2017-01-29 NOTE — Progress Notes (Signed)
Subjective:    Patient ID: Brianna Mack, female    DOB: 11-16-1945, 71 y.o.   MRN: [id number]  HPI  71 year old patient who was seen 2 weeks ago with a viral upper respiratory tract infection.  She was treated symptomatically.  She was treated initially with Tessalon but this was not effective and did well on hydrocodone based cough elixir.  She does have a prior allergic history to multiple narcotics. She improved but had recurrence of symptoms 2 days ago with increasing chest congestion and cough associated with headache.  There is been no fever.  Past Medical History:  Diagnosis Date  . Anxiety   . Arthritis   . Depression   . HSV (herpes simplex virus) anogenital infection 11/2015  . IBS (irritable bowel syndrome)      Social History   Socioeconomic History  . Marital status: Widowed    Spouse name: Not on file  . Number of children: Not on file  . Years of education: Not on file  . Highest education level: Not on file  Social Needs  . Financial resource strain: Not on file  . Food insecurity - worry: Not on file  . Food insecurity - inability: Not on file  . Transportation needs - medical: Not on file  . Transportation needs - non-medical: Not on file  Occupational History  . Not on file  Tobacco Use  . Smoking status: Former Smoker    Last attempt to quit: 08/03/1977    Years since quitting: 39.5  . Smokeless tobacco: Never Used  Substance and Sexual Activity  . Alcohol use: Yes    Alcohol/week: 0.0 oz    Comment: occasionally  . Drug use: No  . Sexual activity: Not Currently    Comment: 1st intercourse 71 yo-Fewer than 5 partners  Other Topics Concern  . Not on file  Social History Narrative  . Not on file    Past Surgical History:  Procedure Laterality Date  . ABDOMINAL HYSTERECTOMY  2001   BSO ; SLING PROCEDURE endometriosis/leiomyomata  . CHOLECYSTECTOMY  1976  . DILATION AND CURETTAGE OF UTERUS    . HAND SURGERY  04-02-2011   right  .  OOPHORECTOMY     BSO  . ORIF SHOULDER FRACTURE Right 06/28/2014   Procedure: RIGHT SHOULDER HEMI-ARTHROPLASTY;  Surgeon: Netta Cedars, MD;  Location: [facility];  Service: Orthopedics;  Laterality: Right;  . TONSILLECTOMY  1954    Family History  Problem Relation Age of Onset  . Diabetes Mother   . Hypertension Mother   . Cancer Mother        Laurell Roof  . COPD Father     Allergies  Allergen Reactions  . Augmentin [Amoxicillin-Pot Clavulanate] Itching and Swelling    Headache  . Cephalexin Diarrhea  . Codeine Sulfate     REACTION: nausea, vomiting  . Flexeril [Cyclobenzaprine]     Per patient makes her "very groggy"-unlike usual effect  . Fluconazole Itching and Swelling    REACTION: swelling and itching  . Morphine     REACTION: nausea, itching  . Oxycodone Nausea Only  . Sulfonamide Derivatives     REACTION: rash    Current Outpatient Medications on File Prior to Visit  Medication Sig Dispense Refill  . acetaminophen (TYLENOL) 500 MG tablet Take 1 tablet (500 mg total) by mouth every 6 (six) hours as needed for mild pain or moderate pain. 30 tablet 0  . ALPRAZolam (XANAX) 0.25 MG tablet Take 1 tablet (0.25  mg total) by mouth at bedtime as needed for anxiety. 30 tablet 4  . Calcium Carbonate-Vitamin D (CALCIUM + D PO) Take 1 tablet by mouth daily.     . citalopram (CELEXA) 20 MG tablet Take 1 tablet (20 mg total) by mouth daily. 30 tablet 12  . estradiol (ESTRACE) 0.5 MG tablet Take 1 tablet (0.5 mg total) by mouth daily. 30 tablet 12  . HYDROcodone-homatropine (HYCODAN) 5-1.5 MG/5ML syrup Take 5 mLs by mouth every 6 (six) hours as needed for cough. 120 mL 0  . hyoscyamine (LEVBID) 0.375 MG 12 hr tablet Take 0.375 mg by mouth daily.     . Lactobacillus (ACIDOPHILUS PO) Take by mouth daily.    . valACYclovir (VALTREX) 500 MG tablet Take 1 tablet (500 mg total) by mouth 2 (two) times daily. 30 tablet 4  . benzonatate (TESSALON) 100 MG capsule Take 1 capsule (100 mg total) by  mouth 2 (two) times daily as needed for cough. (Patient not taking: Reported on 01/29/2017) 20 capsule 0   No current facility-administered medications on file prior to visit.     BP 138/74 (BP Location: Left Arm, Patient Position: Sitting, Cuff Size: Normal)   Pulse 67   Temp 98.1 F (36.7 C) (Oral)   Ht 5\' 3"  (1.6 m)   Wt 183 lb (83 kg)   SpO2 97%   BMI 32.42 kg/m     Review of Systems  Constitutional: Positive for activity change, appetite change and fatigue.  HENT: Negative for congestion, dental problem, hearing loss, rhinorrhea, sinus pressure, sore throat and tinnitus.   Eyes: Negative for pain, discharge and visual disturbance.  Respiratory: Positive for cough. Negative for shortness of breath.   Cardiovascular: Negative for chest pain, palpitations and leg swelling.  Gastrointestinal: Negative for abdominal distention, abdominal pain, blood in stool, constipation, diarrhea, nausea and vomiting.  Genitourinary: Negative for difficulty urinating, dysuria, flank pain, frequency, hematuria, pelvic pain, urgency, vaginal bleeding, vaginal discharge and vaginal pain.  Musculoskeletal: Negative for arthralgias, gait problem and joint swelling.  Skin: Negative for rash.  Neurological: Positive for headaches. Negative for dizziness, syncope, speech difficulty, weakness and numbness.  Hematological: Negative for adenopathy.  Psychiatric/Behavioral: Negative for agitation, behavioral problems and dysphoric mood. The patient is not nervous/anxious.        Objective:   Physical Exam  Constitutional: She is oriented to person, place, and time. She appears well-developed and well-nourished.  HENT:  Head: Normocephalic.  Right Ear: External ear normal.  Left Ear: External ear normal.  Mild erythema of the oropharynx  Eyes: Conjunctivae and EOM are normal. Pupils are equal, round, and reactive to light.  Neck: Normal range of motion. Neck supple. No thyromegaly present.    Cardiovascular: Normal rate, regular rhythm, normal heart sounds and intact distal pulses.  Pulmonary/Chest: Effort normal and breath sounds normal. No respiratory distress. She has no wheezes. She has no rales.  Abdominal: Soft. Bowel sounds are normal. She exhibits no mass. There is no tenderness.  Musculoskeletal: Normal range of motion.  Lymphadenopathy:    She has no cervical adenopathy.  Neurological: She is alert and oriented to person, place, and time.  Skin: Skin is warm and dry. No rash noted.  Psychiatric: She has a normal mood and affect. Her behavior is normal.          Assessment & Plan:   Viral URI with cough.  Will treat symptomatically.  Refill on Hydromet dispensed She will call if unimproved Encourage liberal fluid intake  Nyoka Cowden

## 2017-01-29 NOTE — Patient Instructions (Addendum)
Acute bronchitis symptoms for less than 10 days are generally not helped by antibiotics.  Take over-the-counter expectorants and cough medications such as  Mucinex DM.  Call if there is no improvement in 5 to 7 days or if  you develop worsening cough, fever, or new symptoms, such as shortness of breath or chest pain.  Hydrate and Humidify  Drink enough water to keep your urine clear or pale yellow. Staying hydrated will help to thin your mucus.  Use a cool mist humidifier to keep the humidity level in your home above 50%.  Inhale steam for 10-15 minutes, 3-4 times a day or as told by your health care provider. You can do this in the bathroom while a hot shower is running.  Limit your exposure to cool or dry air. Rest  Rest as much as possible.   Take 400-600 mg of ibuprofen ( Advil, Motrin) with food every 4 to 6 hours as needed for pain relief or control of fever

## 2017-01-30 IMAGING — CR DG SHOULDER 2+V*R*
2 series · 2 of 2 positions shown · non-contrast
Comparison: None.

CLINICAL DATA: Tripped and fell wall walking dog. Right shoulder
injury and pain. Initial encounter.

EXAM:
RIGHT SHOULDER - 2+ VIEW

[x shoulder ap right (1 of 2)]
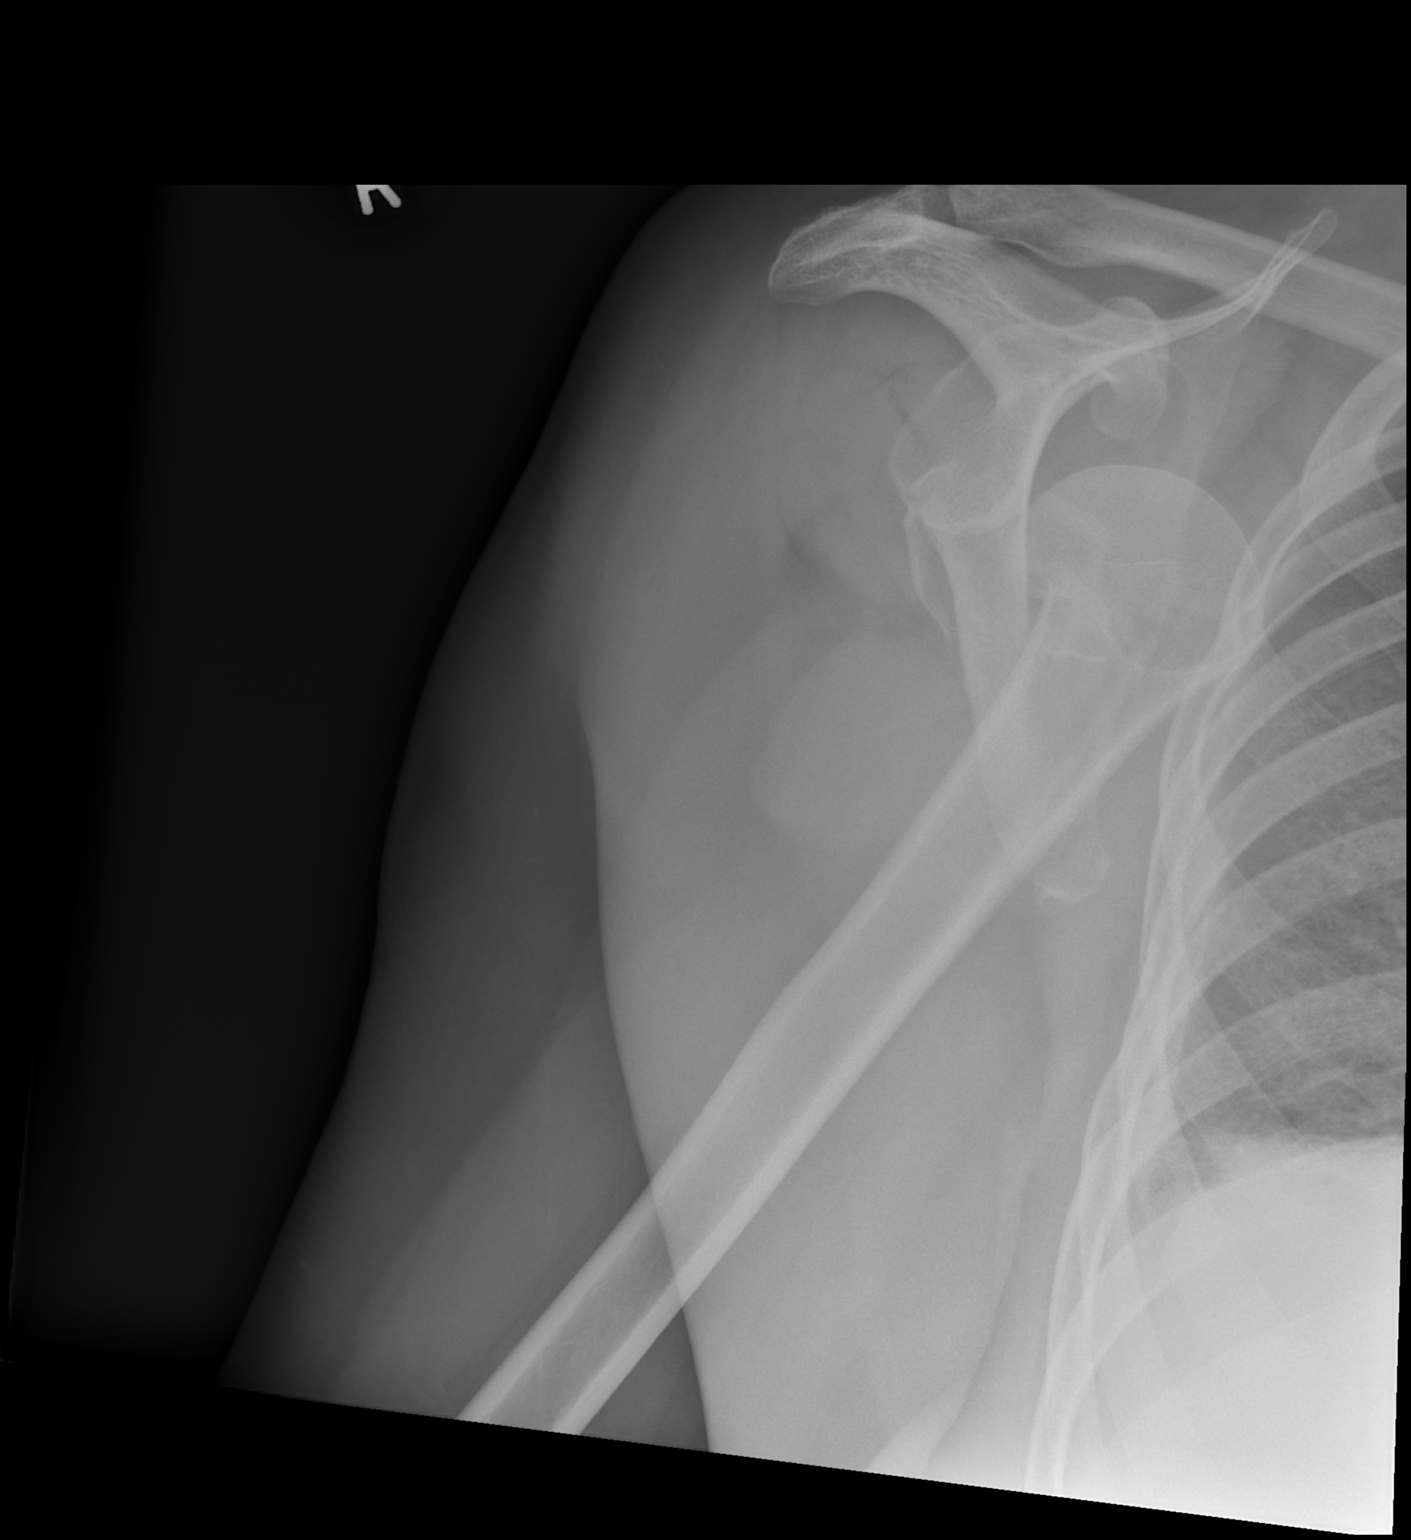

[x shoulder ap right (2 of 2)]
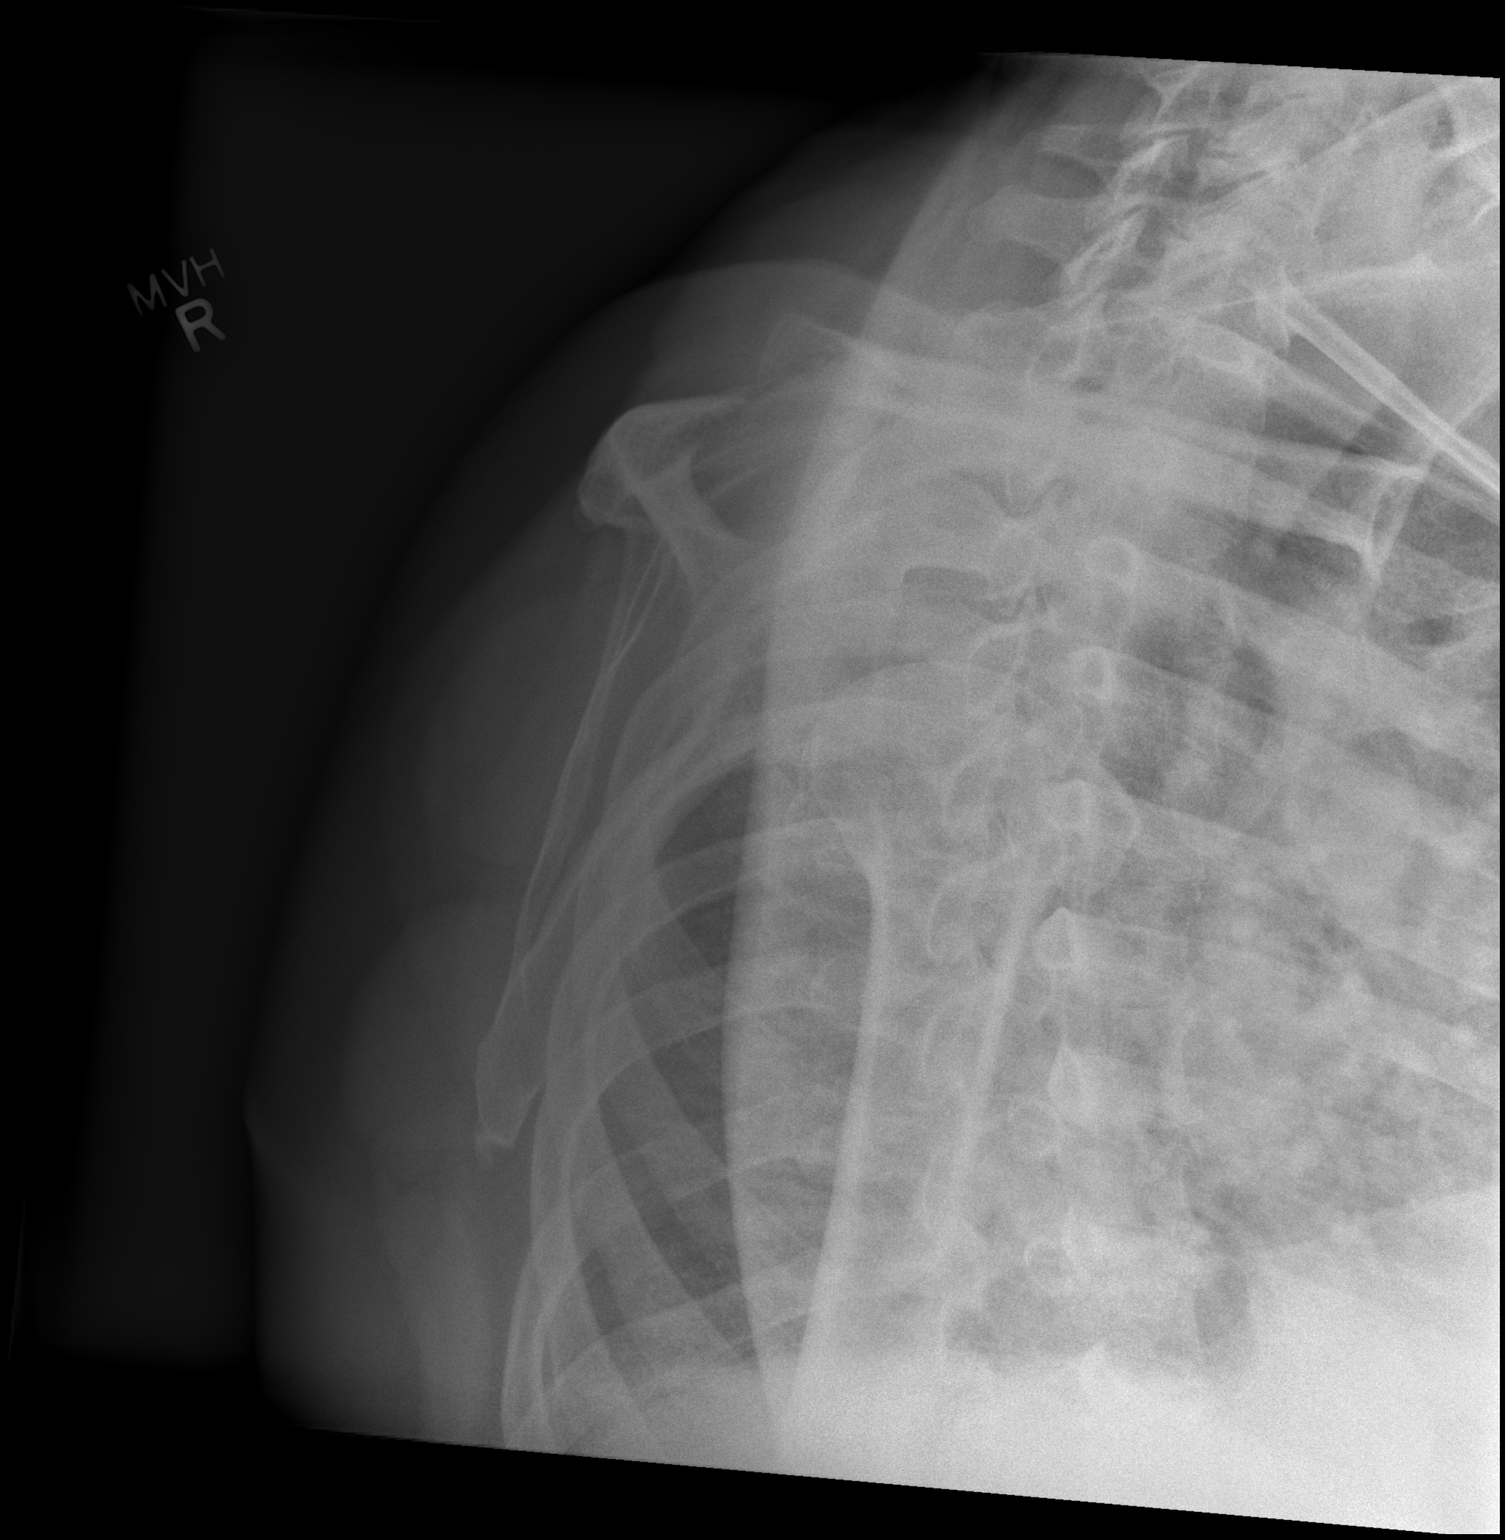

[2 of 2 positions shown; findings below may reference images not displayed]

FINDINGS: Anterior dislocation of the humeral head is seen. There is also a
fracture deformity involving the humeral neck, and another displaced
fracture fragment along the inferior aspect of the glenoid which
likely originates from the right humeral head.
IMPRESSION: Anterior dislocation of the right shoulder, with right humeral neck
fracture.

## 2017-02-04 DIAGNOSIS — K1329 Other disturbances of oral epithelium, including tongue: Secondary | ICD-10-CM | POA: Diagnosis not present

## 2017-02-23 DIAGNOSIS — H40013 Open angle with borderline findings, low risk, bilateral: Secondary | ICD-10-CM | POA: Diagnosis not present

## 2017-03-31 ENCOUNTER — Ambulatory Visit: Payer: Medicare Other | Admitting: Podiatry

## 2017-03-31 ENCOUNTER — Encounter: Payer: Self-pay | Admitting: Podiatry

## 2017-03-31 DIAGNOSIS — M79676 Pain in unspecified toe(s): Secondary | ICD-10-CM | POA: Diagnosis not present

## 2017-03-31 DIAGNOSIS — M79609 Pain in unspecified limb: Principal | ICD-10-CM

## 2017-03-31 DIAGNOSIS — B351 Tinea unguium: Secondary | ICD-10-CM | POA: Diagnosis not present

## 2017-03-31 DIAGNOSIS — Q828 Other specified congenital malformations of skin: Secondary | ICD-10-CM

## 2017-03-31 NOTE — Progress Notes (Signed)
Patient ID: Brianna Mack, female   DOB: May 18, 1945, 72 y.o.   MRN: [id number] Complaint:  Visit Type: Patient presents to my office for continued preventative foot care services. Complaint: Patient states" my nails have grown long and thick and become painful to walk and wear shoes" . The patient presents for preventative foot care services. She says her callus has improved and there is no pain.  Podiatric Exam: Vascular: dorsalis pedis and posterior tibial pulses are palpable bilateral. Capillary return is immediate. Temperature gradient is WNL. Skin turgor WNL  Sensorium: Normal Semmes Weinstein monofilament test. Normal tactile sensation bilaterally. Nail Exam: Pt has thick disfigured discolored nails with subungual debris noted bilateral entire nail hallux through fifth toenails Ulcer Exam: There is no evidence of ulcer or pre-ulcerative changes or infection. Orthopedic Exam: Muscle tone and strength are WNL. No limitations in general ROM. No crepitus or effusions noted. Foot type and digits show no abnormalities. Bony prominences are unremarkable. Skin:  Porokeratosis sub3/4 right foot  asymptomatic.Marland Kitchen No infection or ulcers  Diagnosis:  Onychomycosis, , Pain in right toe, pain in left toes  Treatment & Plan Procedures and Treatment: Consent by patient was obtained for treatment procedures. The patient understood the discussion of treatment and procedures well. All questions were answered thoroughly reviewed. Debridement of mycotic and hypertrophic toenails, 1 through 5 bilateral and clearing of subungual debris. No ulceration, no infection noted.  Return Visit-Office Procedure: Patient instructed to return to the office for a follow up visit 12 weeks for continued evaluation and treatment.  Gardiner Barefoot DPM

## 2017-04-20 ENCOUNTER — Encounter: Payer: Self-pay | Admitting: Internal Medicine

## 2017-04-20 DIAGNOSIS — Z1211 Encounter for screening for malignant neoplasm of colon: Secondary | ICD-10-CM | POA: Diagnosis not present

## 2017-05-14 DIAGNOSIS — L918 Other hypertrophic disorders of the skin: Secondary | ICD-10-CM | POA: Diagnosis not present

## 2017-05-14 DIAGNOSIS — L820 Inflamed seborrheic keratosis: Secondary | ICD-10-CM | POA: Diagnosis not present

## 2017-06-30 ENCOUNTER — Encounter: Payer: Self-pay | Admitting: Podiatry

## 2017-06-30 ENCOUNTER — Ambulatory Visit: Payer: Medicare Other | Admitting: Podiatry

## 2017-06-30 DIAGNOSIS — M79609 Pain in unspecified limb: Principal | ICD-10-CM

## 2017-06-30 DIAGNOSIS — Q828 Other specified congenital malformations of skin: Secondary | ICD-10-CM

## 2017-06-30 DIAGNOSIS — M79676 Pain in unspecified toe(s): Secondary | ICD-10-CM

## 2017-06-30 DIAGNOSIS — B351 Tinea unguium: Secondary | ICD-10-CM | POA: Diagnosis not present

## 2017-06-30 NOTE — Progress Notes (Signed)
Patient ID: Brianna Mack, female   DOB: March 05, 1945, 72 y.o.   MRN: [id number] Complaint:  Visit Type: Patient presents to my office for continued preventative foot care services. Complaint: Patient states" my nails have grown long and thick and become painful to walk and wear shoes" . The patient presents for preventative foot care services. She says her callus has improved and there is no pain.  Podiatric Exam: Vascular: dorsalis pedis and posterior tibial pulses are palpable bilateral. Capillary return is immediate. Temperature gradient is WNL. Skin turgor WNL  Sensorium: Normal Semmes Weinstein monofilament test. Normal tactile sensation bilaterally. Nail Exam: Pt has thick disfigured discolored nails with subungual debris noted bilateral entire nail hallux through fifth toenails Ulcer Exam: There is no evidence of ulcer or pre-ulcerative changes or infection. Orthopedic Exam: Muscle tone and strength are WNL. No limitations in general ROM. No crepitus or effusions noted. Foot type and digits show no abnormalities. Bony prominences are unremarkable. Skin:  Porokeratosis sub3/4 right foot  Asymptomatic.Marland Kitchen Porokeratotic lesion left arch. No infection or ulcers  Diagnosis:  Onychomycosis, , Pain in right toe, pain in left toes Debride porokeratosis left  Treatment & Plan Procedures and Treatment: Consent by patient was obtained for treatment procedures. The patient understood the discussion of treatment and procedures well. All questions were answered thoroughly reviewed. Debridement of mycotic and hypertrophic toenails, 1 through 5 bilateral and clearing of subungual debris. No ulceration, no infection noted.  Return Visit-Office Procedure: Patient instructed to return to the office for a follow up visit 12 weeks for continued evaluation and treatment.  Gardiner Barefoot DPM

## 2017-07-06 ENCOUNTER — Ambulatory Visit (INDEPENDENT_AMBULATORY_CARE_PROVIDER_SITE_OTHER): Payer: Medicare Other | Admitting: Internal Medicine

## 2017-07-06 ENCOUNTER — Encounter: Payer: Self-pay | Admitting: Internal Medicine

## 2017-07-06 VITALS — BP 110/78 | HR 75 | Temp 98.1°F | Ht 63.0 in | Wt 181.0 lb

## 2017-07-06 DIAGNOSIS — N951 Menopausal and female climacteric states: Secondary | ICD-10-CM

## 2017-07-06 DIAGNOSIS — Z0000 Encounter for general adult medical examination without abnormal findings: Secondary | ICD-10-CM | POA: Diagnosis not present

## 2017-07-06 DIAGNOSIS — K588 Other irritable bowel syndrome: Secondary | ICD-10-CM | POA: Diagnosis not present

## 2017-07-06 DIAGNOSIS — E785 Hyperlipidemia, unspecified: Secondary | ICD-10-CM | POA: Diagnosis not present

## 2017-07-06 LAB — CBC WITH DIFFERENTIAL/PLATELET
Basophils Absolute: 0.1 10*3/uL (ref 0.0–0.1)
Basophils Relative: 1.3 % (ref 0.0–3.0)
Eosinophils Absolute: 0.1 10*3/uL (ref 0.0–0.7)
Eosinophils Relative: 1.9 % (ref 0.0–5.0)
HCT: 40.8 % (ref 36.0–46.0)
Hemoglobin: 13.5 g/dL (ref 12.0–15.0)
LYMPHS ABS: 2.1 10*3/uL (ref 0.7–4.0)
Lymphocytes Relative: 28.3 % (ref 12.0–46.0)
MCHC: 33 g/dL (ref 30.0–36.0)
MCV: 89.9 fl (ref 78.0–100.0)
MONOS PCT: 7.8 % (ref 3.0–12.0)
Monocytes Absolute: 0.6 10*3/uL (ref 0.1–1.0)
NEUTROS ABS: 4.6 10*3/uL (ref 1.4–7.7)
NEUTROS PCT: 60.7 % (ref 43.0–77.0)
Platelets: 314 10*3/uL (ref 150.0–400.0)
RBC: 4.54 Mil/uL (ref 3.87–5.11)
RDW: 14.4 % (ref 11.5–15.5)
WBC: 7.6 10*3/uL (ref 4.0–10.5)

## 2017-07-06 LAB — COMPREHENSIVE METABOLIC PANEL
ALK PHOS: 79 U/L (ref 39–117)
ALT: 9 U/L (ref 0–35)
AST: 14 U/L (ref 0–37)
Albumin: 4 g/dL (ref 3.5–5.2)
BUN: 14 mg/dL (ref 6–23)
CHLORIDE: 101 meq/L (ref 96–112)
CO2: 27 meq/L (ref 19–32)
Calcium: 9 mg/dL (ref 8.4–10.5)
Creatinine, Ser: 0.65 mg/dL (ref 0.40–1.20)
GFR: 95.53 mL/min (ref >60.00)
GLUCOSE: 82 mg/dL (ref 70–99)
POTASSIUM: 4 meq/L (ref 3.5–5.1)
SODIUM: 138 meq/L (ref 135–145)
Total Bilirubin: 0.6 mg/dL (ref 0.2–1.2)
Total Protein: 6.8 g/dL (ref 6.0–8.3)

## 2017-07-06 LAB — LIPID PANEL
CHOL/HDL RATIO: 3
Cholesterol: 221 mg/dL — ABNORMAL HIGH (ref 0–200)
HDL: 76 mg/dL (ref >39.00)
LDL Cholesterol: 127 mg/dL — ABNORMAL HIGH (ref 0–99)
NONHDL: 145.26
Triglycerides: 93 mg/dL (ref 0.0–149.0)
VLDL: 18.6 mg/dL (ref 0.0–40.0)

## 2017-07-06 LAB — TSH: TSH: 4.11 u[IU]/mL (ref 0.35–4.50)

## 2017-07-06 NOTE — Progress Notes (Signed)
Subjective:    Patient ID: Brianna Mack, female    DOB: 27-May-1945, 72 y.o.   MRN: [id number]  HPI 72 year old patient who is seen today for a annual preventive health examination as well as a subsequent Medicare wellness visit She is doing quite well.  She is followed by OB/GYN annually.  She did have a bone density study performed in November of last year.  She has had a recent follow-up colonoscopy.  She is adjusting to the death of her husband a year and a half ago.  She is quite active with family friends and plans to start participating at a health club soon  Past Medical History:  Diagnosis Date  . Anxiety   . Arthritis   . Depression   . HSV (herpes simplex virus) anogenital infection 11/2015  . IBS (irritable bowel syndrome)      Social History   Socioeconomic History  . Marital status: Widowed    Spouse name: Not on file  . Number of children: Not on file  . Years of education: Not on file  . Highest education level: Not on file  Occupational History  . Not on file  Social Needs  . Financial resource strain: Not on file  . Food insecurity:    Worry: Not on file    Inability: Not on file  . Transportation needs:    Medical: Not on file    Non-medical: Not on file  Tobacco Use  . Smoking status: Former Smoker    Last attempt to quit: 08/03/1977    Years since quitting: 39.9  . Smokeless tobacco: Never Used  Substance and Sexual Activity  . Alcohol use: Yes    Alcohol/week: 0.0 oz    Comment: occasionally  . Drug use: No  . Sexual activity: Not Currently    Comment: 1st intercourse 72 yo-Fewer than 5 partners  Lifestyle  . Physical activity:    Days per week: Not on file    Minutes per session: Not on file  . Stress: Not on file  Relationships  . Social connections:    Talks on phone: Not on file    Gets together: Not on file    Attends religious service: Not on file    Active member of club or organization: Not on file    Attends meetings of  clubs or organizations: Not on file    Relationship status: Not on file  . Intimate partner violence:    Fear of current or ex partner: Not on file    Emotionally abused: Not on file    Physically abused: Not on file    Forced sexual activity: Not on file  Other Topics Concern  . Not on file  Social History Narrative  . Not on file    Past Surgical History:  Procedure Laterality Date  . ABDOMINAL HYSTERECTOMY  2001   BSO ; SLING PROCEDURE endometriosis/leiomyomata  . CHOLECYSTECTOMY  1976  . DILATION AND CURETTAGE OF UTERUS    . HAND SURGERY  04-02-2011   right  . OOPHORECTOMY     BSO  . ORIF SHOULDER FRACTURE Right 06/28/2014   Procedure: RIGHT SHOULDER HEMI-ARTHROPLASTY;  Surgeon: Netta Cedars, MD;  Location: [facility];  Service: Orthopedics;  Laterality: Right;  . TONSILLECTOMY  1954    Family History  Problem Relation Age of Onset  . Diabetes Mother   . Hypertension Mother   . Cancer Mother        Laurell Roof  . COPD  Father     Allergies  Allergen Reactions  . Augmentin [Amoxicillin-Pot Clavulanate] Itching and Swelling    Headache  . Cephalexin Diarrhea  . Codeine Sulfate     REACTION: nausea, vomiting  . Flexeril [Cyclobenzaprine]     Per patient makes her "very groggy"-unlike usual effect  . Fluconazole Itching and Swelling    REACTION: swelling and itching  . Morphine     REACTION: nausea, itching  . Oxycodone Nausea Only  . Sulfonamide Derivatives     REACTION: rash  . Amoxicillin Rash    Current Outpatient Medications on File Prior to Visit  Medication Sig Dispense Refill  . acetaminophen (TYLENOL) 500 MG tablet Take 1 tablet (500 mg total) by mouth every 6 (six) hours as needed for mild pain or moderate pain. 30 tablet 0  . ALPRAZolam (XANAX) 0.25 MG tablet Take 1 tablet (0.25 mg total) by mouth at bedtime as needed for anxiety. 30 tablet 4  . benzonatate (TESSALON) 100 MG capsule Take 1 capsule (100 mg total) by mouth 2 (two) times daily as needed  for cough. 20 capsule 0  . Calcium Carbonate-Vitamin D (CALCIUM + D PO) Take 1 tablet by mouth daily.     . citalopram (CELEXA) 20 MG tablet Take 1 tablet (20 mg total) by mouth daily. 30 tablet 12  . dexamethasone 0.5 MG/5ML elixir     . estradiol (ESTRACE) 0.5 MG tablet Take 1 tablet (0.5 mg total) by mouth daily. 30 tablet 12  . hyoscyamine (LEVBID) 0.375 MG 12 hr tablet Take 0.375 mg by mouth daily.     . Lactobacillus (ACIDOPHILUS PO) Take by mouth daily.    . valACYclovir (VALTREX) 500 MG tablet Take 1 tablet (500 mg total) by mouth 2 (two) times daily. 30 tablet 4   No current facility-administered medications on file prior to visit.     BP 110/78   Pulse 75   Temp 98.1 F (36.7 C) (Oral)   Ht 5\' 3"  (1.6 m)   Wt 181 lb (82.1 kg)   SpO2 96%   BMI 32.06 kg/m   Subsequent Medicare wellness visit  1. Risk factors, based on past  M,S,F history.  No cardiovascular risk factors  2.  Physical activities: Fairly sedentary with plans on joining a health club next month.  Does walk her dog daily  3.  Depression/mood: History of situational anxiety and grief reaction adjusting to the death of her husband 1-1/2 years ago  4.  Hearing: No deficits  5.  ADL's: Independent  6.  Fall risk: Low  7.  Home safety: No problems identified  8.  Height weight, and visual acuity; height and weight stable no change in visual acuity  9.  Counseling: Continue counseling with hospice as needed.  Plans to participate at her health club encouraged  10. Lab orders based on risk factors: Laboratory update will be reviewed  11. Referral : Annual gynecologic visits and eye examinations encouraged  12. Care plan: Continue efforts at risk factor modification 13. Cognitive assessment: Alert and oriented with normal affect.  No cognitive dysfunction  14. Screening: Patient provided with a written and personalized 5-10 year screening schedule in the [facility].    15. Provider List Update: Primary care  ophthalmology OB/GYN as well as dermatology            Review of Systems  Constitutional: Negative.   HENT: Negative for congestion, dental problem, hearing loss, rhinorrhea, sinus pressure, sore throat and tinnitus.  Has been followed recently by a periodontist due to some gingival inflammation.  Some concern about a autoimmune disorder  Eyes: Negative for pain, discharge and visual disturbance.  Respiratory: Negative for cough and shortness of breath.   Cardiovascular: Negative for chest pain, palpitations and leg swelling.  Gastrointestinal: Negative for abdominal distention, abdominal pain, blood in stool, constipation, diarrhea, nausea and vomiting.  Genitourinary: Negative for difficulty urinating, dysuria, flank pain, frequency, hematuria, pelvic pain, urgency, vaginal bleeding, vaginal discharge and vaginal pain.  Musculoskeletal: Negative for arthralgias, gait problem and joint swelling.  Skin: Negative for rash.  Neurological: Negative for dizziness, syncope, speech difficulty, weakness, numbness and headaches.  Hematological: Negative for adenopathy.  Psychiatric/Behavioral: Negative for agitation, behavioral problems and dysphoric mood. The patient is not nervous/anxious.        Objective:   Physical Exam  Constitutional: She is oriented to person, place, and time. She appears well-developed and well-nourished.  HENT:  Head: Normocephalic.  Right Ear: External ear normal.  Left Ear: External ear normal.  Mouth/Throat: Oropharynx is clear and moist.  Eyes: Pupils are equal, round, and reactive to light. Conjunctivae and EOM are normal.  Neck: Normal range of motion. Neck supple. No thyromegaly present.  Cardiovascular: Normal rate, regular rhythm, normal heart sounds and intact distal pulses.  Pulmonary/Chest: Effort normal and breath sounds normal.  Abdominal: Soft. Bowel sounds are normal. She exhibits no mass. There is no tenderness.  Musculoskeletal:    Decreased range of motion right shoulder  Lymphadenopathy:    She has no cervical adenopathy.  Neurological: She is alert and oriented to person, place, and time.  Skin: Skin is warm and dry. No rash noted.  Psychiatric: She has a normal mood and affect. Her behavior is normal.          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

## 2017-07-06 NOTE — Patient Instructions (Addendum)
It is important that you exercise regularly, at least 20 minutes 3 to 4 times per week.  If you develop chest pain or shortness of breath seek  medical attention.  You need to lose weight.  Consider a lower calorie diet and regular exercise.   Health Maintenance for Postmenopausal Women Menopause is a normal process in which your reproductive ability comes to an end. This process happens gradually over a span of months to years, usually between the ages of 66 and 49. Menopause is complete when you have missed 12 consecutive menstrual periods. It is important to talk with your health care provider about some of the most common conditions that affect postmenopausal women, such as heart disease, cancer, and bone loss (osteoporosis). Adopting a healthy lifestyle and getting preventive care can help to promote your health and wellness. Those actions can also lower your chances of developing some of these common conditions. What should I know about menopause? During menopause, you may experience a number of symptoms, such as:  Moderate-to-severe hot flashes.  Night sweats.  Decrease in sex drive.  Mood swings.  Headaches.  Tiredness.  Irritability.  Memory problems.  Insomnia.  Choosing to treat or not to treat menopausal changes is an individual decision that you make with your health care provider. What should I know about hormone replacement therapy and supplements? Hormone therapy products are effective for treating symptoms that are associated with menopause, such as hot flashes and night sweats. Hormone replacement carries certain risks, especially as you become older. If you are thinking about using estrogen or estrogen with progestin treatments, discuss the benefits and risks with your health care provider. What should I know about heart disease and stroke? Heart disease, heart attack, and stroke become more likely as you age. This may be due, in part, to the hormonal changes  that your body experiences during menopause. These can affect how your body processes dietary fats, triglycerides, and cholesterol. Heart attack and stroke are both medical emergencies. There are many things that you can do to help prevent heart disease and stroke:  Have your blood pressure checked at least every 1-2 years. High blood pressure causes heart disease and increases the risk of stroke.  If you are 58-36 years old, ask your health care provider if you should take aspirin to prevent a heart attack or a stroke.  Do not use any tobacco products, including cigarettes, chewing tobacco, or electronic cigarettes. If you need help quitting, ask your health care provider.  It is important to eat a healthy diet and maintain a healthy weight. ? Be sure to include plenty of vegetables, fruits, low-fat dairy products, and lean protein. ? Avoid eating foods that are high in solid fats, added sugars, or salt (sodium).  Get regular exercise. This is one of the most important things that you can do for your health. ? Try to exercise for at least 150 minutes each week. The type of exercise that you do should increase your heart rate and make you sweat. This is known as moderate-intensity exercise. ? Try to do strengthening exercises at least twice each week. Do these in addition to the moderate-intensity exercise.  Know your numbers.Ask your health care provider to check your cholesterol and your blood glucose. Continue to have your blood tested as directed by your health care provider.  What should I know about cancer screening? There are several types of cancer. Take the following steps to reduce your risk and to catch any cancer  development as early as possible. Breast Cancer  Practice breast self-awareness. ? This means understanding how your breasts normally appear and feel. ? It also means doing regular breast self-exams. Let your health care provider know about any changes, no matter how  small.  If you are 24 or older, have a clinician do a breast exam (clinical breast exam or CBE) every year. Depending on your age, family history, and medical history, it may be recommended that you also have a yearly breast X-ray (mammogram).  If you have a family history of breast cancer, talk with your health care provider about genetic screening.  If you are at high risk for breast cancer, talk with your health care provider about having an MRI and a mammogram every year.  Breast cancer (BRCA) gene test is recommended for women who have family members with BRCA-related cancers. Results of the assessment will determine the need for genetic counseling and BRCA1 and for BRCA2 testing. BRCA-related cancers include these types: ? Breast. This occurs in males or females. ? Ovarian. ? Tubal. This may also be called fallopian tube cancer. ? Cancer of the abdominal or pelvic lining (peritoneal cancer). ? Prostate. ? Pancreatic.  Cervical, Uterine, and Ovarian Cancer Your health care provider may recommend that you be screened regularly for cancer of the pelvic organs. These include your ovaries, uterus, and vagina. This screening involves a pelvic exam, which includes checking for microscopic changes to the surface of your cervix (Pap test).  For women ages 21-65, health care providers may recommend a pelvic exam and a Pap test every three years. For women ages 28-65, they may recommend the Pap test and pelvic exam, combined with testing for human papilloma virus (HPV), every five years. Some types of HPV increase your risk of cervical cancer. Testing for HPV may also be done on women of any age who have unclear Pap test results.  Other health care providers may not recommend any screening for nonpregnant women who are considered low risk for pelvic cancer and have no symptoms. Ask your health care provider if a screening pelvic exam is right for you.  If you have had past treatment for cervical  cancer or a condition that could lead to cancer, you need Pap tests and screening for cancer for at least 20 years after your treatment. If Pap tests have been discontinued for you, your risk factors (such as having a new sexual partner) need to be reassessed to determine if you should start having screenings again. Some women have medical problems that increase the chance of getting cervical cancer. In these cases, your health care provider may recommend that you have screening and Pap tests more often.  If you have a family history of uterine cancer or ovarian cancer, talk with your health care provider about genetic screening.  If you have vaginal bleeding after reaching menopause, tell your health care provider.  There are currently no reliable tests available to screen for ovarian cancer.  Lung Cancer Lung cancer screening is recommended for adults 25-63 years old who are at high risk for lung cancer because of a history of smoking. A yearly low-dose CT scan of the lungs is recommended if you:  Currently smoke.  Have a history of at least 30 pack-years of smoking and you currently smoke or have quit within the past 15 years. A pack-year is smoking an average of one pack of cigarettes per day for one year.  Yearly screening should:  Continue until it  has been 15 years since you quit.  Stop if you develop a health problem that would prevent you from having lung cancer treatment.  Colorectal Cancer  This type of cancer can be detected and can often be prevented.  Routine colorectal cancer screening usually begins at age 39 and continues through age 52.  If you have risk factors for colon cancer, your health care provider may recommend that you be screened at an earlier age.  If you have a family history of colorectal cancer, talk with your health care provider about genetic screening.  Your health care provider may also recommend using home test kits to check for hidden blood in your  stool.  A small camera at the end of a tube can be used to examine your colon directly (sigmoidoscopy or colonoscopy). This is done to check for the earliest forms of colorectal cancer.  Direct examination of the colon should be repeated every 5-10 years until age 15. However, if early forms of precancerous polyps or small growths are found or if you have a family history or genetic risk for colorectal cancer, you may need to be screened more often.  Skin Cancer  Check your skin from head to toe regularly.  Monitor any moles. Be sure to tell your health care provider: ? About any new moles or changes in moles, especially if there is a change in a mole's shape or color. ? If you have a mole that is larger than the size of a pencil eraser.  If any of your family members has a history of skin cancer, especially at a young age, talk with your health care provider about genetic screening.  Always use sunscreen. Apply sunscreen liberally and repeatedly throughout the day.  Whenever you are outside, protect yourself by wearing long sleeves, pants, a wide-brimmed hat, and sunglasses.  What should I know about osteoporosis? Osteoporosis is a condition in which bone destruction happens more quickly than new bone creation. After menopause, you may be at an increased risk for osteoporosis. To help prevent osteoporosis or the bone fractures that can happen because of osteoporosis, the following is recommended:  If you are 73-80 years old, get at least 1,000 mg of calcium and at least 600 mg of vitamin D per day.  If you are older than age 73 but younger than age 54, get at least 1,200 mg of calcium and at least 600 mg of vitamin D per day.  If you are older than age 88, get at least 1,200 mg of calcium and at least 800 mg of vitamin D per day.  Smoking and excessive alcohol intake increase the risk of osteoporosis. Eat foods that are rich in calcium and vitamin D, and do weight-bearing exercises  several times each week as directed by your health care provider. What should I know about how menopause affects my mental health? Depression may occur at any age, but it is more common as you become older. Common symptoms of depression include:  Low or sad mood.  Changes in sleep patterns.  Changes in appetite or eating patterns.  Feeling an overall lack of motivation or enjoyment of activities that you previously enjoyed.  Frequent crying spells.  Talk with your health care provider if you think that you are experiencing depression. What should I know about immunizations? It is important that you get and maintain your immunizations. These include:  Tetanus, diphtheria, and pertussis (Tdap) booster vaccine.  Influenza every year before the flu season begins.  Pneumonia vaccine.  Shingles vaccine.  Your health care provider may also recommend other immunizations. This information is not intended to replace advice given to you by your health care provider. Make sure you discuss any questions you have with your health care provider. Document Released: 03/27/2005 Document Revised: 08/23/2015 Document Reviewed: 11/06/2014 Elsevier Interactive Patient Education  2018 [employer]  

## 2017-07-16 DIAGNOSIS — Z85828 Personal history of other malignant neoplasm of skin: Secondary | ICD-10-CM | POA: Diagnosis not present

## 2017-07-16 DIAGNOSIS — L738 Other specified follicular disorders: Secondary | ICD-10-CM | POA: Diagnosis not present

## 2017-07-16 DIAGNOSIS — L814 Other melanin hyperpigmentation: Secondary | ICD-10-CM | POA: Diagnosis not present

## 2017-07-16 DIAGNOSIS — L820 Inflamed seborrheic keratosis: Secondary | ICD-10-CM | POA: Diagnosis not present

## 2017-07-16 DIAGNOSIS — L821 Other seborrheic keratosis: Secondary | ICD-10-CM | POA: Diagnosis not present

## 2017-08-03 ENCOUNTER — Telehealth: Payer: Self-pay | Admitting: *Deleted

## 2017-08-03 NOTE — Telephone Encounter (Signed)
Copied from [place] [id number]. Topic: Quick Communication - Lab Results >> Aug 03, 2017 12:56 PM Lennox Solders wrote: Pt is calling and would like her blood work results from may 2019

## 2017-08-03 NOTE — Telephone Encounter (Signed)
Please result and advise 

## 2017-08-03 NOTE — Telephone Encounter (Signed)
Please call/notify patient that lab/test/procedure is normal; cholesterol 221 with a very high/favorable HDL cholesterol

## 2017-08-04 NOTE — Telephone Encounter (Signed)
Left detailed message informing pt of update. Crm created!

## 2017-08-24 DIAGNOSIS — H524 Presbyopia: Secondary | ICD-10-CM | POA: Diagnosis not present

## 2017-08-24 DIAGNOSIS — H52203 Unspecified astigmatism, bilateral: Secondary | ICD-10-CM | POA: Diagnosis not present

## 2017-08-24 DIAGNOSIS — H5213 Myopia, bilateral: Secondary | ICD-10-CM | POA: Diagnosis not present

## 2017-08-24 DIAGNOSIS — H2513 Age-related nuclear cataract, bilateral: Secondary | ICD-10-CM | POA: Diagnosis not present

## 2017-08-24 DIAGNOSIS — H40013 Open angle with borderline findings, low risk, bilateral: Secondary | ICD-10-CM | POA: Diagnosis not present

## 2017-09-07 NOTE — Telephone Encounter (Signed)
Pt. returned call for lab results; informed of result note per Dr. Burnice Logan, from 08/03/17.  Verbalized understanding.

## 2017-09-20 ENCOUNTER — Encounter: Payer: Self-pay | Admitting: Internal Medicine

## 2017-09-20 ENCOUNTER — Ambulatory Visit: Payer: Medicare Other | Admitting: Internal Medicine

## 2017-09-20 VITALS — BP 120/70 | HR 70 | Temp 99.6°F | Wt 192.2 lb

## 2017-09-20 DIAGNOSIS — B9789 Other viral agents as the cause of diseases classified elsewhere: Secondary | ICD-10-CM

## 2017-09-20 DIAGNOSIS — J069 Acute upper respiratory infection, unspecified: Secondary | ICD-10-CM

## 2017-09-20 MED ORDER — HYDROCODONE-HOMATROPINE 5-1.5 MG/5ML PO SYRP: 5.0000 mL | ORAL_SOLUTION | Freq: Four times a day (QID) | ORAL | 0 refills | Status: AC | PRN

## 2017-09-20 MED ORDER — BENZONATATE 100 MG PO CAPS: 100.0000 mg | ORAL_CAPSULE | Freq: Two times a day (BID) | ORAL | 0 refills | Status: DC | PRN

## 2017-09-20 NOTE — Patient Instructions (Addendum)
Acute bronchitis symptoms for less than 10 days are generally not helped by antibiotics.  Take over-the-counter expectorants and cough medications such as  Mucinex DM.  Call if there is no improvement in 5 to 7 days or if  you develop worsening cough, fever, or new symptoms, such as shortness of breath or chest pain.  Hydrate and Humidify  Drink enough water to keep your urine clear or pale yellow. Staying hydrated will help to thin your mucus.  Use a cool mist humidifier to keep the humidity level in your home above 50%.  Inhale steam for 10-15 minutes, 3-4 times a day or as told by your health care provider. You can do this in the bathroom while a hot shower is running.  Limit your exposure to cool or dry air. Rest  Rest as much as possible.  Sleep with your head raised (elevated).

## 2017-09-20 NOTE — Progress Notes (Signed)
Subjective:    Patient ID: Brianna Mack, female    DOB: 25-Mar-1945, 72 y.o.   MRN: [id number]  HPI  72 year old patient who presents with a 3-day history of low-grade fever malaise and cough.  Cough is mainly productive of yellow and occasionally pink-tinged sputum.  No chest pain shortness of breath or chills.  She has been using Mucinex DM and Tylenol.  Cough has interfered with sleep which has been greatly helped by hydrocodone-based cough elixir.  She has been using medication left over from bronchitis from late last year.  She has tolerated this without difficulty in spite of sensitivity to codeine and morphine sulfate.  She is requesting a refill of this cough medication  Past Medical History:  Diagnosis Date  . Anxiety   . Arthritis   . Depression   . HSV (herpes simplex virus) anogenital infection 11/2015  . IBS (irritable bowel syndrome)      Social History   Socioeconomic History  . Marital status: Widowed    Spouse name: Not on file  . Number of children: Not on file  . Years of education: Not on file  . Highest education level: Not on file  Occupational History  . Not on file  Social Needs  . Financial resource strain: Not on file  . Food insecurity:    Worry: Not on file    Inability: Not on file  . Transportation needs:    Medical: Not on file    Non-medical: Not on file  Tobacco Use  . Smoking status: Former Smoker    Last attempt to quit: 08/03/1977    Years since quitting: 40.1  . Smokeless tobacco: Never Used  Substance and Sexual Activity  . Alcohol use: Yes    Alcohol/week: 0.0 oz    Comment: occasionally  . Drug use: No  . Sexual activity: Not Currently    Comment: 1st intercourse 72 yo-Fewer than 5 partners  Lifestyle  . Physical activity:    Days per week: Not on file    Minutes per session: Not on file  . Stress: Not on file  Relationships  . Social connections:    Talks on phone: Not on file    Gets together: Not on file    Attends  religious service: Not on file    Active member of club or organization: Not on file    Attends meetings of clubs or organizations: Not on file    Relationship status: Not on file  . Intimate partner violence:    Fear of current or ex partner: Not on file    Emotionally abused: Not on file    Physically abused: Not on file    Forced sexual activity: Not on file  Other Topics Concern  . Not on file  Social History Narrative  . Not on file    Past Surgical History:  Procedure Laterality Date  . ABDOMINAL HYSTERECTOMY  2001   BSO ; SLING PROCEDURE endometriosis/leiomyomata  . CHOLECYSTECTOMY  1976  . DILATION AND CURETTAGE OF UTERUS    . HAND SURGERY  04-02-2011   right  . OOPHORECTOMY     BSO  . ORIF SHOULDER FRACTURE Right 06/28/2014   Procedure: RIGHT SHOULDER HEMI-ARTHROPLASTY;  Surgeon: Netta Cedars, MD;  Location: [facility];  Service: Orthopedics;  Laterality: Right;  . TONSILLECTOMY  1954    Family History  Problem Relation Age of Onset  . Diabetes Mother   . Hypertension Mother   . Cancer Mother  ANGIOSARCOMA  . COPD Father     Allergies  Allergen Reactions  . Augmentin [Amoxicillin-Pot Clavulanate] Itching and Swelling    Headache  . Cephalexin Diarrhea  . Codeine Sulfate     REACTION: nausea, vomiting  . Flexeril [Cyclobenzaprine]     Per patient makes her "very groggy"-unlike usual effect  . Fluconazole Itching and Swelling    REACTION: swelling and itching  . Morphine     REACTION: nausea, itching  . Oxycodone Nausea Only  . Sulfonamide Derivatives     REACTION: rash  . Amoxicillin Rash  . Other Swelling    Pt states shes also allergic to an antibiotic but does not know the name or have her list with her today, this antibiotic causes face swelling.    Current Outpatient Medications on File Prior to Visit  Medication Sig Dispense Refill  . acetaminophen (TYLENOL) 500 MG tablet Take 1 tablet (500 mg total) by mouth every 6 (six) hours as needed  for mild pain or moderate pain. 30 tablet 0  . ALPRAZolam (XANAX) 0.25 MG tablet Take 1 tablet (0.25 mg total) by mouth at bedtime as needed for anxiety. 30 tablet 4  . benzonatate (TESSALON) 100 MG capsule Take 1 capsule (100 mg total) by mouth 2 (two) times daily as needed for cough. 20 capsule 0  . Calcium Carbonate-Vitamin D (CALCIUM + D PO) Take 1 tablet by mouth daily.     . citalopram (CELEXA) 20 MG tablet Take 1 tablet (20 mg total) by mouth daily. 30 tablet 12  . dexamethasone 0.5 MG/5ML elixir     . estradiol (ESTRACE) 0.5 MG tablet Take 1 tablet (0.5 mg total) by mouth daily. 30 tablet 12  . hyoscyamine (LEVBID) 0.375 MG 12 hr tablet Take 0.375 mg by mouth daily.     . Lactobacillus (ACIDOPHILUS PO) Take by mouth daily.    . valACYclovir (VALTREX) 500 MG tablet Take 1 tablet (500 mg total) by mouth 2 (two) times daily. 30 tablet 4   No current facility-administered medications on file prior to visit.     BP 120/70 (BP Location: Right Arm, Patient Position: Sitting, Cuff Size: Large)   Pulse 70   Temp 99.6 F (37.6 C) (Oral)   Wt 192 lb 3.2 oz (87.2 kg)   SpO2 96%   BMI 34.05 kg/m     Review of Systems  Constitutional: Positive for activity change, appetite change, fatigue and fever.  HENT: Negative for congestion, dental problem, hearing loss, rhinorrhea, sinus pressure, sore throat and tinnitus.   Eyes: Negative for pain, discharge and visual disturbance.  Respiratory: Positive for cough. Negative for shortness of breath.   Cardiovascular: Negative for chest pain, palpitations and leg swelling.  Gastrointestinal: Negative for abdominal distention, abdominal pain, blood in stool, constipation, diarrhea, nausea and vomiting.  Genitourinary: Negative for difficulty urinating, dysuria, flank pain, frequency, hematuria, pelvic pain, urgency, vaginal bleeding, vaginal discharge and vaginal pain.  Musculoskeletal: Negative for arthralgias, gait problem and joint swelling.    Skin: Negative for rash.  Neurological: Negative for dizziness, syncope, speech difficulty, weakness, numbness and headaches.  Hematological: Negative for adenopathy.  Psychiatric/Behavioral: Negative for agitation, behavioral problems and dysphoric mood. The patient is not nervous/anxious.        Objective:   Physical Exam  Constitutional: She is oriented to person, place, and time. She appears well-developed and well-nourished. No distress.  Appears a bit unwell but in no acute distress. Blood pressure normal Low-grade fever  HENT:  Head:  Normocephalic.  Right Ear: External ear normal.  Left Ear: External ear normal.  Oropharynx slightly injected.  Eyes: Pupils are equal, round, and reactive to light. Conjunctivae and EOM are normal.  Neck: Normal range of motion. Neck supple. No thyromegaly present.  No adenopathy  Cardiovascular: Normal rate, regular rhythm, normal heart sounds and intact distal pulses.  Pulmonary/Chest: Effort normal and breath sounds normal.  Chest is clear  Abdominal: Soft. Bowel sounds are normal. She exhibits no mass. There is no tenderness.  Musculoskeletal: Normal range of motion.  Lymphadenopathy:    She has no cervical adenopathy.  Neurological: She is alert and oriented to person, place, and time.  Skin: Skin is warm and dry. No rash noted.  Psychiatric: She has a normal mood and affect. Her behavior is normal.          Assessment & Plan:  Viral URI with cough.  Will continue Tylenol and symptomatic treatment.  Will  encourage liberal fluid intake.  Marletta Lor

## 2017-09-29 ENCOUNTER — Encounter: Payer: Self-pay | Admitting: Podiatry

## 2017-09-29 ENCOUNTER — Ambulatory Visit: Payer: Medicare Other | Admitting: Podiatry

## 2017-09-29 DIAGNOSIS — M79609 Pain in unspecified limb: Principal | ICD-10-CM

## 2017-09-29 DIAGNOSIS — M79676 Pain in unspecified toe(s): Secondary | ICD-10-CM

## 2017-09-29 DIAGNOSIS — B351 Tinea unguium: Secondary | ICD-10-CM

## 2017-09-29 NOTE — Progress Notes (Signed)
Patient ID: Brianna Mack, female   DOB: 01/11/1946, 72 y.o.   MRN: [id number] Complaint:  Visit Type: Patient presents to my office for continued preventative foot care services. Complaint: Patient states" my nails have grown long and thick and become painful to walk and wear shoes" . The patient presents for preventative foot care services. She says her callus has improved and there is no pain.  Podiatric Exam: Vascular: dorsalis pedis and posterior tibial pulses are palpable bilateral. Capillary return is immediate. Temperature gradient is WNL. Skin turgor WNL  Sensorium: Normal Semmes Weinstein monofilament test. Normal tactile sensation bilaterally. Nail Exam: Pt has thick disfigured discolored nails with subungual debris noted bilateral entire nail hallux through fifth toenails Ulcer Exam: There is no evidence of ulcer or pre-ulcerative changes or infection. Orthopedic Exam: Muscle tone and strength are WNL. No limitations in general ROM. No crepitus or effusions noted. Foot type and digits show no abnormalities. Bony prominences are unremarkable. Skin:  Porokeratosis sub3/4 right foot  Asymptomatic.Marland Kitchen Porokeratotic lesion left arch. No infection or ulcers  Diagnosis:  Onychomycosis, , Pain in right toe, pain in left toes Debride porokeratosis left  Treatment & Plan Procedures and Treatment: Consent by patient was obtained for treatment procedures. The patient understood the discussion of treatment and procedures well. All questions were answered thoroughly reviewed. Debridement of mycotic and hypertrophic toenails, 1 through 5 bilateral and clearing of subungual debris. No ulceration, no infection noted.  Return Visit-Office Procedure: Patient instructed to return to the office for a follow up visit 12 weeks for continued evaluation and treatment.  Gardiner Barefoot DPM

## 2017-10-04 ENCOUNTER — Other Ambulatory Visit: Payer: Self-pay | Admitting: Internal Medicine

## 2017-10-04 NOTE — Telephone Encounter (Signed)
Pt is requesting a refill of the hycodan cough med.    Last ov--09/20/2017 Last refill 09/20/2017 for 100 ML.  Dr. Raliegh Ip, please advise. Thanks

## 2017-12-07 ENCOUNTER — Encounter: Payer: Medicare Other | Admitting: Gynecology

## 2017-12-07 ENCOUNTER — Ambulatory Visit: Payer: Medicare Other

## 2017-12-10 ENCOUNTER — Ambulatory Visit (INDEPENDENT_AMBULATORY_CARE_PROVIDER_SITE_OTHER): Payer: Medicare Other

## 2017-12-10 DIAGNOSIS — Z23 Encounter for immunization: Secondary | ICD-10-CM | POA: Diagnosis not present

## 2017-12-20 ENCOUNTER — Other Ambulatory Visit: Payer: Self-pay | Admitting: Gynecology

## 2017-12-21 ENCOUNTER — Encounter: Payer: Medicare Other | Admitting: Family Medicine

## 2017-12-22 ENCOUNTER — Ambulatory Visit: Payer: Medicare Other | Admitting: Podiatry

## 2017-12-22 DIAGNOSIS — M79675 Pain in left toe(s): Secondary | ICD-10-CM | POA: Diagnosis not present

## 2017-12-22 DIAGNOSIS — B351 Tinea unguium: Secondary | ICD-10-CM | POA: Diagnosis not present

## 2017-12-22 DIAGNOSIS — M79674 Pain in right toe(s): Secondary | ICD-10-CM

## 2017-12-22 DIAGNOSIS — L84 Corns and callosities: Secondary | ICD-10-CM

## 2017-12-22 NOTE — Patient Instructions (Signed)

## 2017-12-29 ENCOUNTER — Ambulatory Visit: Payer: Medicare Other | Admitting: Podiatry

## 2018-01-08 ENCOUNTER — Encounter: Payer: Self-pay | Admitting: Podiatry

## 2018-01-08 NOTE — Progress Notes (Signed)
Subjective: "My nails and calluses are bothering me."  Brianna Mack presents today for painful, discolored, thick toenails and painful calluses which interfere with daily activities.  Pain is aggravated when wearing enclosed shoe gear and relieved with periodic professional debridement.  She voices no new problems on today's visit.  Objective: Vascular Examination: Capillary refill time immediate to all 10 digits Dorsalis pedis  palpable bilaterally Posterior tibial pulses palpable bilaterally Digital hair x 10 digits sparse bilaterally Skin temperature gradient within normal limits bilaterally  Dermatological Examination: Skin  Toenails 1-5 b/l discolored, thick, dystrophic with subungual debris and pain with palpation to nailbeds due to thickness of nails.  Hyperkeratotic lesion noted sub-fifth met base left foot  Musculoskeletal: Muscle strength 5/5 to all LE muscle groups  Neurological: Sensation intact with 10 gram monofilament. Vibratory sensation intact.  Assessment: Painful onychomycosis toenails 1-5 b/l  Callus sub-fifth met base left foot  Plan: 1. Toenails 1-5 b/l were debrided in length and girth without iatrogenic bleeding. 2. Calluses debrided sub-fifth met base left foot 3. Patient to continue soft, supportive shoe gear 4. Patient to report any pedal injuries to medical professional immediately. 5. Follow up 3 months. Patient/POA to call should there be a concern in the interim.

## 2018-01-10 ENCOUNTER — Encounter: Payer: Self-pay | Admitting: Gynecology

## 2018-01-10 ENCOUNTER — Ambulatory Visit: Payer: Medicare Other | Admitting: Gynecology

## 2018-01-10 VITALS — BP 122/76 | Ht 63.0 in | Wt 180.0 lb

## 2018-01-10 DIAGNOSIS — Z01419 Encounter for gynecological examination (general) (routine) without abnormal findings: Secondary | ICD-10-CM

## 2018-01-10 DIAGNOSIS — F341 Dysthymic disorder: Secondary | ICD-10-CM

## 2018-01-10 DIAGNOSIS — A609 Anogenital herpesviral infection, unspecified: Secondary | ICD-10-CM | POA: Diagnosis not present

## 2018-01-10 DIAGNOSIS — N952 Postmenopausal atrophic vaginitis: Secondary | ICD-10-CM

## 2018-01-10 MED ORDER — ALPRAZOLAM 0.25 MG PO TABS: 0.2500 mg | ORAL_TABLET | Freq: Every evening | ORAL | 4 refills | Status: DC | PRN

## 2018-01-10 MED ORDER — ESTRADIOL 0.5 MG PO TABS: 0.5000 mg | ORAL_TABLET | Freq: Every day | ORAL | 1 refills | Status: DC

## 2018-01-10 MED ORDER — CITALOPRAM HYDROBROMIDE 20 MG PO TABS: 20.0000 mg | ORAL_TABLET | Freq: Every day | ORAL | 12 refills | Status: DC

## 2018-01-10 NOTE — Patient Instructions (Signed)
Wean off of your estrogen as we discussed.  Call if you have any issues with this.  Schedule your mammogram.

## 2018-01-10 NOTE — Progress Notes (Signed)
    Anahi [facility] [birth year] [id number]        72 y.o.  G0P0000 for annual gynecologic exam.  Several issues noted below.  Past medical history,surgical history, problem list, medications, allergies, family history and social history were all reviewed and documented as reviewed in the EPIC chart.  ROS:  Performed with pertinent positives and negatives included in the history, assessment and plan.   Additional significant findings : None   Exam: Caryn Bee assistant Vitals:   01/10/18 1057  BP: 122/76  Weight: 180 lb (81.6 kg)  Height: 5\' 3"  (1.6 m)   Body mass index is 31.89 kg/m.  General appearance:  Normal affect, orientation and appearance. Skin: Grossly normal excepting a number of classic appearing seborrheic keratoses over chest and back. HEENT: Without gross lesions.  No cervical or supraclavicular adenopathy. Thyroid normal.  Lungs:  Clear without wheezing, rales or rhonchi Cardiac: RR, without RMG Abdominal:  Soft, nontender, without masses, guarding, rebound, organomegaly or hernia Breasts:  Examined lying and sitting without masses, retractions, discharge or axillary adenopathy. Pelvic:  Ext, BUS, Vagina: With atrophic changes  Adnexa: Without masses or tenderness    Anus and perineum: Normal   Rectovaginal: Normal sphincter tone without palpated masses or tenderness.    Assessment/Plan:  72 y.o. G0P0000 female for annual gynecologic exam.   1. Postmenopausal/ERT.  Status post TAH/BSO for endometriosis leiomyoma 2001.  On estradiol 0.5 mg.  We discussed trying to wean last year but she never did this.  She is going to go ahead and try this coming January.  We discussed how to slowly wean.  We discussed the risks of ERT again to include stroke heart attack DVT in the breast cancer issue.  If she is unable to wean due to symptoms she will call me for refill. 2. History of HSV diagnosed 2017.  Occasional outbreaks.  Uses Valtrex 500 mg twice daily times several days  with good results.  Has supply but will call when needs more. 3. Anxiety and depression.  Uses Celexa 20 mg daily with Xanax 0.25 mg as needed with good results.  Dealing with the loss of her husband 2 years ago.  Refill on Celexa x1 year.  Xanax No. 30 with 4 refills provided. 4. Pap smear 2016.  No Pap smear done today.  No history of abnormal Pap smears.  We both agree to stop screening per current screening guidelines based on age and hysterectomy history. 5. DEXA 2018 normal.  Recommend repeat DEXA in several years. 6. Colonoscopy 2019.  Repeat at their recommended interval. 7. Mammography 2017.  Need to schedule mammogram now reviewed and patient agrees to call and schedule.  Breast exam normal today. 8. Health maintenance.  No routine lab work done as patient does this elsewhere.  Follow-up 1 year, sooner as needed.   Anastasio Auerbach MD, 11:33 AM 01/10/2018

## 2018-01-21 ENCOUNTER — Other Ambulatory Visit: Payer: Self-pay | Admitting: Gynecology

## 2018-01-21 ENCOUNTER — Encounter: Payer: Self-pay | Admitting: Internal Medicine

## 2018-01-21 ENCOUNTER — Ambulatory Visit: Payer: Medicare Other | Admitting: Internal Medicine

## 2018-01-21 VITALS — BP 110/70 | HR 65 | Temp 98.1°F | Ht 63.0 in | Wt 181.1 lb

## 2018-01-21 DIAGNOSIS — K588 Other irritable bowel syndrome: Secondary | ICD-10-CM | POA: Diagnosis not present

## 2018-01-21 DIAGNOSIS — F341 Dysthymic disorder: Secondary | ICD-10-CM | POA: Diagnosis not present

## 2018-01-21 NOTE — Progress Notes (Signed)
Established Patient Office Visit     CC/Reason for Visit: Establish care and follow-up of chronic medical conditions  HPI: Brianna Mack is a 72 y.o. female who is coming in today for the above mentioned reasons.  Due for annual wellness exam in May 2020.  Past Medical History is significant for: Depression and anxiety ever since losing her husband to cancer 18 months ago, irritable bowel syndrome.  She has been coping well and remains active in her community.  Has joined a fitness center and was going routinely until she had to stop going when she developed a stress fracture.  She is planning on returning.  She has many friends and family that she plans on visiting over the holidays and during the summer.  Her GYN, Dr. Phineas Real, sees her annually and is in charge of her mammograms and DEXA scans.  She also sees Dr. Thana Farr with GI for her IBS and colonoscopy screening.  She has no acute complaints at today's visit.   Past Medical/Surgical History: Past Medical History:  Diagnosis Date  . Anxiety   . Arthritis   . Depression   . HSV (herpes simplex virus) anogenital infection 11/2015  . IBS (irritable bowel syndrome)   . Stress fracture     Past Surgical History:  Procedure Laterality Date  . ABDOMINAL HYSTERECTOMY  2001   BSO ; SLING PROCEDURE endometriosis/leiomyomata  . CATARACT EXTRACTION, BILATERAL    . CHOLECYSTECTOMY  1976  . DILATION AND CURETTAGE OF UTERUS    . HAND SURGERY  04-02-2011   right  . OOPHORECTOMY     BSO  . ORIF SHOULDER FRACTURE Right 06/28/2014   Procedure: RIGHT SHOULDER HEMI-ARTHROPLASTY;  Surgeon: Netta Cedars, MD;  Location: [facility];  Service: Orthopedics;  Laterality: Right;  . TONSILLECTOMY  1954    Social History:  reports that she quit smoking about 40 years ago. She has never used smokeless tobacco. She reports that she drinks alcohol. She reports that she does not use drugs.  Allergies: Allergies  Allergen Reactions  . Augmentin  [Amoxicillin-Pot Clavulanate] Itching and Swelling    Headache  . Cephalexin Diarrhea  . Codeine Sulfate     REACTION: nausea, vomiting  . Fluconazole Itching and Swelling    REACTION: swelling and itching  . Morphine     REACTION: nausea, itching  . Oxycodone Nausea Only  . Sulfonamide Derivatives     REACTION: rash  . Amoxicillin Rash  . Cyclobenzaprine     Per patient makes her "very groggy"-unlike usual effect  . Other Swelling    Pt states shes also allergic to an antibiotic but does not know the name or have her list with her today, this antibiotic causes face swelling.    Family History:  Family History  Problem Relation Age of Onset  . Diabetes Mother   . Hypertension Mother   . Cancer Mother        Laurell Roof  . COPD Father      Current Outpatient Medications:  .  acetaminophen (TYLENOL) 500 MG tablet, Take 1 tablet (500 mg total) by mouth every 6 (six) hours as needed for mild pain or moderate pain., Disp: 30 tablet, Rfl: 0 .  ALPRAZolam (XANAX) 0.25 MG tablet, Take 1 tablet (0.25 mg total) by mouth at bedtime as needed for anxiety., Disp: 30 tablet, Rfl: 4 .  Calcium Carbonate-Vitamin D (CALCIUM + D PO), Take 1 tablet by mouth daily. , Disp: , Rfl:  .  citalopram (CELEXA) 20 MG tablet, Take 1 tablet (20 mg total) by mouth daily., Disp: 30 tablet, Rfl: 12 .  dexamethasone 0.5 MG/5ML elixir, , Disp: , Rfl:  .  estradiol (ESTRACE) 0.5 MG tablet, Take 1 tablet (0.5 mg total) by mouth daily., Disp: 30 tablet, Rfl: 1 .  hyoscyamine (LEVBID) 0.375 MG 12 hr tablet, Take 0.375 mg by mouth daily. , Disp: , Rfl:  .  valACYclovir (VALTREX) 500 MG tablet, Take 1 tablet (500 mg total) by mouth 2 (two) times daily., Disp: 30 tablet, Rfl: 4  Review of Systems:  Constitutional: Denies fever, chills, diaphoresis, appetite change and fatigue.  HEENT: Denies photophobia, eye pain, redness, hearing loss, ear pain, congestion, sore throat, rhinorrhea, sneezing, mouth sores, trouble  swallowing, neck pain, neck stiffness and tinnitus.   Respiratory: Denies SOB, DOE, cough, chest tightness,  and wheezing.   Cardiovascular: Denies chest pain, palpitations and leg swelling.  Gastrointestinal: Denies nausea, vomiting, abdominal pain, diarrhea, constipation, blood in stool and abdominal distention.  Genitourinary: Denies dysuria, urgency, frequency, hematuria, flank pain and difficulty urinating.  Endocrine: Denies: hot or cold intolerance, sweats, changes in hair or nails, polyuria, polydipsia. Musculoskeletal: Denies myalgias, back pain, joint swelling, arthralgias and gait problem.  Skin: Denies pallor, rash and wound.  Neurological: Denies dizziness, seizures, syncope, weakness, light-headedness, numbness and headaches.  Hematological: Denies adenopathy. Easy bruising, personal or family bleeding history  Psychiatric/Behavioral: Denies suicidal ideation, mood changes, confusion, nervousness, sleep disturbance and agitation    Physical Exam: Vitals:   01/21/18 1106  BP: 110/70  Pulse: 65  Temp: 98.1 F (36.7 C)  TempSrc: Oral  SpO2: 96%  Weight: 181 lb 1.6 oz (82.1 kg)  Height: 5\' 3"  (1.6 m)    Body mass index is 32.08 kg/m.   Constitutional: NAD, calm, comfortable Eyes: PERRL, lids and conjunctivae normal ENMT: Mucous membranes are moist. Posterior pharynx clear of any exudate or lesions. Normal dentition.  Neck: normal, supple, no masses, no thyromegaly Respiratory: clear to auscultation bilaterally, no wheezing, no crackles. Normal respiratory effort. No accessory muscle use.  Cardiovascular: Regular rate and rhythm, no murmurs / rubs / gallops. No extremity edema. 2+ pedal pulses. No carotid bruits.  Abdomen: no tenderness, no masses palpated. No hepatosplenomegaly. Bowel sounds positive.  Musculoskeletal: no clubbing / cyanosis. No joint deformity upper and lower extremities. Good ROM, no contractures. Normal muscle tone.  Skin: no rashes, lesions,  ulcers. No induration Neurologic: Grossly intact and nonfocal Psychiatric: Normal judgment and insight. Alert and oriented x 3. Normal mood.    Impression and Plan:  ANXIETY DEPRESSION -She does well, remains active in the community, his joint is tender. -She is not requesting medication refills today.  Other irritable bowel syndrome -This is very well controlled, she sometimes has issues with certain food choices but she now has to avoid.  I will see her back in May 2020 for her annual physical exam.    Patient Instructions  -It was nice meeting you today!  -Please return in May 2020 for your annual physical, come fasting to that visit.     Lelon Frohlich, MD [facility]

## 2018-01-21 NOTE — Patient Instructions (Signed)
-  It was nice meeting you today!  -Please return in May 2020 for your annual physical, come fasting to that visit.

## 2018-03-17 DIAGNOSIS — M25562 Pain in left knee: Secondary | ICD-10-CM | POA: Insufficient documentation

## 2018-03-23 ENCOUNTER — Ambulatory Visit: Payer: Medicare Other | Admitting: Podiatry

## 2018-03-23 DIAGNOSIS — B351 Tinea unguium: Secondary | ICD-10-CM

## 2018-03-23 DIAGNOSIS — M79675 Pain in left toe(s): Secondary | ICD-10-CM | POA: Diagnosis not present

## 2018-03-23 DIAGNOSIS — L84 Corns and callosities: Secondary | ICD-10-CM

## 2018-03-23 DIAGNOSIS — M79674 Pain in right toe(s): Secondary | ICD-10-CM

## 2018-03-23 NOTE — Patient Instructions (Signed)

## 2018-03-28 ENCOUNTER — Encounter: Payer: Self-pay | Admitting: Podiatry

## 2018-03-28 NOTE — Progress Notes (Signed)
Subjective: Brianna Mack presents today with painful, thick toenails 1-5 b/l that she cannot cut and which interfere with daily activities.  Pain is aggravated when wearing enclosed shoe gear.  She relates painful left great toe pain. Her PCP thought it could be gout and   she just completed steroid dosepack from PCP which helped with pain. She states toe is better.  Isaac Bliss, Rayford Halsted, MD is her PCP. Last visit was 01/21/2018.   Current Outpatient Medications:  .  acetaminophen (TYLENOL) 500 MG tablet, Take 1 tablet (500 mg total) by mouth every 6 (six) hours as needed for mild pain or moderate pain., Disp: 30 tablet, Rfl: 0 .  ALPRAZolam (XANAX) 0.25 MG tablet, Take 1 tablet (0.25 mg total) by mouth at bedtime as needed for anxiety., Disp: 30 tablet, Rfl: 4 .  Calcium Carbonate-Vitamin D (CALCIUM + D PO), Take 1 tablet by mouth daily. , Disp: , Rfl:  .  citalopram (CELEXA) 20 MG tablet, Take 1 tablet (20 mg total) by mouth daily., Disp: 30 tablet, Rfl: 12 .  dexamethasone 0.5 MG/5ML elixir, , Disp: , Rfl:  .  estradiol (ESTRACE) 0.5 MG tablet, Take 1 tablet (0.5 mg total) by mouth daily., Disp: 30 tablet, Rfl: 1 .  hyoscyamine (LEVBID) 0.375 MG 12 hr tablet, Take 0.375 mg by mouth daily. , Disp: , Rfl:  .  valACYclovir (VALTREX) 500 MG tablet, Take 1 tablet (500 mg total) by mouth 2 (two) times daily., Disp: 30 tablet, Rfl: 4  Allergies  Allergen Reactions  . Augmentin [Amoxicillin-Pot Clavulanate] Itching and Swelling    Headache  . Cephalexin Diarrhea  . Codeine Sulfate     REACTION: nausea, vomiting  . Fluconazole Itching and Swelling    REACTION: swelling and itching  . Morphine     REACTION: nausea, itching  . Oxycodone Nausea Only  . Sulfonamide Derivatives     REACTION: rash  . Amoxicillin Rash  . Cyclobenzaprine     Per patient makes her "very groggy"-unlike usual effect  . Other Swelling    Pt states shes also allergic to an antibiotic but does not know the  name or have her list with her today, this antibiotic causes face swelling.    Objective:  Vascular Examination: Capillary refill time immediate x 10 digits  Dorsalis pedis and Posterior tibial pulses palpable b/l  Digital hair sparse x 10 digits  Skin temperature gradient WNL b/l  Dermatological Examination: Skin with normal turgor, texture and tone b/l  Toenails 1-5 b/l discolored, thick, dystrophic with subungual debris and pain with palpation to nailbeds due to thickness of nails. Left great toe medial border incurvated. No erythema, no edema, no drainage, no flocculence.  Hyperkeratotic lesion sub 5th metatarsal base left foot. No erythema, no edema, no drainage, no flocculence.  Musculoskeletal: Muscle strength 5/5 to all LE muscle groups  Neurological: Sensation intact with 10 gram monofilament.  Vibratory sensation intact.  Assessment: Painful onychomycosis toenails 1-5 b/l  Callus sub 5th met base left foot  Plan: 1. Toenails 1-5 b/l were debrided in length and girth without iatrogenic bleeding. Left great toe debrided and curretaged. Borders cleansed with alcohol. Polysporin and bandaid applied to digit. She may remove bandaid on tomorrow. 2. Hyperkeratotic lesion sub 5th met base left foot pared without incident. 3. Patient to continue soft, supportive shoe gear 4. Patient to report any pedal injuries to medical professional immediately. 5. Follow up 3 months.  6. Patient/POA to call should there be a  concern in the interim.

## 2018-04-19 ENCOUNTER — Ambulatory Visit (INDEPENDENT_AMBULATORY_CARE_PROVIDER_SITE_OTHER): Payer: Medicare Other | Admitting: Family Medicine

## 2018-04-19 ENCOUNTER — Encounter: Payer: Self-pay | Admitting: Family Medicine

## 2018-04-19 VITALS — BP 130/62 | HR 86 | Temp 99.0°F | Wt 183.1 lb

## 2018-04-19 DIAGNOSIS — J069 Acute upper respiratory infection, unspecified: Secondary | ICD-10-CM

## 2018-04-19 MED ORDER — HYDROCODONE-HOMATROPINE 5-1.5 MG/5ML PO SYRP: 5.0000 mL | ORAL_SOLUTION | ORAL | 0 refills | Status: DC | PRN

## 2018-04-19 NOTE — Progress Notes (Signed)
   Subjective:    Patient ID: Brianna Mack, female    DOB: [birth year], 73 y.o.   MRN: [id number]  HPI Here for 4 days of low grade fever and a dry cough. No sinus drainage or ST. Drinking fluids and taking Mucinex.    Review of Systems  Constitutional: Positive for fever.  HENT: Positive for congestion. Negative for postnasal drip, sinus pressure, sinus pain and sore throat.   Eyes: Negative.   Respiratory: Positive for cough.        Objective:   Physical Exam Constitutional:      Appearance: Normal appearance.  HENT:     Right Ear: Tympanic membrane and ear canal normal.     Left Ear: Tympanic membrane and ear canal normal.     Nose: Nose normal.     Mouth/Throat:     Pharynx: Oropharynx is clear.  Eyes:     Conjunctiva/sclera: Conjunctivae normal.  Pulmonary:     Effort: Pulmonary effort is normal. No respiratory distress.     Breath sounds: Normal breath sounds. No stridor. No wheezing, rhonchi or rales.  Lymphadenopathy:     Cervical: No cervical adenopathy.  Neurological:     Mental Status: She is alert.           Assessment & Plan:  Viral URI, use Hydromet for cough. Recheck prn.  Alysia Penna, MD

## 2018-05-24 ENCOUNTER — Ambulatory Visit (INDEPENDENT_AMBULATORY_CARE_PROVIDER_SITE_OTHER): Payer: Medicare Other | Admitting: Gynecology

## 2018-05-24 ENCOUNTER — Encounter: Payer: Self-pay | Admitting: Gynecology

## 2018-05-24 ENCOUNTER — Other Ambulatory Visit: Payer: Self-pay

## 2018-05-24 DIAGNOSIS — N3000 Acute cystitis without hematuria: Secondary | ICD-10-CM | POA: Diagnosis not present

## 2018-05-24 MED ORDER — NITROFURANTOIN MONOHYD MACRO 100 MG PO CAPS: 100.0000 mg | ORAL_CAPSULE | Freq: Two times a day (BID) | ORAL | 0 refills | Status: DC

## 2018-05-24 NOTE — Patient Instructions (Signed)
Take the prescribed antibiotic twice daily for 5 days.  Follow-up if your symptoms persist, worsen or recur.

## 2018-05-24 NOTE — Progress Notes (Signed)
    Brianna Mack [facility] April 04, 1945 [id number]        73 y.o.  G0P0000 for telemedicine encounter.  She was identified by 2 identifiers.  She is calling from home to my office.  She agrees to the telemedicine format.  Complaining of 1 day of dysuria.  Notes with voiding she has a stinging sensation in her urethra.  Also notes that she is going a little more frequently.  No urgency, low back pain, fever or chills.  No abdominal pain, nausea, vomiting, diarrhea or constipation.  No vaginal discharge, itching, irritation or odor.  Has had yeast infections in the past and does not feel that this is a yeast infection.  Past medical history,surgical history, problem list, medications, allergies, family history and social history were all reviewed and documented in the EPIC chart.  Directed ROS with pertinent positives and negatives documented in the history of present illness/assessment and plan.  Assessment/Plan:  73 y.o. G0P0000 with symptoms consistent with a early urethritis/UTI.  Treatment options were reviewed and I reviewed all of her drug allergies.  Will treat with Macrobid 100 mg twice daily x5 days as it appears to be an early infection.  Precautions for worsening symptoms reviewed and she will call later this week if she is not symptom-free.  12 minutes of time was taken in direct discussion with the patient as well as review of her medical records   Anastasio Auerbach MD, 12:41 PM 05/24/2018

## 2018-06-22 ENCOUNTER — Encounter: Payer: Self-pay | Admitting: Podiatry

## 2018-06-22 ENCOUNTER — Other Ambulatory Visit: Payer: Self-pay

## 2018-06-22 ENCOUNTER — Ambulatory Visit: Payer: Medicare Other | Admitting: Podiatry

## 2018-06-22 VITALS — Temp 97.2°F

## 2018-06-22 DIAGNOSIS — L84 Corns and callosities: Secondary | ICD-10-CM

## 2018-06-22 DIAGNOSIS — M79675 Pain in left toe(s): Secondary | ICD-10-CM

## 2018-06-22 DIAGNOSIS — B351 Tinea unguium: Secondary | ICD-10-CM

## 2018-06-22 DIAGNOSIS — M79674 Pain in right toe(s): Secondary | ICD-10-CM

## 2018-06-22 NOTE — Patient Instructions (Addendum)
Onychomycosis/Fungal Toenails  WHAT IS IT? An infection that lies within the keratin of your nail plate that is caused by a fungus.  WHY ME? Fungal infections affect all ages, sexes, races, and creeds.  There may be many factors that predispose you to a fungal infection such as age, coexisting medical conditions such as diabetes, or an autoimmune disease; stress, medications, fatigue, genetics, etc.  Bottom line: fungus thrives in a warm, moist environment and your shoes offer such a location.  IS IT CONTAGIOUS? Theoretically, yes.  You do not want to share shoes, nail clippers or files with someone who has fungal toenails.  Walking around barefoot in the same room or sleeping in the same bed is unlikely to transfer the organism.  It is important to realize, however, that fungus can spread easily from one nail to the next on the same foot.  HOW DO WE TREAT THIS?  There are several ways to treat this condition.  Treatment may depend on many factors such as age, medications, pregnancy, liver and kidney conditions, etc.  It is best to ask your doctor which options are available to you.  1. No treatment.   Unlike many other medical concerns, you can live with this condition.  However for many people this can be a painful condition and may lead to ingrown toenails or a bacterial infection.  It is recommended that you keep the nails cut short to help reduce the amount of fungal nail. 2. Topical treatment.  These range from herbal remedies to prescription strength nail lacquers.  About 40-50% effective, topicals require twice daily application for approximately 9 to 12 months or until an entirely new nail has grown out.  The most effective topicals are medical grade medications available through physicians offices. 3. Oral antifungal medications.  With an 80-90% cure rate, the most common oral medication requires 3 to 4 months of therapy and stays in your system for a year as the new nail grows out.  Oral  antifungal medications do require blood work to make sure it is a safe drug for you.  A liver function panel will be performed prior to starting the medication and after the first month of treatment.  It is important to have the blood work performed to avoid any harmful side effects.  In general, this medication safe but blood work is required. 4. Laser Therapy.  This treatment is performed by applying a specialized laser to the affected nail plate.  This therapy is noninvasive, fast, and non-painful.  It is not covered by insurance and is therefore, out of pocket.  The results have been very good with a 80-95% cure rate.  The [facility] is the only practice in the area to offer this therapy. 5. Permanent Nail Avulsion.  Removing the entire nail so that a new nail will not grow back.  Corns and Calluses Corns are small areas of thickened skin that occur on the top, sides, or tip of a toe. They contain a cone-shaped core with a point that can press on a nerve below. This causes pain.  Calluses are areas of thickened skin that can occur anywhere on the body, including the hands, fingers, palms, soles of the feet, and heels. Calluses are usually larger than corns. What are the causes? Corns and calluses are caused by rubbing (friction) or pressure, such as from shoes that are too tight or do not fit properly. What increases the risk? Corns are more likely to develop in people   who have misshapen toes (toe deformities), such as hammer toes. Calluses can occur with friction to any area of the skin. They are more likely to develop in people who:  Work with their hands.  Wear shoes that fit poorly, are too tight, or are high-heeled.  Have toe deformities. What are the signs or symptoms? Symptoms of a corn or callus include:  A hard growth on the skin.  Pain or tenderness under the skin.  Redness and swelling.  Increased discomfort while wearing tight-fitting shoes, if your feet are  affected. If a corn or callus becomes infected, symptoms may include:  Redness and swelling that gets worse.  Pain.  Fluid, blood, or pus draining from the corn or callus. How is this diagnosed? Corns and calluses may be diagnosed based on your symptoms, your medical history, and a physical exam. How is this treated? Treatment for corns and calluses may include:  Removing the cause of the friction or pressure. This may involve: ? Changing your shoes. ? Wearing shoe inserts (orthotics) or other protective layers in your shoes, such as a corn pad. ? Wearing gloves.  Applying medicine to the skin (topical medicine) to help soften skin in the hardened, thickened areas.  Removing layers of dead skin with a file to reduce the size of the corn or callus.  Removing the corn or callus with a scalpel or laser.  Taking antibiotic medicines, if your corn or callus is infected.  Having surgery, if a toe deformity is the cause. Follow these instructions at home:   Take over-the-counter and prescription medicines only as told by your health care provider.  If you were prescribed an antibiotic, take it as told by your health care provider. Do not stop taking it even if your condition starts to improve.  Wear shoes that fit well. Avoid wearing high-heeled shoes and shoes that are too tight or too loose.  Wear any padding, protective layers, gloves, or orthotics as told by your health care provider.  Soak your hands or feet and then use a file or pumice stone to soften your corn or callus. Do this as told by your health care provider.  Check your corn or callus every day for symptoms of infection. Contact a health care provider if you:  Notice that your symptoms do not improve with treatment.  Have redness or swelling that gets worse.  Notice that your corn or callus becomes painful.  Have fluid, blood, or pus coming from your corn or callus.  Have new symptoms. Summary  Corns are  small areas of thickened skin that occur on the top, sides, or tip of a toe.  Calluses are areas of thickened skin that can occur anywhere on the body, including the hands, fingers, palms, and soles of the feet. Calluses are usually larger than corns.  Corns and calluses are caused by rubbing (friction) or pressure, such as from shoes that are too tight or do not fit properly.  Treatment may include wearing any padding, protective layers, gloves, or orthotics as told by your health care provider. This information is not intended to replace advice given to you by your health care provider. Make sure you discuss any questions you have with your health care provider. Document Released: 11/09/2003 Document Revised: 12/16/2016 Document Reviewed: 12/16/2016 Elsevier Interactive Patient Education  2019 [employer]  

## 2018-06-29 ENCOUNTER — Encounter: Payer: Self-pay | Admitting: Podiatry

## 2018-06-29 NOTE — Progress Notes (Signed)
Subjective: Brianna Mack presents today with painful, thick toenails 1-5 b/l that she cannot cut and which interfere with daily activities.  Pain is aggravated when wearing enclosed shoe gear.  Isaac Bliss, Rayford Halsted, MD is her PCP and last visit was 01/21/2018.   Current Outpatient Medications:  .  acetaminophen (TYLENOL) 500 MG tablet, Take 1 tablet (500 mg total) by mouth every 6 (six) hours as needed for mild pain or moderate pain., Disp: 30 tablet, Rfl: 0 .  ALPRAZolam (XANAX) 0.25 MG tablet, Take 1 tablet (0.25 mg total) by mouth at bedtime as needed for anxiety., Disp: 30 tablet, Rfl: 4 .  Ca Phosphate-Cholecalciferol (CALTRATE GUMMY BITES PO), Citracal-D3 Gummies, Disp: , Rfl:  .  Calcium Carbonate-Vitamin D (CALCIUM + D PO), Take 1 tablet by mouth daily. , Disp: , Rfl:  .  Calcium Carbonate-Vitamin D (CALCIUM 500 + D) 500-125 MG-UNIT TABS, Take by mouth., Disp: , Rfl:  .  cetirizine (ZYRTEC) 10 MG tablet, Take by mouth., Disp: , Rfl:  .  citalopram (CELEXA) 20 MG tablet, Take 1 tablet (20 mg total) by mouth daily., Disp: 30 tablet, Rfl: 12 .  dexamethasone 0.5 MG/5ML elixir, , Disp: , Rfl:  .  hyoscyamine (LEVBID) 0.375 MG 12 hr tablet, Take 0.375 mg by mouth daily. , Disp: , Rfl:  .  nitrofurantoin, macrocrystal-monohydrate, (MACROBID) 100 MG capsule, Take 1 capsule (100 mg total) by mouth 2 (two) times daily., Disp: 10 capsule, Rfl: 0 .  Propylene Glycol 0.6 % SOLN, 1 drop daily., Disp: , Rfl:  .  valACYclovir (VALTREX) 500 MG tablet, Take 1 tablet (500 mg total) by mouth 2 (two) times daily., Disp: 30 tablet, Rfl: 4  Allergies  Allergen Reactions  . Augmentin [Amoxicillin-Pot Clavulanate] Itching and Swelling    Headache  . Cephalexin Diarrhea  . Codeine Sulfate     REACTION: nausea, vomiting  . Fluconazole Itching and Swelling    REACTION: swelling and itching  . Morphine     REACTION: nausea, itching  . Oxycodone Nausea Only  . Sulfonamide Derivatives    REACTION: rash  . Amoxicillin Rash  . Cyclobenzaprine     Per patient makes her "very groggy"-unlike usual effect  . Other Swelling    Pt states shes also allergic to an antibiotic but does not know the name or have her list with her today, this antibiotic causes face swelling.    Objective:  Vascular Examination: Capillary refill time immediate x 10 digits.  Dorsalis pedis and Posterior tibial pulses palpable b/l.  Digital hair remains sparse x 10 digits  Skin temperature gradient WNL b/l.  Dermatological Examination: Skin with normal turgor, texture and tone b/l.  Toenails 1-5 b/l discolored, thick, dystrophic with subungual debris and pain with palpation to nailbeds due to thickness of nails.  Hyperkeratotic lesion plantar 5th metatarsal base left foot.  No erythema, no edema, no drainage, no flocculence noted.  Musculoskeletal: Muscle strength 5/5 to all LE muscle groups  No gross bony deformities b/l.  No pain, crepitus or joint limitation noted with ROM.   Neurological: Sensation intact with 10 gram monofilament.  Vibratory sensation intact.  Assessment: 1.  Painful onychomycosis toenails 1-5 b/l  2.  Callus plantar 5th metatarsal base left foot  Plan: 1. Toenails 1-5 b/l were debrided in length and girth without iatrogenic bleeding. Calluses pared plantar 5th metatarsal base left foot utilizing sterile scalpel blade without incident. 2. Patient to continue soft, supportive shoe gear daily. 3. Patient to report  any pedal injuries to medical professional immediately. 4. Follow up 3 months.  5. Patient/POA to call should there be a concern in the interim.

## 2018-07-24 IMAGING — DX DG KNEE COMPLETE 4+V*R*
1 series · 1 of 1 positions shown · non-contrast
Comparison: None.

CLINICAL DATA: Fall with pain

EXAM:
RIGHT KNEE - COMPLETE 4+ VIEW

[knee obl]
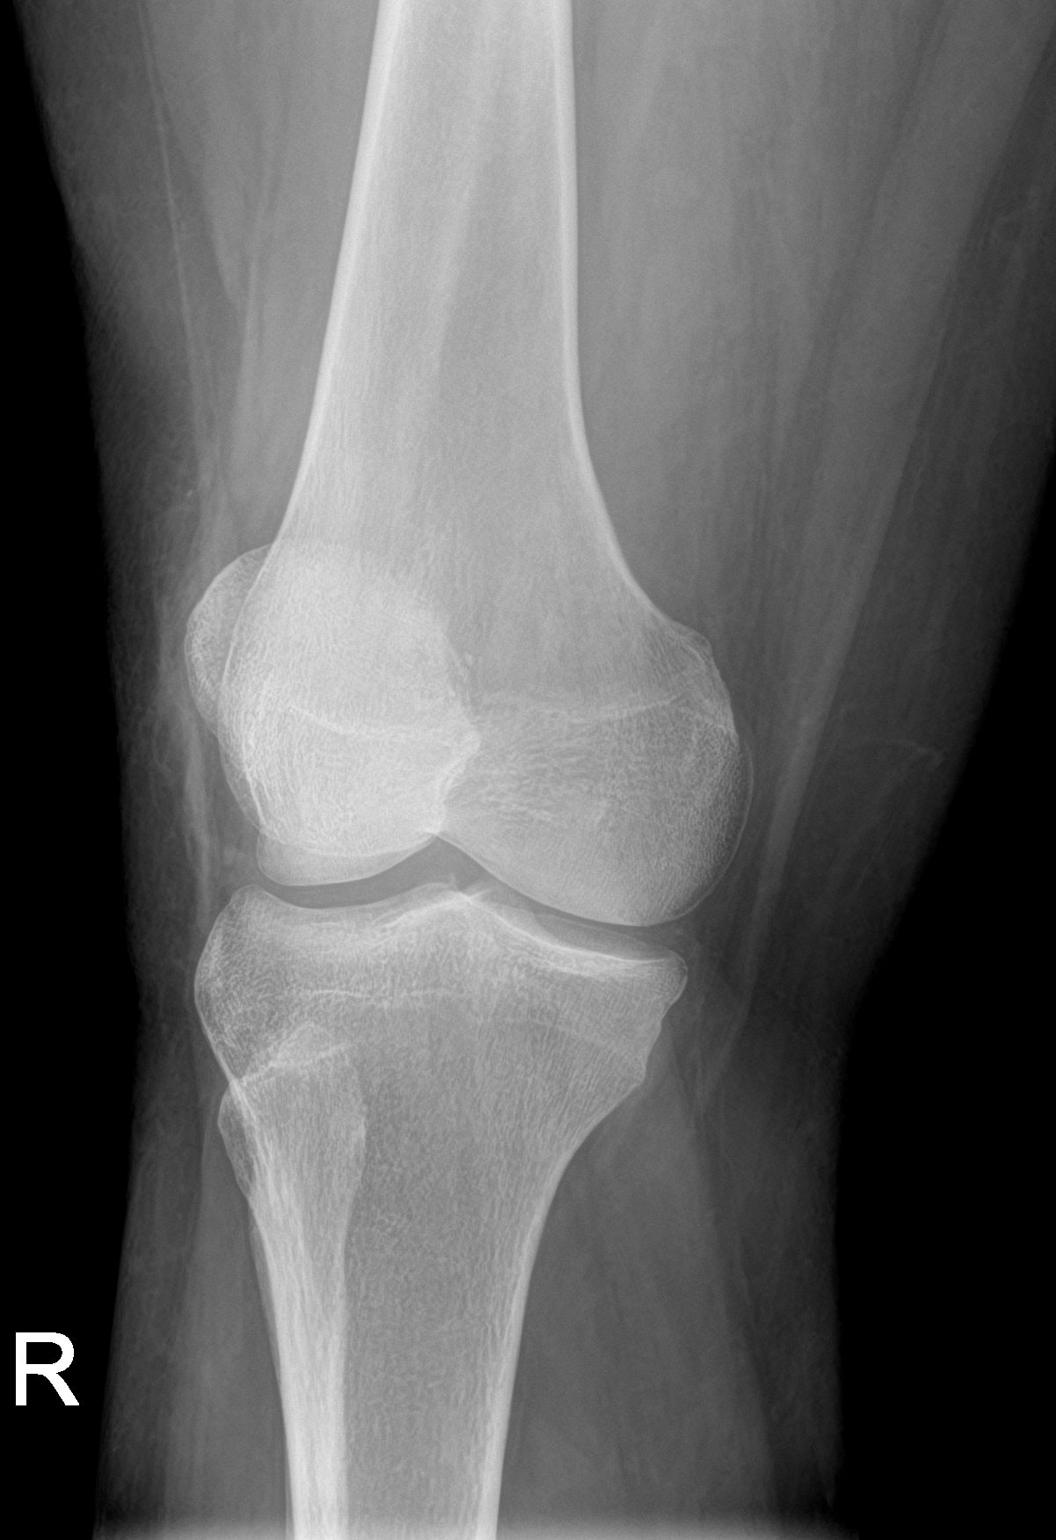

[1 of 1 positions shown; findings below may reference images not displayed]

FINDINGS: Mild narrowing of the medial compartment. Questionable cortical
regularity along the medial aspect of patella.
IMPRESSION: 1. Questionable cortical irregularity along the medial pole of the
patella, cannot rule out small avulsion injuries. Sunrise view may
be helpful for further evaluation.
2. Otherwise no acute osseous abnormality.

## 2018-08-15 ENCOUNTER — Other Ambulatory Visit: Payer: Self-pay | Admitting: Gynecology

## 2018-09-20 ENCOUNTER — Encounter: Payer: Self-pay | Admitting: Podiatry

## 2018-09-20 ENCOUNTER — Other Ambulatory Visit: Payer: Self-pay

## 2018-09-20 ENCOUNTER — Ambulatory Visit: Payer: Medicare Other | Admitting: Podiatry

## 2018-09-20 DIAGNOSIS — B351 Tinea unguium: Secondary | ICD-10-CM | POA: Diagnosis not present

## 2018-09-20 DIAGNOSIS — M79675 Pain in left toe(s): Secondary | ICD-10-CM | POA: Diagnosis not present

## 2018-09-20 DIAGNOSIS — L84 Corns and callosities: Secondary | ICD-10-CM

## 2018-09-20 DIAGNOSIS — Q828 Other specified congenital malformations of skin: Secondary | ICD-10-CM | POA: Diagnosis not present

## 2018-09-20 DIAGNOSIS — M79674 Pain in right toe(s): Secondary | ICD-10-CM

## 2018-09-20 NOTE — Patient Instructions (Signed)

## 2018-09-22 NOTE — Progress Notes (Signed)
Subjective: Brianna Mack presents to clinic with cc of painful mycotic toenails and porokeratotic lesion lateral aspect left foot which are aggravated when weightbearing with and without shoe gear.  This pain limits her daily activities. Pain symptoms resolve with periodic professional debridement.  Brianna Mack, Brianna Halsted, MD is her PCP.    Current Outpatient Medications:  .  acetaminophen (TYLENOL) 500 MG tablet, Take 1 tablet (500 mg total) by mouth every 6 (six) hours as needed for mild pain or moderate pain., Disp: 30 tablet, Rfl: 0 .  ALPRAZolam (XANAX) 0.25 MG tablet, Take 1 tablet (0.25 mg total) by mouth at bedtime as needed for anxiety., Disp: 30 tablet, Rfl: 4 .  Ca Phosphate-Cholecalciferol (CALTRATE GUMMY BITES PO), Citracal-D3 Gummies, Disp: , Rfl:  .  Calcium Carbonate-Vitamin D (CALCIUM + D PO), Take 1 tablet by mouth daily. , Disp: , Rfl:  .  Calcium Carbonate-Vitamin D (CALCIUM 500 + D) 500-125 MG-UNIT TABS, Take by mouth., Disp: , Rfl:  .  cetirizine (ZYRTEC) 10 MG tablet, Take by mouth., Disp: , Rfl:  .  citalopram (CELEXA) 20 MG tablet, Take 1 tablet (20 mg total) by mouth daily., Disp: 30 tablet, Rfl: 12 .  clindamycin (CLEOCIN) 150 MG capsule, , Disp: , Rfl:  .  clobetasol cream (TEMOVATE) 0.05 %, , Disp: , Rfl:  .  dexamethasone 0.5 MG/5ML elixir, , Disp: , Rfl:  .  hyoscyamine (LEVBID) 0.375 MG 12 hr tablet, Take 0.375 mg by mouth daily. , Disp: , Rfl:  .  nitrofurantoin, macrocrystal-monohydrate, (MACROBID) 100 MG capsule, Take 1 capsule (100 mg total) by mouth 2 (two) times daily., Disp: 10 capsule, Rfl: 0 .  Propylene Glycol 0.6 % SOLN, 1 drop daily., Disp: , Rfl:  .  valACYclovir (VALTREX) 500 MG tablet, TAKE 1 TABLET BY MOUTH TWICE DAILY., Disp: 30 tablet, Rfl: 0   Allergies  Allergen Reactions  . Augmentin [Amoxicillin-Pot Clavulanate] Itching and Swelling    Headache  . Cephalexin Diarrhea  . Codeine Sulfate     REACTION: nausea, vomiting  .  Fluconazole Itching and Swelling    REACTION: swelling and itching  . Morphine     REACTION: nausea, itching  . Oxycodone Nausea Only  . Sulfonamide Derivatives     REACTION: rash  . Amoxicillin Rash  . Cyclobenzaprine     Per patient makes her "very groggy"-unlike usual effect  . Other Swelling    Pt states shes also allergic to an antibiotic but does not know the name or have her list with her today, this antibiotic causes face swelling.     Objective:  Physical Examination:  Vascular  Examination: Capillary refill time immediate x 10 digits.  Palpable DP/PT pulses b/l.  Digital hair sparse b/l.  No edema noted b/l.  Skin temperature gradient WNL b/l.  Dermatological Examination: Skin with normal turgor, texture and tone b/l.  No open wounds b/l.  No interdigital macerations noted b/l.  Elongated, thick, discolored brittle toenails with subungual debris and pain on dorsal palpation of nailbeds 1-5 b/l.  Porokeratotic lesion lateral 5th met base left foot.  Hyperkeratotic lesion submet head 5 left foot with tenderness to palpation. No edema, no erythema, no drainage, no flocculence.  Musculoskeletal Examination: Muscle strength 5/5 to all muscle groups b/l.  No pain, crepitus or joint discomfort with active/passive ROM.  Neurological Examination: Sensation intact 5/5 b/l with 10 gram monofilament.  Vibratory sensation intact b/l.  Proprioceptive sensation intact b/l.  Assessment: 1. Mycotic nail infection  with pain 1-5 b/l 2. Callus submet head 5 left foot 3. Porokeratotic lesion lateral 5th met base left foot  Plan: 1. Toenails 1-5 b/l were debrided in length and girth without iatrogenic laceration. 2. Painful porokeratotic lesion pared lateral 5th met base left foot. Iatrogenic laceration addressed with Lumicain Hemostatic Solution. Triple antibiotic ointment and band-aid applied. Brianna Mack instructed to apply TAO to area once daily for one  week. 3. Callus pared submetatarsal head 5 left foot utilizing sterile scalpel blade without incident. 4. Continue soft, supportive shoe gear daily. 5. Report any pedal injuries to medical professional. 6. Follow up 3 months. 7. Patient/POA to call should there be a question/concern in there interim.

## 2018-09-27 ENCOUNTER — Ambulatory Visit: Payer: Self-pay

## 2018-09-27 ENCOUNTER — Other Ambulatory Visit: Payer: Self-pay

## 2018-09-27 DIAGNOSIS — M79675 Pain in left toe(s): Secondary | ICD-10-CM

## 2018-09-27 DIAGNOSIS — B351 Tinea unguium: Secondary | ICD-10-CM

## 2018-10-04 ENCOUNTER — Other Ambulatory Visit: Payer: Self-pay | Admitting: *Deleted

## 2018-10-04 MED ORDER — VALACYCLOVIR HCL 500 MG PO TABS: 500.0000 mg | ORAL_TABLET | Freq: Two times a day (BID) | ORAL | 3 refills | Status: DC

## 2018-10-21 NOTE — Progress Notes (Signed)
Pt presents with mycotic infection of nails 1-5 bilateral.  All other systems are negative  Laser therapy administered to affected nails and tolerated well. All safety precautions were in place.  1st treatment.  Follow up in 4 weeks     

## 2018-10-28 ENCOUNTER — Other Ambulatory Visit: Payer: Self-pay

## 2018-10-28 ENCOUNTER — Ambulatory Visit: Payer: Self-pay | Admitting: Podiatry

## 2018-10-28 DIAGNOSIS — M79674 Pain in right toe(s): Secondary | ICD-10-CM

## 2018-10-28 DIAGNOSIS — M79675 Pain in left toe(s): Secondary | ICD-10-CM

## 2018-10-28 DIAGNOSIS — B351 Tinea unguium: Secondary | ICD-10-CM

## 2018-10-28 NOTE — Progress Notes (Signed)
Pt presents with mycotic infection of nails 1-5 bilateral.  All other systems are negative  Trimmed and filed nails 1-5 bilateral. Laser therapy administered to affected nails and tolerated well. All safety precautions were in place.  Follow up in 4 weeks for 3rd laser treatment.

## 2018-11-23 ENCOUNTER — Encounter: Payer: Self-pay | Admitting: Gynecology

## 2018-11-25 ENCOUNTER — Ambulatory Visit: Payer: Self-pay

## 2018-11-25 ENCOUNTER — Other Ambulatory Visit: Payer: Self-pay

## 2018-11-25 DIAGNOSIS — M79674 Pain in right toe(s): Secondary | ICD-10-CM

## 2018-11-25 DIAGNOSIS — B351 Tinea unguium: Secondary | ICD-10-CM

## 2018-11-25 NOTE — Progress Notes (Signed)
Pt presents with mycotic infection of nails 1-5 bilateral.  All other systems are negative  Laser therapy administered to affected nails and tolerated well. All safety precautions were in place.  2nd treatment.  Follow up in 4 weeks     

## 2018-11-29 ENCOUNTER — Ambulatory Visit (INDEPENDENT_AMBULATORY_CARE_PROVIDER_SITE_OTHER): Payer: Medicare Other

## 2018-11-29 ENCOUNTER — Other Ambulatory Visit: Payer: Self-pay

## 2018-11-29 DIAGNOSIS — Z23 Encounter for immunization: Secondary | ICD-10-CM

## 2018-12-20 ENCOUNTER — Ambulatory Visit: Payer: Medicare Other | Admitting: Podiatry

## 2019-01-03 ENCOUNTER — Encounter: Payer: Self-pay | Admitting: Gynecology

## 2019-01-03 LAB — HM MAMMOGRAPHY

## 2019-01-04 ENCOUNTER — Encounter: Payer: Self-pay | Admitting: Podiatry

## 2019-01-04 ENCOUNTER — Other Ambulatory Visit: Payer: Self-pay

## 2019-01-04 ENCOUNTER — Ambulatory Visit: Payer: Medicare Other | Admitting: Podiatry

## 2019-01-04 DIAGNOSIS — B351 Tinea unguium: Secondary | ICD-10-CM

## 2019-01-04 DIAGNOSIS — M79671 Pain in right foot: Secondary | ICD-10-CM | POA: Diagnosis not present

## 2019-01-04 DIAGNOSIS — M79674 Pain in right toe(s): Secondary | ICD-10-CM

## 2019-01-04 DIAGNOSIS — L84 Corns and callosities: Secondary | ICD-10-CM | POA: Diagnosis not present

## 2019-01-04 DIAGNOSIS — M79675 Pain in left toe(s): Secondary | ICD-10-CM

## 2019-01-04 NOTE — Patient Instructions (Signed)

## 2019-01-06 ENCOUNTER — Ambulatory Visit (INDEPENDENT_AMBULATORY_CARE_PROVIDER_SITE_OTHER): Payer: Self-pay | Admitting: *Deleted

## 2019-01-06 ENCOUNTER — Other Ambulatory Visit: Payer: Self-pay

## 2019-01-06 DIAGNOSIS — M79675 Pain in left toe(s): Secondary | ICD-10-CM

## 2019-01-06 DIAGNOSIS — B351 Tinea unguium: Secondary | ICD-10-CM

## 2019-01-06 DIAGNOSIS — M79674 Pain in right toe(s): Secondary | ICD-10-CM

## 2019-01-06 NOTE — Progress Notes (Signed)
Patient presents today for the 4th laser treatment. Diagnosed with mycotic nail infection by Dr. Elisha Ponder.   All other systems are negative.  Nails were filed thin. Laser therapy was administered to 1-5 toenails bilateral and patient tolerated the treatment well. All safety precautions were in place.   Follow up in 4 weeks for laser # 5.  She will see Dr. Elisha Ponder again mid February for her regular nail trims.

## 2019-01-11 NOTE — Progress Notes (Signed)
Subjective: Brianna Mack presents to clinic with cc of painful mycotic toenails and callus of right foot  which are aggravated when weightbearing with and without shoe gear.  This pain limits her daily activities. Pain symptoms resolve with periodic professional debridement.  Ms. Brief is currently undergoing laser treatment for mycotic toenails and she notices improvement. Her next treatment is 01/06/2019.  Brianna Mack, Rayford Halsted, MD is her PCP.   Medications reviewed in chart.  Allergies  Allergen Reactions  . Augmentin [Amoxicillin-Pot Clavulanate] Itching and Swelling    Headache  . Cephalexin Diarrhea  . Codeine Sulfate     REACTION: nausea, vomiting  . Fluconazole Itching and Swelling    REACTION: swelling and itching  . Morphine     REACTION: nausea, itching  . Oxycodone Nausea Only  . Sulfonamide Derivatives     REACTION: rash  . Amoxicillin Rash  . Cyclobenzaprine     Per patient makes her "very groggy"-unlike usual effect  . Other Swelling    Pt states shes also allergic to an antibiotic but does not know the name or have her list with her today, this antibiotic causes face swelling.     Objective: There were no vitals filed for this visit.  Physical Examination:  Neurovascular status intact and unchanged BLE from previous visit.  Dermatological Examination: Skin with normal turgor, texture and tone b/l.  No open wounds b/l.  No interdigital macerations noted b/l.  Elongated, thick, discolored brittle toenails with subungual debris and pain on dorsal palpation of nailbeds 1-5 b/l. Clearing proximal 1/5 of nailplates.   Hyperkeratotic lesion submet head 5 left foot with tenderness to palpation. No edema, no erythema, no drainage, no flocculence.   Musculoskeletal Examination: Muscle strength 5/5 to all muscle groups b/l.  No pain, crepitus or joint discomfort with active/passive ROM.  Assessment: 1. Mycotic nail infection with pain 1-5  b/l 2. Callus submet head 5 right foot  Plan: 1. Toenails 1-5 b/l were debrided in length and girth without iatrogenic laceration. Improving with laser therapy. Continue laser therapy. 2. Calluses pared submetatarsal head 5 right foot utilizing sterile scalpel blade without incident. 3. Continue soft, supportive shoe gear daily. 4. Report any pedal injuries to medical professional. 5. Follow up 3 months. 6. Patient/POA to call should there be a question/concern in there interim.

## 2019-01-17 ENCOUNTER — Other Ambulatory Visit: Payer: Self-pay

## 2019-01-18 ENCOUNTER — Ambulatory Visit (INDEPENDENT_AMBULATORY_CARE_PROVIDER_SITE_OTHER): Payer: Medicare Other | Admitting: Gynecology

## 2019-01-18 ENCOUNTER — Encounter: Payer: Self-pay | Admitting: Gynecology

## 2019-01-18 VITALS — BP 122/76 | Ht 62.0 in | Wt 179.0 lb

## 2019-01-18 DIAGNOSIS — N952 Postmenopausal atrophic vaginitis: Secondary | ICD-10-CM

## 2019-01-18 DIAGNOSIS — F341 Dysthymic disorder: Secondary | ICD-10-CM

## 2019-01-18 DIAGNOSIS — Z01419 Encounter for gynecological examination (general) (routine) without abnormal findings: Secondary | ICD-10-CM

## 2019-01-18 MED ORDER — CITALOPRAM HYDROBROMIDE 20 MG PO TABS: 20.0000 mg | ORAL_TABLET | Freq: Every day | ORAL | 4 refills | Status: DC

## 2019-01-18 MED ORDER — ALPRAZOLAM 0.25 MG PO TABS: 0.2500 mg | ORAL_TABLET | Freq: Every evening | ORAL | 4 refills | Status: DC | PRN

## 2019-01-18 NOTE — Patient Instructions (Signed)
Follow-up in 1 year for annual exam, sooner if any issues. 

## 2019-01-18 NOTE — Addendum Note (Signed)
Addended by: Anastasio Auerbach on: 01/18/2019 03:05 PM   Modules accepted: Orders

## 2019-01-18 NOTE — Progress Notes (Signed)
    Brianna [facility] October 07, 1945 [id number]        73 y.o.  G0P0000 for annual gynecologic exam.  Without gynecologic complaints.  Has weaned off of estrogen and has done well without significant menopausal symptoms  Past medical history,surgical history, problem list, medications, allergies, family history and social history were all reviewed and documented as reviewed in the EPIC chart.  ROS:  Performed with pertinent positives and negatives included in the history, assessment and plan.   Additional significant findings : None   Exam: Caryn Bee assistant Vitals:   01/18/19 1410  BP: 122/76  Weight: 179 lb (81.2 kg)  Height: 5\' 2"  (1.575 m)   Body mass index is 32.74 kg/m.  General appearance:  Normal affect, orientation and appearance. Skin: Grossly normal HEENT: Without gross lesions.  No cervical or supraclavicular adenopathy. Thyroid normal.  Lungs:  Clear without wheezing, rales or rhonchi Cardiac: RR, without RMG Abdominal:  Soft, nontender, without masses, guarding, rebound, organomegaly or hernia Breasts:  Examined lying and sitting without masses, retractions, discharge or axillary adenopathy. Pelvic:  Ext, BUS, Vagina: With atrophic changes  Adnexa: Without masses or tenderness    Anus and perineum: Normal   Rectovaginal: Normal sphincter tone without palpated masses or tenderness.    Assessment/Plan:  73 y.o. G0P0000 female for annual gynecologic exam.   1. Postmenopausal.  Status post TAH/BSO for endometriosis and leiomyoma 2001.  Weaned off of estradiol and doing well without significant menopausal symptoms. 2. History of HSV diagnosed 2017.  Occasional outbreaks.  Uses Valtrex 500 mg twice daily for several days with outbreaks.  Has supply but will call when needs more. 3. Anxiety and depression.  Uses Celexa 20 mg daily and Xanax 0.25 mg as needed with good results.  Would like to continue for now.  Refill Celexa x1 year.  Xanax No. 30 with 4 refills provided.  4. Mammography 12/2018.  Continue with annual mammography next year.  Breast exam normal today. 5. DEXA 2018 normal.  Plan repeat DEXA at 5-year interval. 6. Colonoscopy 2019.  Repeat at their recommended interval. 7. Pap smear 2016.  No Pap smear done today.  No history of abnormal Pap smears.  We both agree to stop screening per current screening guidelines. 8. Health maintenance.  No routine lab work done as patient does this elsewhere.  Follow-up 1 year, sooner as needed.   Anastasio Auerbach MD, 2:36 PM 01/18/2019

## 2019-02-03 ENCOUNTER — Other Ambulatory Visit: Payer: Medicare Other

## 2019-02-13 ENCOUNTER — Ambulatory Visit (INDEPENDENT_AMBULATORY_CARE_PROVIDER_SITE_OTHER): Payer: Medicare Other | Admitting: *Deleted

## 2019-02-13 ENCOUNTER — Other Ambulatory Visit: Payer: Self-pay

## 2019-02-13 DIAGNOSIS — M79675 Pain in left toe(s): Secondary | ICD-10-CM

## 2019-02-13 DIAGNOSIS — B351 Tinea unguium: Secondary | ICD-10-CM

## 2019-02-13 DIAGNOSIS — M79674 Pain in right toe(s): Secondary | ICD-10-CM

## 2019-02-13 NOTE — Progress Notes (Signed)
Patient presents today for the 5th laser treatment. Diagnosed with mycotic nail infection by Dr. Elisha Ponder.   All other systems are negative.  Nails were filed thin. Laser therapy was administered to 1-5 toenails bilateral and patient tolerated the treatment well. All safety precautions were in place.   Follow up in 4 weeks for laser # 6 and final treatment.  She will see Dr. Elisha Ponder mid February for her regular nail trim visit. She may want to wait until March so nails can grow out.

## 2019-02-16 ENCOUNTER — Encounter: Payer: Self-pay | Admitting: Internal Medicine

## 2019-02-27 ENCOUNTER — Ambulatory Visit: Payer: Medicare Other | Attending: Internal Medicine

## 2019-02-27 DIAGNOSIS — Z20822 Contact with and (suspected) exposure to covid-19: Secondary | ICD-10-CM

## 2019-02-28 LAB — NOVEL CORONAVIRUS, NAA: SARS-CoV-2, NAA: NOT DETECTED

## 2019-03-10 ENCOUNTER — Telehealth: Payer: Self-pay | Admitting: Internal Medicine

## 2019-03-10 NOTE — Telephone Encounter (Signed)
Probably a lymph node. Advise observation and to schedule in person visit if still present in 2 weeks. Needs to be covid negative and free of symptoms.

## 2019-03-10 NOTE — Telephone Encounter (Signed)
Pt wants to inform Dr. Rhona Raider. She has a gland under her chin that is swollen  size of an egg. She has an appt to have a covid test; on 03-10-2018.  she wants to know if this is something that the Dr should look at and if she needs to come in to be elevated; please advise and question the CMA can call her back at [phone]

## 2019-03-10 NOTE — Telephone Encounter (Signed)
Patient is aware and will call back if needed.

## 2019-03-11 ENCOUNTER — Ambulatory Visit: Payer: Medicare Other | Attending: Internal Medicine

## 2019-03-11 DIAGNOSIS — Z23 Encounter for immunization: Secondary | ICD-10-CM | POA: Insufficient documentation

## 2019-03-11 NOTE — Progress Notes (Signed)
   Covid-19 Vaccination Clinic  Name:  Mabell Dever    MRN: [id number] DOB: September 19, 1945  03/11/2019  Ms. Cavallaro was observed post Covid-19 immunization for 15 minutes without incidence. She was provided with Vaccine Information Sheet and instruction to access the V-Safe system.   Ms. Ravitz was instructed to call 911 with any severe reactions post vaccine: Marland Kitchen Difficulty breathing  . Swelling of your face and throat  . A fast heartbeat  . A bad rash all over your body  . Dizziness and weakness    Immunizations Administered    Name Date Dose VIS Date Route   Pfizer COVID-19 Vaccine 03/11/2019 12:50 PM 0.3 mL 01/27/2019 Intramuscular   Manufacturer: [place]   Lot: [id number]   [place]: [id number]

## 2019-03-13 ENCOUNTER — Ambulatory Visit: Payer: Medicare Other

## 2019-03-27 ENCOUNTER — Other Ambulatory Visit: Payer: Self-pay

## 2019-03-27 ENCOUNTER — Ambulatory Visit (INDEPENDENT_AMBULATORY_CARE_PROVIDER_SITE_OTHER): Payer: Medicare Other | Admitting: *Deleted

## 2019-03-27 DIAGNOSIS — B351 Tinea unguium: Secondary | ICD-10-CM

## 2019-03-27 DIAGNOSIS — M79675 Pain in left toe(s): Secondary | ICD-10-CM

## 2019-03-27 DIAGNOSIS — M79674 Pain in right toe(s): Secondary | ICD-10-CM

## 2019-03-27 NOTE — Progress Notes (Signed)
Patient presents today for the 6th and final laser treatment. Diagnosed with mycotic nail infection by Dr. Elisha Ponder. The toenails are clearing.  All other systems are negative.  No filing done to the nails today. Laser therapy was administered to 1-5 toenails bilateral and patient tolerated the treatment well. All safety precautions were in place.   She will follow up with Dr. Elisha Ponder at her already scheduled appointment next Friday.

## 2019-04-01 ENCOUNTER — Ambulatory Visit: Payer: Medicare Other | Attending: Internal Medicine

## 2019-04-01 DIAGNOSIS — Z23 Encounter for immunization: Secondary | ICD-10-CM | POA: Insufficient documentation

## 2019-04-01 NOTE — Progress Notes (Signed)
   Covid-19 Vaccination Clinic  Name:  Brianna Mack    MRN: [id number] DOB: [birth year]  04/01/2019  [name] was observed post Covid-19 immunization for 15 minutes without incidence. She was provided with Vaccine Information Sheet and instruction to access the V-Safe system.   [name] was instructed to call 911 with any severe reactions post vaccine: Marland Kitchen Difficulty breathing  . Swelling of your face and throat  . A fast heartbeat  . A bad rash all over your body  . Dizziness and weakness    Immunizations Administered    Name Date Dose VIS Date Route   Pfizer COVID-19 Vaccine 04/01/2019 11:55 AM 0.3 mL 01/27/2019 Intramuscular   Manufacturer: [place]   Lot: [id number]   [place]: [id number]

## 2019-04-07 ENCOUNTER — Other Ambulatory Visit: Payer: Self-pay

## 2019-04-07 ENCOUNTER — Ambulatory Visit: Payer: Medicare Other | Admitting: Podiatry

## 2019-04-07 ENCOUNTER — Encounter: Payer: Self-pay | Admitting: Podiatry

## 2019-04-07 DIAGNOSIS — M79675 Pain in left toe(s): Secondary | ICD-10-CM

## 2019-04-07 DIAGNOSIS — Q828 Other specified congenital malformations of skin: Secondary | ICD-10-CM

## 2019-04-07 DIAGNOSIS — M79674 Pain in right toe(s): Secondary | ICD-10-CM

## 2019-04-07 DIAGNOSIS — M79671 Pain in right foot: Secondary | ICD-10-CM

## 2019-04-07 DIAGNOSIS — B351 Tinea unguium: Secondary | ICD-10-CM

## 2019-04-07 DIAGNOSIS — M79672 Pain in left foot: Secondary | ICD-10-CM

## 2019-04-07 NOTE — Patient Instructions (Signed)
Corns and Calluses Corns are small areas of thickened skin that occur on the top, sides, or tip of a toe. They contain a cone-shaped core with a point that can press on a nerve below. This causes pain.  Calluses are areas of thickened skin that can occur anywhere on the body, including the hands, fingers, palms, soles of the feet, and heels. Calluses are usually larger than corns. What are the causes? Corns and calluses are caused by rubbing (friction) or pressure, such as from shoes that are too tight or do not fit properly. What increases the risk? Corns are more likely to develop in people who have misshapen toes (toe deformities), such as hammer toes. Calluses can occur with friction to any area of the skin. They are more likely to develop in people who:  Work with their hands.  Wear shoes that fit poorly, are too tight, or are high-heeled.  Have toe deformities. What are the signs or symptoms? Symptoms of a corn or callus include:  A hard growth on the skin.  Pain or tenderness under the skin.  Redness and swelling.  Increased discomfort while wearing tight-fitting shoes, if your feet are affected. If a corn or callus becomes infected, symptoms may include:  Redness and swelling that gets worse.  Pain.  Fluid, blood, or pus draining from the corn or callus. How is this diagnosed? Corns and calluses may be diagnosed based on your symptoms, your medical history, and a physical exam. How is this treated? Treatment for corns and calluses may include:  Removing the cause of the friction or pressure. This may involve: ? Changing your shoes. ? Wearing shoe inserts (orthotics) or other protective layers in your shoes, such as a corn pad. ? Wearing gloves.  Applying medicine to the skin (topical medicine) to help soften skin in the hardened, thickened areas.  Removing layers of dead skin with a file to reduce the size of the corn or callus.  Removing the corn or callus with a  scalpel or laser.  Taking antibiotic medicines, if your corn or callus is infected.  Having surgery, if a toe deformity is the cause. Follow these instructions at home:   Take over-the-counter and prescription medicines only as told by your health care provider.  If you were prescribed an antibiotic, take it as told by your health care provider. Do not stop taking it even if your condition starts to improve.  Wear shoes that fit well. Avoid wearing high-heeled shoes and shoes that are too tight or too loose.  Wear any padding, protective layers, gloves, or orthotics as told by your health care provider.  Soak your hands or feet and then use a file or pumice stone to soften your corn or callus. Do this as told by your health care provider.  Check your corn or callus every day for symptoms of infection. Contact a health care provider if you:  Notice that your symptoms do not improve with treatment.  Have redness or swelling that gets worse.  Notice that your corn or callus becomes painful.  Have fluid, blood, or pus coming from your corn or callus.  Have new symptoms. Summary  Corns are small areas of thickened skin that occur on the top, sides, or tip of a toe.  Calluses are areas of thickened skin that can occur anywhere on the body, including the hands, fingers, palms, and soles of the feet. Calluses are usually larger than corns.  Corns and calluses are caused by   rubbing (friction) or pressure, such as from shoes that are too tight or do not fit properly.  Treatment may include wearing any padding, protective layers, gloves, or orthotics as told by your health care provider. This information is not intended to replace advice given to you by your health care provider. Make sure you discuss any questions you have with your health care provider. Document Revised: 05/25/2018 Document Reviewed: 12/16/2016 Elsevier Patient Education  2020 [employer]  Onychomycosis/Fungal  Toenails  WHAT IS IT? An infection that lies within the keratin of your nail plate that is caused by a fungus.  WHY ME? Fungal infections affect all ages, sexes, races, and creeds.  There may be many factors that predispose you to a fungal infection such as age, coexisting medical conditions such as diabetes, or an autoimmune disease; stress, medications, fatigue, genetics, etc.  Bottom line: fungus thrives in a warm, moist environment and your shoes offer such a location.  IS IT CONTAGIOUS? Theoretically, yes.  You do not want to share shoes, nail clippers or files with someone who has fungal toenails.  Walking around barefoot in the same room or sleeping in the same bed is unlikely to transfer the organism.  It is important to realize, however, that fungus can spread easily from one nail to the next on the same foot.  HOW DO WE TREAT THIS?  There are several ways to treat this condition.  Treatment may depend on many factors such as age, medications, pregnancy, liver and kidney conditions, etc.  It is best to ask your doctor which options are available to you.  4. No treatment.   Unlike many other medical concerns, you can live with this condition.  However for many people this can be a painful condition and may lead to ingrown toenails or a bacterial infection.  It is recommended that you keep the nails cut short to help reduce the amount of fungal nail. 5. Topical treatment.  These range from herbal remedies to prescription strength nail lacquers.  About 40-50% effective, topicals require twice daily application for approximately 9 to 12 months or until an entirely new nail has grown out.  The most effective topicals are medical grade medications available through physicians offices. 6. Oral antifungal medications.  With an 80-90% cure rate, the most common oral medication requires 3 to 4 months of therapy and stays in your system for a year as the new nail grows out.  Oral antifungal medications do  require blood work to make sure it is a safe drug for you.  A liver function panel will be performed prior to starting the medication and after the first month of treatment.  It is important to have the blood work performed to avoid any harmful side effects.  In general, this medication safe but blood work is required. 7. Laser Therapy.  This treatment is performed by applying a specialized laser to the affected nail plate.  This therapy is noninvasive, fast, and non-painful.  It is not covered by insurance and is therefore, out of pocket.  The results have been very good with a 80-95% cure rate.  [facility] is the only practice in the area to offer this therapy. 8. Permanent Nail Avulsion.  Removing the entire nail so that a new nail will not grow back. 

## 2019-04-13 NOTE — Progress Notes (Addendum)
Subjective: Brianna Mack presents today for follow up of callus(es) plantar aspect of both feet and painful mycotic toenails b/l that are difficult to trim. Pain interferes with ambulation. Aggravating factors include wearing enclosed shoe gear. Pain is relieved with periodic professional debridement.   She has been undergoing laser therapy for treatment of mycotic toenails. She is pleased with the appearance of her toenails.   She voices no new pedal problems on today's visit.  Allergies  Allergen Reactions  . Augmentin [Amoxicillin-Pot Clavulanate] Itching and Swelling    Headache  . Cephalexin Diarrhea  . Codeine Sulfate     REACTION: nausea, vomiting  . Fluconazole Itching and Swelling    REACTION: swelling and itching  . Morphine     REACTION: nausea, itching  . Oxycodone Nausea Only  . Sulfonamide Derivatives     REACTION: rash  . Amoxicillin Rash  . Cyclobenzaprine     Per patient makes her "very groggy"-unlike usual effect  . Other Swelling    Pt states shes also allergic to an antibiotic but does not know the name or have her list with her today, this antibiotic causes face swelling.     Objective: There were no vitals filed for this visit.  Vascular Examination:  Capillary refill time to digits immediate b/l, palpable DP pulses b/l, palpable PT pulses b/l, pedal hair sparse b/l and skin temperature gradient within normal limits b/l  Dermatological Examination: Pedal skin with normal turgor, texture and tone bilaterally, toenails 1-5 b/l elongated, dystrophic, thickened, crumbly with subungual debris, there is improvement in appearance of proximal 2/3 of nailplates 1-5 b/l and porokeratotic lesion(s) submet head 5 b/l. No erythema, no edema, no drainage, no flocculence  Musculoskeletal: Normal muscle strength 5/5 to all lower extremity muscle groups bilaterally, no gross bony deformities bilaterally, no pain crepitus or joint limitation noted with ROM b/l and patient  ambulates independent of any assistive aids  Neurological: Protective sensation intact 5/5 intact bilaterally with 10g monofilament b/l and vibratory sensation intact b/l  Assessment: 1. Pain due to onychomycosis of toenails of both feet   2. Porokeratosis   3. Pain in both feet    Plan: -Toenails 1-5 b/l were debrided in length and girth with sterile nail nippers and dremel without iatrogenic bleeding. She has had significant improvement in appearance of her toenails and I have discussed letting the remaining affected nails grow out and we will reassess at next visit. -We will discontinue laser therapy for now. -Painful porokeratotic lesions submet head 5 b/l pared and enucleated with sterile scalpel blade. -Patient to continue soft, supportive shoe gear daily. -Patient to report any pedal injuries to medical professional immediately. -Patient/POA to call should there be question/concern in the interim.  Return in about 3 months (around 07/05/2019) for nail and callus trim.

## 2019-05-04 DIAGNOSIS — K219 Gastro-esophageal reflux disease without esophagitis: Secondary | ICD-10-CM | POA: Insufficient documentation

## 2019-07-07 ENCOUNTER — Other Ambulatory Visit: Payer: Self-pay

## 2019-07-07 ENCOUNTER — Ambulatory Visit: Payer: Medicare Other | Admitting: Podiatry

## 2019-07-07 ENCOUNTER — Encounter: Payer: Self-pay | Admitting: Podiatry

## 2019-07-07 DIAGNOSIS — Q828 Other specified congenital malformations of skin: Secondary | ICD-10-CM | POA: Diagnosis not present

## 2019-07-07 DIAGNOSIS — M79671 Pain in right foot: Secondary | ICD-10-CM

## 2019-07-07 DIAGNOSIS — B351 Tinea unguium: Secondary | ICD-10-CM | POA: Diagnosis not present

## 2019-07-07 DIAGNOSIS — M79672 Pain in left foot: Secondary | ICD-10-CM | POA: Diagnosis not present

## 2019-07-07 DIAGNOSIS — M79675 Pain in left toe(s): Secondary | ICD-10-CM

## 2019-07-07 DIAGNOSIS — M79674 Pain in right toe(s): Secondary | ICD-10-CM

## 2019-07-07 NOTE — Patient Instructions (Signed)

## 2019-07-14 NOTE — Progress Notes (Signed)
Subjective: Brianna Mack is a 74 y.o. female patient seen today painful porokeratotic lesion(s) b/l and painful mycotic toenails b/l that limit ambulation. Aggravating factors include weightbearing with and without shoe gear. Pain for both is relieved with periodic professional debridement.   She has completed laser therapy for her mycotic toenails.  Patient Active Problem List   Diagnosis Date Noted  . Pain in left knee 03/17/2018  . Irritable bowel syndrome 12/15/2016  . Fracture dislocation of shoulder joint 06/27/2014  . ANXIETY DEPRESSION 03/26/2009    Current Outpatient Medications on File Prior to Visit  Medication Sig Dispense Refill  . acetaminophen (TYLENOL) 500 MG tablet Take 1 tablet (500 mg total) by mouth every 6 (six) hours as needed for mild pain or moderate pain. 30 tablet 0  . ALPRAZolam (XANAX) 0.25 MG tablet Take 1 tablet (0.25 mg total) by mouth at bedtime as needed for anxiety. 30 tablet 4  . Ascorbic Acid (VITAMIN C PO) Take by mouth.    Marland Kitchen ascorbic acid (VITAMIN C) 100 MG tablet Take by mouth.    . Ca Phosphate-Cholecalciferol (CALTRATE GUMMY BITES PO) Citracal-D3 Gummies    . Calcium Carbonate-Vitamin D (CALCIUM + D PO) Take 1 tablet by mouth daily.     . cetirizine (ZYRTEC) 10 MG tablet Take by mouth.    . citalopram (CELEXA) 20 MG tablet Take 1 tablet (20 mg total) by mouth daily. 90 tablet 4  . clobetasol cream (TEMOVATE) 0.05 %     . hyoscyamine (LEVBID) 0.375 MG 12 hr tablet Take 0.375 mg by mouth daily.     . valACYclovir (VALTREX) 500 MG tablet Take 1 tablet (500 mg total) by mouth 2 (two) times daily. 30 tablet 3   No current facility-administered medications on file prior to visit.    Allergies  Allergen Reactions  . Augmentin [Amoxicillin-Pot Clavulanate] Itching and Swelling    Headache  . Cephalexin Diarrhea  . Codeine Sulfate     REACTION: nausea, vomiting  . Fluconazole Itching and Swelling    REACTION: swelling and itching  . Morphine      REACTION: nausea, itching  . Oxycodone Nausea Only  . Sulfonamide Derivatives     REACTION: rash  . Amoxicillin Rash  . Cyclobenzaprine     Per patient makes her "very groggy"-unlike usual effect  . Other Swelling    Pt states shes also allergic to an antibiotic but does not know the name or have her list with her today, this antibiotic causes face swelling.    Objective: Physical Exam  General: Brianna Mack is a pleasant 74 y.o. Caucasian female, in NAD. AAO x 3.   Vascular:  Neurovascular status unchanged b/l. Capillary refill time to digits immediate b/l. Palpable DP pulses b/l. Palpable PT pulses b/l. Pedal hair sparse b/l. Skin temperature gradient within normal limits b/l. No edema noted b/l.  Dermatological:  Pedal skin with normal turgor, texture and tone bilaterally. No open wounds bilaterally. No interdigital macerations bilaterally. Toenails 1-5 b/l elongated, dystrophic, thickened, crumbly with subungual debris and tenderness to dorsal palpation. Distal 1/5 of all nail edges with residual mycotic subungual debris. Porokeratotic lesion(s) submet head 5 left foot and submet head 5 right foot. No erythema, no edema, no drainage, no flocculence.  Musculoskeletal:  Normal muscle strength 5/5 to all lower extremity muscle groups bilaterally. No gross bony deformities bilaterally. No pain crepitus or joint limitation noted with ROM b/l. Patient ambulates independent of any assistive aids.  Neurological:  Protective  sensation intact 5/5 intact bilaterally with 10g monofilament b/l. Vibratory sensation intact b/l. Proprioception intact bilaterally.  Assessment and Plan:  1. Pain due to onychomycosis of toenails of both feet   2. Porokeratosis   3. Pain in both feet    -Examined patient. -Toenails 1-5 b/l were debrided in length and girth with sterile nail nippers and dremel without iatrogenic bleeding.  -Painful porokeratotic lesion(s) submet head 5 left foot and submet  head 5 right foot pared and enucleated with sterile scalpel blade without incident. -Patient to continue soft, supportive shoe gear daily. -Patient to report any pedal injuries to medical professional immediately. -Patient/POA to call should there be question/concern in the interim.  Return in about 3 months (around 10/07/2019) for nail and callus trim.  Marzetta Board, DPM

## 2019-07-18 ENCOUNTER — Other Ambulatory Visit: Payer: Self-pay

## 2019-07-18 ENCOUNTER — Encounter: Payer: Self-pay | Admitting: Internal Medicine

## 2019-07-18 ENCOUNTER — Ambulatory Visit (INDEPENDENT_AMBULATORY_CARE_PROVIDER_SITE_OTHER): Payer: Medicare Other | Admitting: Internal Medicine

## 2019-07-18 VITALS — BP 120/78 | HR 73 | Temp 97.3°F | Ht 62.75 in | Wt 184.3 lb

## 2019-07-18 DIAGNOSIS — E785 Hyperlipidemia, unspecified: Secondary | ICD-10-CM | POA: Insufficient documentation

## 2019-07-18 DIAGNOSIS — Z0000 Encounter for general adult medical examination without abnormal findings: Secondary | ICD-10-CM

## 2019-07-18 DIAGNOSIS — K588 Other irritable bowel syndrome: Secondary | ICD-10-CM

## 2019-07-18 DIAGNOSIS — F341 Dysthymic disorder: Secondary | ICD-10-CM

## 2019-07-18 DIAGNOSIS — E669 Obesity, unspecified: Secondary | ICD-10-CM | POA: Diagnosis not present

## 2019-07-18 DIAGNOSIS — R7302 Impaired glucose tolerance (oral): Secondary | ICD-10-CM | POA: Insufficient documentation

## 2019-07-18 LAB — COMPREHENSIVE METABOLIC PANEL
ALT: 12 U/L (ref 0–35)
AST: 16 U/L (ref 0–37)
Albumin: 4.2 g/dL (ref 3.5–5.2)
Alkaline Phosphatase: 79 U/L (ref 39–117)
BUN: 19 mg/dL (ref 6–23)
CO2: 29 mEq/L (ref 19–32)
Calcium: 9.5 mg/dL (ref 8.4–10.5)
Chloride: 103 mEq/L (ref 96–112)
Creatinine, Ser: 0.69 mg/dL (ref 0.40–1.20)
GFR: 83.41 mL/min (ref >60.00)
Glucose, Bld: 77 mg/dL (ref 70–99)
Potassium: 4.1 mEq/L (ref 3.5–5.1)
Sodium: 140 mEq/L (ref 135–145)
Total Bilirubin: 0.4 mg/dL (ref 0.2–1.2)
Total Protein: 6.7 g/dL (ref 6.0–8.3)

## 2019-07-18 LAB — LIPID PANEL
Cholesterol: 234 mg/dL — ABNORMAL HIGH (ref 0–200)
HDL: 68 mg/dL (ref >39.00)
LDL Cholesterol: 151 mg/dL — ABNORMAL HIGH (ref 0–99)
NonHDL: 166.46
Total CHOL/HDL Ratio: 3
Triglycerides: 79 mg/dL (ref 0.0–149.0)
VLDL: 15.8 mg/dL (ref 0.0–40.0)

## 2019-07-18 LAB — CBC WITH DIFFERENTIAL/PLATELET
Basophils Absolute: 0.1 10*3/uL (ref 0.0–0.1)
Basophils Relative: 1.8 % (ref 0.0–3.0)
Eosinophils Absolute: 0.2 10*3/uL (ref 0.0–0.7)
Eosinophils Relative: 3.7 % (ref 0.0–5.0)
HCT: 40.4 % (ref 36.0–46.0)
Hemoglobin: 13.3 g/dL (ref 12.0–15.0)
Lymphocytes Relative: 26.7 % (ref 12.0–46.0)
Lymphs Abs: 1.5 10*3/uL (ref 0.7–4.0)
MCHC: 32.8 g/dL (ref 30.0–36.0)
MCV: 89.5 fl (ref 78.0–100.0)
Monocytes Absolute: 0.5 10*3/uL (ref 0.1–1.0)
Monocytes Relative: 8.5 % (ref 3.0–12.0)
Neutro Abs: 3.4 10*3/uL (ref 1.4–7.7)
Neutrophils Relative %: 59.3 % (ref 43.0–77.0)
Platelets: 283 10*3/uL (ref 150.0–400.0)
RBC: 4.51 Mil/uL (ref 3.87–5.11)
RDW: 14.9 % (ref 11.5–15.5)
WBC: 5.8 10*3/uL (ref 4.0–10.5)

## 2019-07-18 LAB — HEMOGLOBIN A1C: Hgb A1c MFr Bld: 5.8 % (ref 4.6–6.5)

## 2019-07-18 LAB — VITAMIN B12: Vitamin B-12: 277 pg/mL (ref 211–911)

## 2019-07-18 LAB — TSH: TSH: 3.76 u[IU]/mL (ref 0.35–4.50)

## 2019-07-18 LAB — VITAMIN D 25 HYDROXY (VIT D DEFICIENCY, FRACTURES): VITD: 35.82 ng/mL (ref 30.00–100.00)

## 2019-07-18 NOTE — Progress Notes (Signed)
Established Patient Office Visit     This visit occurred during the SARS-CoV-2 public health emergency.  Safety protocols were in place, including screening questions prior to the visit, additional usage of staff PPE, and extensive cleaning of exam room while observing appropriate contact time as indicated for disinfecting solutions.    CC/Reason for Visit: Annual preventive exam and subsequent Medicare wellness visit  HPI: Brianna Mack is a 74 y.o. female who is coming in today for the above mentioned reasons. Past Medical History is significant for: Depression and anxiety ever since the death of her husband about 3 years ago.  She has been coping well and remains active in the community.  She had joined a fitness center precoated and had been working out, none since the pandemic.  She has good family and friend support.  She also has a history of irritable bowel syndrome and sees Dr. Jaci Carrel on a yearly basis.  She tells me that she has been dealing with mouth ulcers.  Her dentist thought it was vitamin C deficiency and she has improved with 1000 mg of vitamin C daily.  She had a biopsy which returned as "undetermined".  She has routine eye and dental care.  She has slight decrease in hearing but has been told by ENT that is not significant enough for hearing aids.  She walks about 45 minutes every day with her dog.  She has had both of her Covid vaccines, has not received shingles.   Past Medical/Surgical History: Past Medical History:  Diagnosis Date  . Anxiety   . Arthritis   . Depression   . HSV (herpes simplex virus) anogenital infection 11/2015  . IBS (irritable bowel syndrome)   . Stress fracture     Past Surgical History:  Procedure Laterality Date  . ABDOMINAL HYSTERECTOMY  2001   BSO ; SLING PROCEDURE endometriosis/leiomyomata  . CATARACT EXTRACTION, BILATERAL    . CHOLECYSTECTOMY  1976  . DILATION AND CURETTAGE OF UTERUS    . HAND SURGERY  04-02-2011   right  .  OOPHORECTOMY     BSO  . ORIF SHOULDER FRACTURE Right 06/28/2014   Procedure: RIGHT SHOULDER HEMI-ARTHROPLASTY;  Surgeon: Netta Cedars, MD;  Location: [facility];  Service: Orthopedics;  Laterality: Right;  . TONSILLECTOMY  1954    Social History:  reports that she quit smoking about 41 years ago. She has never used smokeless tobacco. She reports current alcohol use. She reports that she does not use drugs.  Allergies: Allergies  Allergen Reactions  . Augmentin [Amoxicillin-Pot Clavulanate] Itching and Swelling    Headache  . Cephalexin Diarrhea  . Codeine Sulfate     REACTION: nausea, vomiting  . Fluconazole Itching and Swelling    REACTION: swelling and itching  . Morphine     REACTION: nausea, itching  . Oxycodone Nausea Only  . Sulfonamide Derivatives     REACTION: rash  . Amoxicillin Rash  . Cyclobenzaprine     Per patient makes her "very groggy"-unlike usual effect  . Other Swelling    Pt states shes also allergic to an antibiotic but does not know the name or have her list with her today, this antibiotic causes face swelling.    Family History:  Family History  Problem Relation Age of Onset  . Diabetes Mother   . Hypertension Mother   . Cancer Mother        Laurell Roof  . COPD Father      Current Outpatient  Medications:  .  acetaminophen (TYLENOL) 500 MG tablet, Take 1 tablet (500 mg total) by mouth every 6 (six) hours as needed for mild pain or moderate pain., Disp: 30 tablet, Rfl: 0 .  ALPRAZolam (XANAX) 0.25 MG tablet, Take 1 tablet (0.25 mg total) by mouth at bedtime as needed for anxiety., Disp: 30 tablet, Rfl: 4 .  ascorbic acid (VITAMIN C) 100 MG tablet, Take by mouth., Disp: , Rfl:  .  Ca Phosphate-Cholecalciferol (CALTRATE GUMMY BITES PO), Citracal-D3 Gummies, Disp: , Rfl:  .  Calcium Carbonate-Vitamin D (CALCIUM + D PO), Take 1 tablet by mouth daily. , Disp: , Rfl:  .  cetirizine (ZYRTEC) 10 MG tablet, Take by mouth., Disp: , Rfl:  .  citalopram (CELEXA)  20 MG tablet, Take 1 tablet (20 mg total) by mouth daily., Disp: 90 tablet, Rfl: 4 .  clobetasol cream (TEMOVATE) 0.05 %, , Disp: , Rfl:  .  hyoscyamine (LEVBID) 0.375 MG 12 hr tablet, Take 0.375 mg by mouth daily. , Disp: , Rfl:  .  valACYclovir (VALTREX) 500 MG tablet, Take 1 tablet (500 mg total) by mouth 2 (two) times daily., Disp: 30 tablet, Rfl: 3  Review of Systems:  Constitutional: Denies fever, chills, diaphoresis, appetite change and fatigue.  HEENT: Denies photophobia, eye pain, redness, hearing loss, ear pain, congestion, sore throat, rhinorrhea, sneezing, mouth sores, trouble swallowing, neck pain, neck stiffness and tinnitus.   Respiratory: Denies SOB, DOE, cough, chest tightness,  and wheezing.   Cardiovascular: Denies chest pain, palpitations and leg swelling.  Gastrointestinal: Denies nausea, vomiting, abdominal pain, diarrhea, constipation, blood in stool and abdominal distention.  Genitourinary: Denies dysuria, urgency, frequency, hematuria, flank pain and difficulty urinating.  Endocrine: Denies: hot or cold intolerance, sweats, changes in hair or nails, polyuria, polydipsia. Musculoskeletal: Denies myalgias, back pain, joint swelling, arthralgias and gait problem.  Skin: Denies pallor, rash and wound.  Neurological: Denies dizziness, seizures, syncope, weakness, light-headedness, numbness and headaches.  Hematological: Denies adenopathy. Easy bruising, personal or family bleeding history  Psychiatric/Behavioral: Denies suicidal ideation, mood changes, confusion, nervousness, sleep disturbance and agitation    Physical Exam: Vitals:   07/18/19 0901  BP: 120/78  Pulse: 73  Temp: (!) 97.3 F (36.3 C)  TempSrc: Temporal  SpO2: 96%  Weight: 184 lb 4.8 oz (83.6 kg)  Height: 5' 2.75" (1.594 m)    Body mass index is 32.91 kg/m.   Constitutional: NAD, calm, comfortable Eyes: PERRL, lids and conjunctivae normal ENMT: Mucous membranes are moist.Tympanic membrane is  pearly white, no erythema or bulging. Neck: normal, supple, no masses, no thyromegaly Respiratory: clear to auscultation bilaterally, no wheezing, no crackles. Normal respiratory effort. No accessory muscle use.  Cardiovascular: Regular rate and rhythm, no murmurs / rubs / gallops. No extremity edema. 2+ pedal pulses. No carotid bruits.  Abdomen: no tenderness, no masses palpated. No hepatosplenomegaly. Bowel sounds positive.  Musculoskeletal: no clubbing / cyanosis. No joint deformity upper and lower extremities. Good ROM, no contractures. Normal muscle tone.  Skin: no rashes, lesions, ulcers. No induration Neurologic: CN 2-12 grossly intact. Sensation intact, DTR normal. Strength 5/5 in all 4.  Psychiatric: Normal judgment and insight. Alert and oriented x 3. Normal mood.    Subsequent Medicare wellness visit   1. Risk factors, based on past  M,S,F -cardiovascular disease risk factors include age only   2.  Physical activities: Walks for 45 minutes a day   3.  Depression/mood:  History of depression but not currently depressed   4.  Hearing:  Slight difficulty, does not feel like she needs hearing aids   5.  ADL's: Independent in all ADLs   6.  Fall risk:  Low fall risk   7.  Home safety: No problems identified   8.  Height weight, and visual acuity: Height and weight as above, visual acuity is 20/32 with each eye independently and 20/25 with eyes together.   9.  Counseling:  We have discussed healthy lifestyle in detail including decreasing carbs and increasing lean protein in diet and her need to obtain her shingles vaccination at her drugstore   10. Lab orders based on risk factors: Laboratory update will be reviewed   11. Referral :  None today   12. Care plan:  Follow-up with me in 6 months   13. Cognitive assessment:  No cognitive impairment   14. Screening: Patient provided with a written and personalized 5-10 year screening schedule in the AVS.   yes   15. Provider  List Update:   PCP, podiatrist  40. Advance Directives: Full code     Office Visit from 07/18/2019 in [place] at [facility]  PHQ-9 Total Score  0      Fall Risk  07/18/2019 01/21/2018 07/06/2017 04/21/2016 02/25/2016  Falls in the past year? 0 0 No Yes Yes  Number falls in past yr: 0 0 - 1 1  Injury with Fall? 0 0 - Yes Yes  Follow up - - - Education provided -     Impression and Plan:  Encounter for preventive health examination  -She has routine eye and dental care. -Immunizations are up-to-date with the exception of shingles which she states she will receive at her local pharmacy. -Screening labs today. -Healthy lifestyle discussed in detail. -She had a mammogram in November 2020. -She had a colonoscopy in 2019. -She saw her GYN, Dr. Phineas Real, earlier this year.  ANXIETY DEPRESSION -Stable, she does not feel depressed, she continues to take Celexa and as needed alprazolam.  Other irritable bowel syndrome -Stable, followed by GI  Obesity (BMI 30.0-34.9) -Discussed healthy lifestyle, including increased physical activity and better food choices to promote weight loss.   Patient Instructions  -Nice seeing you today!!  -Lab work today; will notify you once results are available.  -Remember your shingles vaccine at your pharmacy.  -Schedule follow up in 6 months.   Preventive Care 67 Years and Older, Female Preventive care refers to lifestyle choices and visits with your health care provider that can promote health and wellness. This includes:  A yearly physical exam. This is also called an annual well check.  Regular dental and eye exams.  Immunizations.  Screening for certain conditions.  Healthy lifestyle choices, such as diet and exercise. What can I expect for my preventive care visit? Physical exam Your health care provider will check:  Height and weight. These may be used to calculate body mass index (BMI), which is a measurement that tells if  you are at a healthy weight.  Heart rate and blood pressure.  Your skin for abnormal spots. Counseling Your health care provider may ask you questions about:  Alcohol, tobacco, and drug use.  Emotional well-being.  Home and relationship well-being.  Sexual activity.  Eating habits.  History of falls.  Memory and ability to understand (cognition).  Work and work Statistician.  Pregnancy and menstrual history. What immunizations do I need?  Influenza (flu) vaccine  This is recommended every year. Tetanus, diphtheria, and pertussis (Tdap) vaccine  You may  need a Td booster every 10 years. Varicella (chickenpox) vaccine  You may need this vaccine if you have not already been vaccinated. Zoster (shingles) vaccine  You may need this after age 30. Pneumococcal conjugate (PCV13) vaccine  One dose is recommended after age 21. Pneumococcal polysaccharide (PPSV23) vaccine  One dose is recommended after age 14. Measles, mumps, and rubella (MMR) vaccine  You may need at least one dose of MMR if you were born in 1957 or later. You may also need a second dose. Meningococcal conjugate (MenACWY) vaccine  You may need this if you have certain conditions. Hepatitis A vaccine  You may need this if you have certain conditions or if you travel or work in places where you may be exposed to hepatitis A. Hepatitis B vaccine  You may need this if you have certain conditions or if you travel or work in places where you may be exposed to hepatitis B. Haemophilus influenzae type b (Hib) vaccine  You may need this if you have certain conditions. You may receive vaccines as individual doses or as more than one vaccine together in one shot (combination vaccines). Talk with your health care provider about the risks and benefits of combination vaccines. What tests do I need? Blood tests  Lipid and cholesterol levels. These may be checked every 5 years, or more frequently depending on  your overall health.  Hepatitis C test.  Hepatitis B test. Screening  Lung cancer screening. You may have this screening every year starting at age 36 if you have a 30-pack-year history of smoking and currently smoke or have quit within the past 15 years.  Colorectal cancer screening. All adults should have this screening starting at age 73 and continuing until age 16. Your health care provider may recommend screening at age 65 if you are at increased risk. You will have tests every 1-10 years, depending on your results and the type of screening test.  Diabetes screening. This is done by checking your blood sugar (glucose) after you have not eaten for a while (fasting). You may have this done every 1-3 years.  Mammogram. This may be done every 1-2 years. Talk with your health care provider about how often you should have regular mammograms.  BRCA-related cancer screening. This may be done if you have a family history of breast, ovarian, tubal, or peritoneal cancers. Other tests  Sexually transmitted disease (STD) testing.  Bone density scan. This is done to screen for osteoporosis. You may have this done starting at age 20. Follow these instructions at home: Eating and drinking  Eat a diet that includes fresh fruits and vegetables, whole grains, lean protein, and low-fat dairy products. Limit your intake of foods with high amounts of sugar, saturated fats, and salt.  Take vitamin and mineral supplements as recommended by your health care provider.  Do not drink alcohol if your health care provider tells you not to drink.  If you drink alcohol: ? Limit how much you have to 0-1 drink a day. ? Be aware of how much alcohol is in your drink. In the [place] one drink equals one 12 oz bottle of beer (355 mL), one 5 oz glass of wine (148 mL), or one 1 oz glass of hard liquor (44 mL). Lifestyle  Take daily care of your teeth and gums.  Stay active. Exercise for at least 30 minutes on 5 or  more days each week.  Do not use any products that contain nicotine or tobacco, such  as cigarettes, e-cigarettes, and chewing tobacco. If you need help quitting, ask your health care provider.  If you are sexually active, practice safe sex. Use a condom or other form of protection in order to prevent STIs (sexually transmitted infections).  Talk with your health care provider about taking a low-dose aspirin or statin. What's next?  Go to your health care provider once a year for a well check visit.  Ask your health care provider how often you should have your eyes and teeth checked.  Stay up to date on all vaccines. This information is not intended to replace advice given to you by your health care provider. Make sure you discuss any questions you have with your health care provider. Document Revised: 01/27/2018 Document Reviewed: 01/27/2018 Elsevier Patient Education  2020 [place], [place] [facility]

## 2019-07-18 NOTE — Patient Instructions (Signed)
-Nice seeing you today!!  -Lab work today; will notify you once results are available.  -Remember your shingles vaccine at your pharmacy.  -Schedule follow up in 6 months.   Preventive Care 73 Years and Older, Female Preventive care refers to lifestyle choices and visits with your health care provider that can promote health and wellness. This includes:  A yearly physical exam. This is also called an annual well check.  Regular dental and eye exams.  Immunizations.  Screening for certain conditions.  Healthy lifestyle choices, such as diet and exercise. What can I expect for my preventive care visit? Physical exam Your health care provider will check:  Height and weight. These may be used to calculate body mass index (BMI), which is a measurement that tells if you are at a healthy weight.  Heart rate and blood pressure.  Your skin for abnormal spots. Counseling Your health care provider may ask you questions about:  Alcohol, tobacco, and drug use.  Emotional well-being.  Home and relationship well-being.  Sexual activity.  Eating habits.  History of falls.  Memory and ability to understand (cognition).  Work and work Statistician.  Pregnancy and menstrual history. What immunizations do I need?  Influenza (flu) vaccine  This is recommended every year. Tetanus, diphtheria, and pertussis (Tdap) vaccine  You may need a Td booster every 10 years. Varicella (chickenpox) vaccine  You may need this vaccine if you have not already been vaccinated. Zoster (shingles) vaccine  You may need this after age 73. Pneumococcal conjugate (PCV13) vaccine  One dose is recommended after age 73. Pneumococcal polysaccharide (PPSV23) vaccine  One dose is recommended after age 73. Measles, mumps, and rubella (MMR) vaccine  You may need at least one dose of MMR if you were born in 1957 or later. You may also need a second dose. Meningococcal conjugate (MenACWY)  vaccine  You may need this if you have certain conditions. Hepatitis A vaccine  You may need this if you have certain conditions or if you travel or work in places where you may be exposed to hepatitis A. Hepatitis B vaccine  You may need this if you have certain conditions or if you travel or work in places where you may be exposed to hepatitis B. Haemophilus influenzae type b (Hib) vaccine  You may need this if you have certain conditions. You may receive vaccines as individual doses or as more than one vaccine together in one shot (combination vaccines). Talk with your health care provider about the risks and benefits of combination vaccines. What tests do I need? Blood tests  Lipid and cholesterol levels. These may be checked every 5 years, or more frequently depending on your overall health.  Hepatitis C test.  Hepatitis B test. Screening  Lung cancer screening. You may have this screening every year starting at age 73 if you have a 30-pack-year history of smoking and currently smoke or have quit within the past 15 years.  Colorectal cancer screening. All adults should have this screening starting at age 73 and continuing until age 56. Your health care provider may recommend screening at age 72 if you are at increased risk. You will have tests every 1-10 years, depending on your results and the type of screening test.  Diabetes screening. This is done by checking your blood sugar (glucose) after you have not eaten for a while (fasting). You may have this done every 1-3 years.  Mammogram. This may be done every 1-2 years. Talk with your  health care provider about how often you should have regular mammograms.  BRCA-related cancer screening. This may be done if you have a family history of breast, ovarian, tubal, or peritoneal cancers. Other tests  Sexually transmitted disease (STD) testing.  Bone density scan. This is done to screen for osteoporosis. You may have this done  starting at age 73. Follow these instructions at home: Eating and drinking  Eat a diet that includes fresh fruits and vegetables, whole grains, lean protein, and low-fat dairy products. Limit your intake of foods with high amounts of sugar, saturated fats, and salt.  Take vitamin and mineral supplements as recommended by your health care provider.  Do not drink alcohol if your health care provider tells you not to drink.  If you drink alcohol: ? Limit how much you have to 0-1 drink a day. ? Be aware of how much alcohol is in your drink. In the [place] one drink equals one 12 oz bottle of beer (355 mL), one 5 oz glass of wine (148 mL), or one 1 oz glass of hard liquor (44 mL). Lifestyle  Take daily care of your teeth and gums.  Stay active. Exercise for at least 30 minutes on 5 or more days each week.  Do not use any products that contain nicotine or tobacco, such as cigarettes, e-cigarettes, and chewing tobacco. If you need help quitting, ask your health care provider.  If you are sexually active, practice safe sex. Use a condom or other form of protection in order to prevent STIs (sexually transmitted infections).  Talk with your health care provider about taking a low-dose aspirin or statin. What's next?  Go to your health care provider once a year for a well check visit.  Ask your health care provider how often you should have your eyes and teeth checked.  Stay up to date on all vaccines. This information is not intended to replace advice given to you by your health care provider. Make sure you discuss any questions you have with your health care provider. Document Revised: 01/27/2018 Document Reviewed: 01/27/2018 Elsevier Patient Education  2020 [employer].

## 2019-07-20 ENCOUNTER — Telehealth: Payer: Self-pay | Admitting: Internal Medicine

## 2019-07-20 DIAGNOSIS — K588 Other irritable bowel syndrome: Secondary | ICD-10-CM

## 2019-07-20 DIAGNOSIS — R7302 Impaired glucose tolerance (oral): Secondary | ICD-10-CM

## 2019-07-20 DIAGNOSIS — Z7689 Persons encountering health services in other specified circumstances: Secondary | ICD-10-CM

## 2019-07-20 NOTE — Telephone Encounter (Signed)
Okay to refer? 

## 2019-07-20 NOTE — Telephone Encounter (Signed)
Sure

## 2019-07-20 NOTE — Telephone Encounter (Signed)
Pt call and want a referral to a Nutritionist and want a call back.

## 2019-07-21 ENCOUNTER — Telehealth: Payer: Self-pay | Admitting: Internal Medicine

## 2019-07-21 NOTE — Telephone Encounter (Signed)
Pt want referral to [place] and diabetes classes.

## 2019-07-21 NOTE — Telephone Encounter (Signed)
Referral was placed.  See phone note.

## 2019-07-21 NOTE — Telephone Encounter (Signed)
Referral placed.

## 2019-07-26 ENCOUNTER — Other Ambulatory Visit: Payer: Self-pay

## 2019-07-26 ENCOUNTER — Telehealth (INDEPENDENT_AMBULATORY_CARE_PROVIDER_SITE_OTHER): Payer: Medicare Other | Admitting: Internal Medicine

## 2019-07-26 DIAGNOSIS — E785 Hyperlipidemia, unspecified: Secondary | ICD-10-CM | POA: Diagnosis not present

## 2019-07-26 DIAGNOSIS — R7302 Impaired glucose tolerance (oral): Secondary | ICD-10-CM

## 2019-07-26 NOTE — Progress Notes (Signed)
Virtual Visit via Telephone Note  I connected with Brianna Mack on 07/26/19 at  3:30 PM EDT by telephone and verified that I am speaking with the correct person using two identifiers.   I discussed the limitations, risks, security and privacy concerns of performing an evaluation and management service by telephone and the availability of in person appointments. I also discussed with the patient that there may be a patient responsible charge related to this service. The patient expressed understanding and agreed to proceed.  Location patient: home Location provider: work office Participants present for the call: patient, provider Patient did not have a visit in the prior 7 days to address this/these issue(s).   History of Present Illness:  She is scheduled this visit to discuss her lab results from June 1.  During her physical she was found to have hyperlipidemia with an LDL cholesterol of 151 she was also found to have impaired glucose tolerance with an A1c of 5.8.  She has no acute complaints today.   Observations/Objective: Patient sounds cheerful and well on the phone. I do not appreciate any increased work of breathing. Speech and thought processing are grossly intact. Patient reported vitals: None reported   Current Outpatient Medications:  .  acetaminophen (TYLENOL) 500 MG tablet, Take 1 tablet (500 mg total) by mouth every 6 (six) hours as needed for mild pain or moderate pain., Disp: 30 tablet, Rfl: 0 .  ALPRAZolam (XANAX) 0.25 MG tablet, Take 1 tablet (0.25 mg total) by mouth at bedtime as needed for anxiety., Disp: 30 tablet, Rfl: 4 .  ascorbic acid (VITAMIN C) 100 MG tablet, Take by mouth., Disp: , Rfl:  .  Ca Phosphate-Cholecalciferol (CALTRATE GUMMY BITES PO), Citracal-D3 Gummies, Disp: , Rfl:  .  Calcium Carbonate-Vitamin D (CALCIUM + D PO), Take 1 tablet by mouth daily. , Disp: , Rfl:  .  cetirizine (ZYRTEC) 10 MG tablet, Take by mouth., Disp: , Rfl:  .   citalopram (CELEXA) 20 MG tablet, Take 1 tablet (20 mg total) by mouth daily., Disp: 90 tablet, Rfl: 4 .  clobetasol cream (TEMOVATE) 0.05 %, , Disp: , Rfl:  .  hyoscyamine (LEVBID) 0.375 MG 12 hr tablet, Take 0.375 mg by mouth daily. , Disp: , Rfl:  .  valACYclovir (VALTREX) 500 MG tablet, Take 1 tablet (500 mg total) by mouth 2 (two) times daily., Disp: 30 tablet, Rfl: 3  Review of Systems:  Constitutional: Denies fever, chills, diaphoresis, appetite change and fatigue.  HEENT: Denies photophobia, eye pain, redness, hearing loss, ear pain, congestion, sore throat, rhinorrhea, sneezing, mouth sores, trouble swallowing, neck pain, neck stiffness and tinnitus.   Respiratory: Denies SOB, DOE, cough, chest tightness,  and wheezing.   Cardiovascular: Denies chest pain, palpitations and leg swelling.  Gastrointestinal: Denies nausea, vomiting, abdominal pain, diarrhea, constipation, blood in stool and abdominal distention.  Genitourinary: Denies dysuria, urgency, frequency, hematuria, flank pain and difficulty urinating.  Endocrine: Denies: hot or cold intolerance, sweats, changes in hair or nails, polyuria, polydipsia. Musculoskeletal: Denies myalgias, back pain, joint swelling, arthralgias and gait problem.  Skin: Denies pallor, rash and wound.  Neurological: Denies dizziness, seizures, syncope, weakness, light-headedness, numbness and headaches.  Hematological: Denies adenopathy. Easy bruising, personal or family bleeding history  Psychiatric/Behavioral: Denies suicidal ideation, mood changes, confusion, nervousness, sleep disturbance and agitation   Assessment and Plan:  Hyperlipidemia, unspecified hyperlipidemia type  IGT (impaired glucose tolerance)  -Lab results have been explained in detail to her. -She asked a lot  of great questions about diet modification and amount of exercise. -She has been counseled on incorporating 30 to 45 minutes at least 3 days a week of physical activity, in  addition we have discussed diet high in lean sources of protein and vegetables and limiting carbohydrate intake to 1 meal a day.    I discussed the assessment and treatment plan with the patient. The patient was provided an opportunity to ask questions and all were answered. The patient agreed with the plan and demonstrated an understanding of the instructions.   The patient was advised to call back or seek an in-person evaluation if the symptoms worsen or if the condition fails to improve as anticipated.  I provided 23 minutes of non-face-to-face time during this encounter.   Lelon Frohlich, MD [facility]

## 2019-08-17 ENCOUNTER — Encounter: Payer: Self-pay | Admitting: Skilled Nursing Facility1

## 2019-08-17 ENCOUNTER — Encounter: Payer: Medicare Other | Attending: Internal Medicine | Admitting: Skilled Nursing Facility1

## 2019-08-17 ENCOUNTER — Other Ambulatory Visit: Payer: Self-pay

## 2019-08-17 DIAGNOSIS — R7303 Prediabetes: Secondary | ICD-10-CM | POA: Diagnosis present

## 2019-08-17 NOTE — Progress Notes (Signed)
  Assessment:  Primary concerns today: prediabetes.   Pt states she has had trouble with her gums so she started taking vitamin C. Pt states she doe have IBS but knows what triggers it so she stays away from fried foods and spicy foods. Pt states after her husbands stroke and subsequent death she has not been taking as good of care of herself as she should. Pt states she has started not eating processed lunch meat.  A1C 5.8  Pt is interested in MDPP: Dietitian set pt up with an informational session and taught her how to use the virtual format on her phone.    MEDICATIONS: see list   DIETARY INTAKE:  Usual eating pattern includes 3 meals and 2 snacks per day.  Everyday foods include none stated.  Avoided foods include none stated.    24-hr recall:  B ( AM): oatmeal + blueberries or 2 eggs with cheese and veggies Snk ( AM):  L ( PM): [place] sandwich + chips Snk ( PM): chocolate  D ( PM): seafood or chicken + roasted vegetables + peas or salad + chicken Snk ( PM):  Beverages: water 64 ounces, diet soda, wine, coffee  Usual physical activity: walking 4 days 45 minutes to an hour   Intervention:  Nutrition cousneling. Dietitian educated pt on a healthy diet within the context of prediabetes inclusive of physical activity and plant based protein sources with a limitation of processed foods, chips, and desserts.   Teaching Method Utilized:  Visual Auditory Hands on  Handouts given during visit include:  Detailed MyPlate  Barriers to learning/adherence to lifestyle change: none identified   Demonstrated degree of understanding via:  Teach Back   Monitoring/Evaluation:  Dietary intake, exercise, and body weight prn.

## 2019-10-06 ENCOUNTER — Ambulatory Visit: Payer: Medicare Other | Admitting: Podiatry

## 2019-10-06 ENCOUNTER — Other Ambulatory Visit: Payer: Self-pay

## 2019-10-06 DIAGNOSIS — M79671 Pain in right foot: Secondary | ICD-10-CM

## 2019-10-06 DIAGNOSIS — B351 Tinea unguium: Secondary | ICD-10-CM | POA: Diagnosis not present

## 2019-10-06 DIAGNOSIS — M79674 Pain in right toe(s): Secondary | ICD-10-CM

## 2019-10-06 DIAGNOSIS — M79675 Pain in left toe(s): Secondary | ICD-10-CM | POA: Diagnosis not present

## 2019-10-06 DIAGNOSIS — M79672 Pain in left foot: Secondary | ICD-10-CM

## 2019-10-06 DIAGNOSIS — Q828 Other specified congenital malformations of skin: Secondary | ICD-10-CM

## 2019-10-07 ENCOUNTER — Encounter: Payer: Self-pay | Admitting: Podiatry

## 2019-10-07 NOTE — Progress Notes (Signed)
Subjective: Brianna Mack is a 74 y.o. female patient seen today painful porokeratotic lesion(s) b/l and painful mycotic toenails b/l that limit ambulation. Aggravating factors include weightbearing with and without shoe gear. Pain for both is relieved with periodic professional debridement.   She voices no new pedal concerns on today's visit.  Patient Active Problem List   Diagnosis Date Noted  . Hyperlipidemia 07/18/2019  . IGT (impaired glucose tolerance) 07/18/2019  . Pain in left knee 03/17/2018  . Irritable bowel syndrome 12/15/2016  . Fracture dislocation of shoulder joint 06/27/2014  . ANXIETY DEPRESSION 03/26/2009    Current Outpatient Medications on File Prior to Visit  Medication Sig Dispense Refill  . acetaminophen (TYLENOL) 500 MG tablet Take 1 tablet (500 mg total) by mouth every 6 (six) hours as needed for mild pain or moderate pain. 30 tablet 0  . ALPRAZolam (XANAX) 0.25 MG tablet Take 1 tablet (0.25 mg total) by mouth at bedtime as needed for anxiety. 30 tablet 4  . ascorbic acid (VITAMIN C) 100 MG tablet Take by mouth.    . Ca Phosphate-Cholecalciferol (CALTRATE GUMMY BITES PO) Citracal-D3 Gummies    . Calcium Carbonate-Vitamin D (CALCIUM + D PO) Take 1 tablet by mouth daily.     . cetirizine (ZYRTEC) 10 MG tablet Take by mouth.    . citalopram (CELEXA) 20 MG tablet Take 1 tablet (20 mg total) by mouth daily. 90 tablet 4  . clobetasol cream (TEMOVATE) 0.05 %     . hyoscyamine (LEVBID) 0.375 MG 12 hr tablet Take 0.375 mg by mouth daily.     . valACYclovir (VALTREX) 500 MG tablet Take 1 tablet (500 mg total) by mouth 2 (two) times daily. 30 tablet 3   No current facility-administered medications on file prior to visit.    Allergies  Allergen Reactions  . Augmentin [Amoxicillin-Pot Clavulanate] Itching and Swelling    Headache  . Cephalexin Diarrhea  . Codeine Sulfate     REACTION: nausea, vomiting  . Fluconazole Itching and Swelling    REACTION: swelling and  itching  . Morphine     REACTION: nausea, itching  . Oxycodone Nausea Only  . Sulfonamide Derivatives     REACTION: rash  . Amoxicillin Rash  . Cyclobenzaprine     Per patient makes her "very groggy"-unlike usual effect  . Other Swelling    Pt states shes also allergic to an antibiotic but does not know the name or have her list with her today, this antibiotic causes face swelling.    Objective: Physical Exam  General: Brianna Mack is a pleasant 74 y.o. Caucasian female, in NAD. AAO x 3.   Vascular:  Neurovascular status unchanged b/l. Capillary refill time to digits immediate b/l. Palpable DP pulses b/l. Palpable PT pulses b/l. Pedal hair sparse b/l. Skin temperature gradient within normal limits b/l. No edema noted b/l.  Dermatological:  Pedal skin with normal turgor, texture and tone bilaterally. No open wounds bilaterally. No interdigital macerations bilaterally. Toenails 1-5 b/l elongated, dystrophic, thickened, crumbly with subungual debris and tenderness to dorsal palpation. Distal 1/5 of all nail edges with residual mycotic subungual debris. Porokeratotic lesion(s) submet head 5 left foot and submet head 5 right foot. No erythema, no edema, no drainage, no flocculence.  Musculoskeletal:  Normal muscle strength 5/5 to all lower extremity muscle groups bilaterally. No gross bony deformities bilaterally. No pain crepitus or joint limitation noted with ROM b/l. Patient ambulates independent of any assistive aids.  Neurological:  Protective sensation  intact 5/5 intact bilaterally with 10g monofilament b/l. Vibratory sensation intact b/l. Proprioception intact bilaterally.  Assessment and Plan:  1. Pain due to onychomycosis of toenails of both feet   2. Porokeratosis   3. Pain in both feet    -Examined patient. -Toenails 1-5 b/l were debrided in length and girth with sterile nail nippers and dremel without iatrogenic bleeding.  -Painful porokeratotic lesion(s) submet head 5  left foot and submet head 5 right foot pared and enucleated with sterile scalpel blade without incident. -Patient to continue soft, supportive shoe gear daily. -Patient to report any pedal injuries to medical professional immediately. -Patient/POA to call should there be question/concern in the interim.  Return in about 3 months (around 01/06/2020).  Marzetta Board, DPM

## 2019-10-18 DIAGNOSIS — R1032 Left lower quadrant pain: Secondary | ICD-10-CM | POA: Diagnosis not present

## 2019-10-18 DIAGNOSIS — K219 Gastro-esophageal reflux disease without esophagitis: Secondary | ICD-10-CM | POA: Diagnosis not present

## 2019-10-18 DIAGNOSIS — K580 Irritable bowel syndrome with diarrhea: Secondary | ICD-10-CM | POA: Diagnosis not present

## 2019-11-14 DIAGNOSIS — L109 Pemphigus, unspecified: Secondary | ICD-10-CM | POA: Diagnosis not present

## 2019-12-06 DIAGNOSIS — R1032 Left lower quadrant pain: Secondary | ICD-10-CM | POA: Diagnosis not present

## 2019-12-12 ENCOUNTER — Other Ambulatory Visit: Payer: Self-pay

## 2019-12-12 MED ORDER — VALACYCLOVIR HCL 500 MG PO TABS
500.0000 mg | ORAL_TABLET | Freq: Two times a day (BID) | ORAL | 1 refills | Status: DC
Start: 2019-12-12 — End: 2021-05-13

## 2019-12-12 NOTE — Telephone Encounter (Signed)
Annual exam scheduled 01/19/20 with Dr. Delilah Shan. Refill sent. Dr. Dorette Grate 01/18/19 note read: 1. History of HSV diagnosed 2017.  Occasional outbreaks.  Uses Valtrex 500 mg twice daily for several days with outbreaks.  Has supply but will call when needs more.

## 2019-12-14 ENCOUNTER — Encounter (HOSPITAL_COMMUNITY): Payer: Self-pay

## 2019-12-14 ENCOUNTER — Emergency Department (HOSPITAL_COMMUNITY): Payer: Medicare Other

## 2019-12-14 ENCOUNTER — Emergency Department (HOSPITAL_COMMUNITY)
Admission: EM | Admit: 2019-12-14 | Discharge: 2019-12-15 | Disposition: A | Discharge status: Home / Self Care | Payer: Medicare Other | Attending: Emergency Medicine | Admitting: Emergency Medicine

## 2019-12-14 ENCOUNTER — Other Ambulatory Visit: Payer: Self-pay

## 2019-12-14 DIAGNOSIS — Y9389 Activity, other specified: Secondary | ICD-10-CM | POA: Diagnosis not present

## 2019-12-14 DIAGNOSIS — R410 Disorientation, unspecified: Secondary | ICD-10-CM | POA: Diagnosis not present

## 2019-12-14 DIAGNOSIS — Z87891 Personal history of nicotine dependence: Secondary | ICD-10-CM | POA: Diagnosis not present

## 2019-12-14 DIAGNOSIS — S0990XA Unspecified injury of head, initial encounter: Secondary | ICD-10-CM

## 2019-12-14 DIAGNOSIS — Y92009 Unspecified place in unspecified non-institutional (private) residence as the place of occurrence of the external cause: Secondary | ICD-10-CM | POA: Diagnosis not present

## 2019-12-14 DIAGNOSIS — S41111A Laceration without foreign body of right upper arm, initial encounter: Secondary | ICD-10-CM | POA: Insufficient documentation

## 2019-12-14 DIAGNOSIS — W19XXXA Unspecified fall, initial encounter: Secondary | ICD-10-CM | POA: Diagnosis not present

## 2019-12-14 DIAGNOSIS — I10 Essential (primary) hypertension: Secondary | ICD-10-CM | POA: Diagnosis not present

## 2019-12-14 DIAGNOSIS — Z20822 Contact with and (suspected) exposure to covid-19: Secondary | ICD-10-CM | POA: Insufficient documentation

## 2019-12-14 DIAGNOSIS — S51011A Laceration without foreign body of right elbow, initial encounter: Secondary | ICD-10-CM | POA: Diagnosis not present

## 2019-12-14 DIAGNOSIS — R404 Transient alteration of awareness: Secondary | ICD-10-CM | POA: Diagnosis not present

## 2019-12-14 DIAGNOSIS — R1110 Vomiting, unspecified: Secondary | ICD-10-CM | POA: Insufficient documentation

## 2019-12-14 DIAGNOSIS — Z743 Need for continuous supervision: Secondary | ICD-10-CM | POA: Diagnosis not present

## 2019-12-14 DIAGNOSIS — Z79899 Other long term (current) drug therapy: Secondary | ICD-10-CM | POA: Diagnosis not present

## 2019-12-14 DIAGNOSIS — R1111 Vomiting without nausea: Secondary | ICD-10-CM | POA: Diagnosis not present

## 2019-12-14 DIAGNOSIS — Z043 Encounter for examination and observation following other accident: Secondary | ICD-10-CM | POA: Diagnosis not present

## 2019-12-14 DIAGNOSIS — S0181XA Laceration without foreign body of other part of head, initial encounter: Secondary | ICD-10-CM | POA: Diagnosis not present

## 2019-12-14 DIAGNOSIS — S0083XA Contusion of other part of head, initial encounter: Secondary | ICD-10-CM | POA: Diagnosis not present

## 2019-12-14 DIAGNOSIS — R110 Nausea: Secondary | ICD-10-CM | POA: Diagnosis not present

## 2019-12-14 LAB — BASIC METABOLIC PANEL
Anion gap: 14 (ref 5–15)
BUN: 14 mg/dL (ref 8–23)
CO2: 23 mmol/L (ref 22–32)
Calcium: 9 mg/dL (ref 8.9–10.3)
Chloride: 101 mmol/L (ref 98–111)
Creatinine, Ser: 0.68 mg/dL (ref 0.44–1.00)
GFR, Estimated: >60 mL/min (ref >60)
Glucose, Bld: 109 mg/dL — ABNORMAL HIGH (ref 70–99)
Potassium: 3.6 mmol/L (ref 3.5–5.1)
Sodium: 138 mmol/L (ref 135–145)

## 2019-12-14 LAB — CBC
HCT: 44.4 % (ref 36.0–46.0)
Hemoglobin: 13.9 g/dL (ref 12.0–15.0)
MCH: 29.1 pg (ref 26.0–34.0)
MCHC: 31.3 g/dL (ref 30.0–36.0)
MCV: 92.9 fL (ref 80.0–100.0)
Platelets: 302 10*3/uL (ref 150–400)
RBC: 4.78 MIL/uL (ref 3.87–5.11)
RDW: 14.5 % (ref 11.5–15.5)
WBC: 12.2 10*3/uL — ABNORMAL HIGH (ref 4.0–10.5)
nRBC: 0 % (ref 0.0–0.2)

## 2019-12-14 LAB — ETHANOL: Alcohol, Ethyl (B): 161 mg/dL — ABNORMAL HIGH (ref <10)

## 2019-12-14 MED ORDER — LIDOCAINE-EPINEPHRINE 1 %-1:100000 IJ SOLN
20.0000 mL | Freq: Once | INTRAMUSCULAR | Status: AC
  Administered 2019-12-14: 20 mL
  Filled 2019-12-14: qty 1

## 2019-12-14 NOTE — ED Provider Notes (Signed)
[facility] EMERGENCY DEPARTMENT Provider Note   CSN: [id number] Arrival date & time: 12/14/19  2203     History Chief Complaint  Patient presents with  . Fall  . Head Injury    Brianna Mack is a 74 y.o. female.  Patient presents to the ED with a chief complaint of fall.  Per EMS, patient had unwitnessed fall at home.  Patient states that she had some wine after dinner and then next thing she recalls is waking up on the floor.  She states that she had a couple episodes of vomiting.  She was able to call her sister, who called EMS.  She is not anticoagulated.  She reports right elbow pain, but denies any other symptoms.  She states that she drink about 2 glasses of wine.  She has hx of IBS, but denies any other medical problems.  She denies having passed out like this before.  The history is provided by the patient. No language interpreter was used.       Past Medical History:  Diagnosis Date  . Anxiety   . Arthritis   . Depression   . HSV (herpes simplex virus) anogenital infection 11/2015  . IBS (irritable bowel syndrome)   . Stress fracture     Patient Active Problem List   Diagnosis Date Noted  . Hyperlipidemia 07/18/2019  . IGT (impaired glucose tolerance) 07/18/2019  . Pain in left knee 03/17/2018  . Irritable bowel syndrome 12/15/2016  . Fracture dislocation of shoulder joint 06/27/2014  . ANXIETY DEPRESSION 03/26/2009    Past Surgical History:  Procedure Laterality Date  . ABDOMINAL HYSTERECTOMY  2001   BSO ; SLING PROCEDURE endometriosis/leiomyomata  . CATARACT EXTRACTION, BILATERAL    . CHOLECYSTECTOMY  1976  . DILATION AND CURETTAGE OF UTERUS    . HAND SURGERY  04-02-2011   right  . OOPHORECTOMY     BSO  . ORIF SHOULDER FRACTURE Right 06/28/2014   Procedure: RIGHT SHOULDER HEMI-ARTHROPLASTY;  Surgeon: Netta Cedars, MD;  Location: [facility] ORS;  Service: Orthopedics;  Laterality: Right;  . TONSILLECTOMY  1954     OB History    Gravida   0   Para  0   Term  0   Preterm  0   AB  0   Living  0     SAB  0   TAB  0   Ectopic  0   Multiple  0   Live Births              Family History  Problem Relation Age of Onset  . Diabetes Mother   . Hypertension Mother   . Cancer Mother        Laurell Roof  . COPD Father     Social History   Tobacco Use  . Smoking status: Former Smoker    Quit date: 08/03/1977    Years since quitting: 42.3  . Smokeless tobacco: Never Used  Vaping Use  . Vaping Use: Never used  Substance Use Topics  . Alcohol use: Yes    Alcohol/week: 0.0 standard drinks    Comment: occasionally  . Drug use: No    Home Medications Prior to Admission medications   Medication Sig Start Date End Date Taking? Authorizing Provider  acetaminophen (TYLENOL) 500 MG tablet Take 1 tablet (500 mg total) by mouth every 6 (six) hours as needed for mild pain or moderate pain. 12/19/15   [name], [name]  ALPRAZolam Duanne Moron) 0.25 MG  tablet Take 1 tablet (0.25 mg total) by mouth at bedtime as needed for anxiety. 01/18/19   Fontaine, Belinda Block, MD  ascorbic acid (VITAMIN C) 100 MG tablet Take by mouth.    [provider]  Ca Phosphate-Cholecalciferol (CALTRATE GUMMY BITES PO) Citracal-D3 Gummies    [provider]  Calcium Carbonate-Vitamin D (CALCIUM + D PO) Take 1 tablet by mouth daily.     [provider]  cetirizine (ZYRTEC) 10 MG tablet Take by mouth.    [provider]  citalopram (CELEXA) 20 MG tablet Take 1 tablet (20 mg total) by mouth daily. 01/18/19   Fontaine, Belinda Block, MD  clobetasol cream (TEMOVATE) 0.05 %  09/08/18   [provider]  hyoscyamine (LEVBID) 0.375 MG 12 hr tablet Take 0.375 mg by mouth daily.     [provider]  valACYclovir (VALTREX) 500 MG tablet Take 1 tablet (500 mg total) by mouth 2 (two) times daily. 12/12/19   Joseph Pierini, MD    Allergies    Augmentin [amoxicillin-pot clavulanate], Cephalexin, Codeine  sulfate, Fluconazole, Morphine, Oxycodone, Sulfonamide derivatives, Amoxicillin, Cyclobenzaprine, and Other  Review of Systems   Review of Systems  All other systems reviewed and are negative.   Physical Exam Updated Vital Signs BP (!) 130/53 (BP Location: Right Arm)   Pulse 73   Temp 98 F (36.7 C) (Oral)   Resp 16   Ht 5' 2.5" (1.588 m)   Wt 78.9 kg   SpO2 100%   BMI 31.32 kg/m   Physical Exam Vitals and nursing note reviewed.  Constitutional:      General: She is not in acute distress.    Appearance: She is well-developed.  HENT:     Head: Normocephalic and atraumatic.     Comments: Contusion to right forehead Eyes:     Conjunctiva/sclera: Conjunctivae normal.  Cardiovascular:     Rate and Rhythm: Normal rate and regular rhythm.     Heart sounds: No murmur heard.   Pulmonary:     Effort: Pulmonary effort is normal. No respiratory distress.     Breath sounds: Normal breath sounds.  Abdominal:     Palpations: Abdomen is soft.     Tenderness: There is no abdominal tenderness.  Musculoskeletal:     Cervical back: Neck supple.     Comments: No bony deformity or abnormality in upper or lower extremities, ROM and strength is baseline, right shoulder ROM limited at baseline 2/2 partial shoulder replacement  Skin:    General: Skin is warm and dry.     Comments: 3 cm laceration to posterior elbow  Neurological:     Mental Status: She is alert and oriented to person, place, and time.  Psychiatric:        Mood and Affect: Mood normal.        Behavior: Behavior normal.     ED Results / Procedures / Treatments   Labs (all labs ordered are listed, but only abnormal results are displayed) Labs Reviewed  CBC  BASIC METABOLIC PANEL  ETHANOL    EKG None  Radiology No results found.  Procedures .Marland KitchenLaceration Repair  Date/Time: 12/15/2019 1:53 AM Performed by: Montine Circle, [name] Authorized by: Montine Circle, [name]   Consent:    Consent obtained:   Verbal   Consent given by:  Patient   Risks discussed:  Infection, need for additional repair, pain, poor cosmetic result and poor wound healing   Alternatives discussed:  No treatment and delayed treatment Universal protocol:  Procedure explained and questions answered to patient or proxy's satisfaction: yes     Relevant documents present and verified: yes     Test results available and properly labeled: yes     Imaging studies available: yes     Required blood products, implants, devices, and special equipment available: yes     Site/side marked: yes     Immediately prior to procedure, a time out was called: yes     Patient identity confirmed:  Verbally with patient Anesthesia (see MAR for exact dosages):    Anesthesia method:  Local infiltration   Local anesthetic:  Lidocaine 1% WITH epi Laceration details:    Location:  Shoulder/arm   Shoulder/arm location:  R elbow   Length (cm):  3 Repair type:    Repair type:  Simple Pre-procedure details:    Preparation:  Patient was prepped and draped in usual sterile fashion Exploration:    Hemostasis achieved with:  Epinephrine and direct pressure   Wound exploration: wound explored through full range of motion and entire depth of wound probed and visualized     Wound extent: no fascia violation noted, no foreign bodies/material noted, no muscle damage noted and no tendon damage noted     Contaminated: no   Treatment:    Area cleansed with:  Saline   Amount of cleaning:  Standard   Irrigation solution:  Sterile saline   Irrigation method:  Syringe   Visualized foreign bodies/material removed: no   Skin repair:    Repair method:  Sutures   Suture size:  4-0   Suture material:  Prolene   Suture technique:  Running locked   Number of sutures:  8 Approximation:    Approximation:  Close Post-procedure details:    Dressing:  Antibiotic ointment   (including critical care time)  Medications Ordered in ED Medications   lidocaine-EPINEPHrine (XYLOCAINE W/EPI) 2 %-1:200000 (PF) injection 20 mL (has no administration in time range)    ED Course  I have reviewed the triage vital signs and the nursing notes.  Pertinent labs & imaging results that were available during my care of the patient were reviewed by me and considered in my medical decision making (see chart for details).    MDM Rules/Calculators/A&P                          Patient presents with fall from home.  The fall was unwitnessed.  She hit the front of her head and also sustained a laceration to her right elbow.  Patient reports having 2 glasses of wine prior to the fall.  She states that she had not had anything to eat or drink for most of the day except for some scrambled eggs for breakfast.  She states that she was out and about running errands all day.  She states that she drinks a glass of wine quickly.  I suspect that the alcohol contributed to her fall.  CT head shows moderate right forehead and periorbital soft tissue hematoma with possible trace subarachnoid hemorrhage at the left frontal lobe.  No cervical spine fracture.   1:27 AM Case discussed with Costella, from neurosurgery, who advises sober and discharge.  Doesn't think patient needs admission or observation.   Laceration repaired.  Patient ambulates without difficulty.  She feels improved.  Blood pressures are stable.  She is in no acute distress.  She has no obvious neurologic deficit.  Believe that she is stable for  discharge and outpatient follow-up.  Final Clinical Impression(s) / ED Diagnoses Final diagnoses:  Fall, initial encounter  Injury of head, initial encounter  Laceration of right upper extremity, initial encounter    Rx / DC Orders ED Discharge Orders    None       Montine Circle, [name] 12/15/19 2216    Truddie Hidden, MD 12/15/19 2311

## 2019-12-14 NOTE — ED Triage Notes (Signed)
Patient arrives with [facility] s/p unwitnessed fall at home, patient lives alone, remembers eating dinner and having a few glasses of wine, reports LOC but not sure when she fell or how long she was on the floor, only remembers walking up on the floor, denies any blood thinners, bruising to R forehead and eye, EMS reports increased confusion on arrival to the hospital.   EMS vitals  144/78 117 HR 70 98% on RA 20 L A/C

## 2019-12-15 LAB — RESPIRATORY PANEL BY RT PCR (FLU A&B, COVID)
Influenza A by PCR: NEGATIVE
Influenza B by PCR: NEGATIVE
SARS Coronavirus 2 by RT PCR: NEGATIVE

## 2019-12-15 MED ORDER — BACITRACIN ZINC 500 UNIT/GM EX OINT
1.0000 "application " | TOPICAL_OINTMENT | Freq: Two times a day (BID) | CUTANEOUS | Status: DC
  Administered 2019-12-15: 1 via TOPICAL

## 2019-12-15 NOTE — ED Notes (Signed)
Patient ambulating with stand by assistance in hall.

## 2019-12-15 NOTE — Discharge Instructions (Signed)
Return to the ER for new or worsening symptoms.  See your doctor in 10-14 days for suture removal.

## 2019-12-27 ENCOUNTER — Ambulatory Visit (INDEPENDENT_AMBULATORY_CARE_PROVIDER_SITE_OTHER): Payer: Medicare Other | Admitting: Internal Medicine

## 2019-12-27 ENCOUNTER — Encounter: Payer: Self-pay | Admitting: Internal Medicine

## 2019-12-27 ENCOUNTER — Other Ambulatory Visit: Payer: Self-pay

## 2019-12-27 VITALS — BP 110/64 | HR 70 | Temp 98.2°F | Wt 175.7 lb

## 2019-12-27 DIAGNOSIS — L100 Pemphigus vulgaris: Secondary | ICD-10-CM | POA: Diagnosis not present

## 2019-12-27 DIAGNOSIS — Z23 Encounter for immunization: Secondary | ICD-10-CM | POA: Diagnosis not present

## 2019-12-27 DIAGNOSIS — Z4802 Encounter for removal of sutures: Secondary | ICD-10-CM

## 2019-12-27 DIAGNOSIS — R7302 Impaired glucose tolerance (oral): Secondary | ICD-10-CM | POA: Diagnosis not present

## 2019-12-27 DIAGNOSIS — W19XXXD Unspecified fall, subsequent encounter: Secondary | ICD-10-CM

## 2019-12-27 NOTE — Addendum Note (Signed)
Addended by: Westley Hummer B on: 12/27/2019 05:28 PM   Modules accepted: Orders

## 2019-12-27 NOTE — Progress Notes (Signed)
Established Patient Office Visit     This visit occurred during the SARS-CoV-2 public health emergency.  Safety protocols were in place, including screening questions prior to the visit, additional usage of staff PPE, and extensive cleaning of exam room while observing appropriate contact time as indicated for disinfecting solutions.    CC/Reason for Visit: Suture removal, discuss some acute and chronic concerns  HPI: Brianna Mack is a 74 y.o. female who is coming in today for the above mentioned reasons.  2 weeks ago she admits to having too much wine at home, she became unsteady on her feet and fell.  She suffered a left arm laceration that was sutured in the ED. she was asked to follow-up with me in 2 weeks for suture removal.  She has never had a syncopal event related to alcohol or acute alcohol intoxication.  She will sometimes have a glass of wine with dinner.  She denies being depressed.  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   Past Medical/Surgical History: Past Medical History:  Diagnosis Date  . Anxiety   . Arthritis   . Depression   . HSV (herpes simplex virus) anogenital infection 11/2015  . IBS (irritable bowel syndrome)   . Stress fracture     Past Surgical History:  Procedure Laterality Date  . ABDOMINAL HYSTERECTOMY  2001   BSO ; SLING PROCEDURE endometriosis/leiomyomata  . CATARACT EXTRACTION, BILATERAL    . CHOLECYSTECTOMY  1976  . DILATION AND CURETTAGE OF UTERUS    . HAND SURGERY  04-02-2011   right  . OOPHORECTOMY     BSO  . ORIF SHOULDER FRACTURE Right 06/28/2014   Procedure: RIGHT SHOULDER HEMI-ARTHROPLASTY;  Surgeon: Netta Cedars, MD;  Location: [facility];  Service: Orthopedics;  Laterality: Right;  . TONSILLECTOMY  1954    Social History:  reports that she quit smoking about 42 years ago. She has never used smokeless  tobacco. She reports current alcohol use. She reports that she does not use drugs.  Allergies: Allergies  Allergen Reactions  . Augmentin [Amoxicillin-Pot Clavulanate] Itching and Swelling    Headache  . Cephalexin Diarrhea  . Codeine Sulfate     REACTION: nausea, vomiting  . Fluconazole Itching and Swelling    REACTION: swelling and itching  . Morphine     REACTION: nausea, itching  . Oxycodone Nausea Only  . Sulfonamide Derivatives     REACTION: rash  . Amoxicillin Rash  . Cyclobenzaprine     Per patient makes her "very groggy"-unlike usual effect  . Other Swelling    Pt states shes also allergic to an antibiotic but does not know the name or have her list with her today, this antibiotic causes face swelling.    Family History:  Family History  Problem Relation Age of Onset  . Diabetes Mother   . Hypertension Mother   . Cancer Mother        Laurell Roof  . COPD Father      Current Outpatient Medications:  .  acetaminophen (TYLENOL) 500 MG tablet, Take 1 tablet (500 mg total) by mouth every 6 (six) hours as needed for mild pain or moderate pain., Disp: 30 tablet, Rfl: 0 .  ALPRAZolam (XANAX) 0.25 MG tablet, Take 1 tablet (0.25 mg total) by mouth at bedtime as needed for anxiety., Disp: 30 tablet, Rfl: 4 .  ascorbic acid (VITAMIN C) 100 MG tablet, Take by mouth., Disp: , Rfl:  .  Ca Phosphate-Cholecalciferol (CALTRATE GUMMY BITES PO), Citracal-D3 Gummies, Disp: , Rfl:  .  Calcium Carbonate-Vitamin D (CALCIUM + D PO), Take 1 tablet by mouth daily. , Disp: , Rfl:  .  cetirizine (ZYRTEC) 10 MG tablet, Take by mouth., Disp: , Rfl:  .  citalopram (CELEXA) 20 MG tablet, Take 1 tablet (20 mg total) by mouth daily., Disp: 90 tablet, Rfl: 4 .  clobetasol cream (TEMOVATE) 0.05 %, , Disp: , Rfl:  .  hyoscyamine (LEVBID) 0.375 MG 12 hr tablet, Take 0.375 mg by mouth daily. , Disp: , Rfl:  .  valACYclovir (VALTREX) 500 MG tablet, Take 1 tablet (500 mg total) by mouth 2 (two) times  daily., Disp: 30 tablet, Rfl: 1  Review of Systems:  Constitutional: Denies fever, chills, diaphoresis, appetite change and fatigue.  HEENT: Denies photophobia, eye pain, redness, hearing loss, ear pain, congestion, sore throat, rhinorrhea, sneezing, mouth sores, trouble swallowing, neck pain, neck stiffness and tinnitus.   Respiratory: Denies SOB, DOE, cough, chest tightness,  and wheezing.   Cardiovascular: Denies chest pain, palpitations and leg swelling.  Gastrointestinal: Denies nausea, vomiting, abdominal pain, diarrhea, constipation, blood in stool and abdominal distention.  Genitourinary: Denies dysuria, urgency, frequency, hematuria, flank pain and difficulty urinating.  Endocrine: Denies: hot or cold intolerance, sweats, changes in hair or nails, polyuria, polydipsia. Musculoskeletal: Denies myalgias, back pain, joint swelling, arthralgias and gait problem.  Skin: Denies pallor, rash and wound.  Neurological: Denies dizziness, seizures, syncope, weakness, light-headedness, numbness and headaches.  Hematological: Denies adenopathy. Easy bruising, personal or family bleeding history  Psychiatric/Behavioral: Denies suicidal ideation, mood changes, confusion, nervousness, sleep disturbance and agitation    Physical Exam: Vitals:   12/27/19 1532  BP: 110/64  Pulse: 70  Temp: 98.2 F (36.8 C)  TempSrc: Oral  SpO2: 97%  Weight: 175 lb 11.2 oz (79.7 kg)    Body mass index is 31.62 kg/m.  Constitutional: NAD, calm, comfortable Eyes: PERRL, lids and conjunctivae normal, wears corrective lenses ENMT: Mucous membranes are moist.  Skin: Well-healed laceration on dorsal aspect of the right forearm about 3 inches below the elbow. Neurologic: Grossly intact and nonfocal Psychiatric: Normal judgment and insight. Alert and oriented x 3. Normal mood.    Impression and Plan:  Visit for suture removal Fall, subsequent encounter -Sutures have been removed, no complications  noted.  Pemphigus vulgaris  -Advised that she touch base with her dermatologist.  IGT (impaired glucose tolerance) -A1c was 5.8, she has been working with a dietitian. -We have again reinforced importance of healthy eating habits and weight loss.  Need for influenza vaccination -Flu vaccine administered today.    Lelon Frohlich, MD [facility]

## 2020-01-09 ENCOUNTER — Ambulatory Visit: Payer: Medicare Other | Admitting: Podiatry

## 2020-01-16 ENCOUNTER — Ambulatory Visit: Payer: Medicare Other | Admitting: Internal Medicine

## 2020-01-19 ENCOUNTER — Ambulatory Visit (INDEPENDENT_AMBULATORY_CARE_PROVIDER_SITE_OTHER): Payer: Medicare Other | Admitting: Obstetrics and Gynecology

## 2020-01-19 ENCOUNTER — Encounter: Payer: Self-pay | Admitting: Obstetrics and Gynecology

## 2020-01-19 ENCOUNTER — Other Ambulatory Visit: Payer: Self-pay

## 2020-01-19 VITALS — BP 124/78 | Ht 62.0 in | Wt 175.0 lb

## 2020-01-19 DIAGNOSIS — Z01419 Encounter for gynecological examination (general) (routine) without abnormal findings: Secondary | ICD-10-CM

## 2020-01-19 DIAGNOSIS — F341 Dysthymic disorder: Secondary | ICD-10-CM

## 2020-01-19 MED ORDER — CITALOPRAM HYDROBROMIDE 20 MG PO TABS: 20.0000 mg | ORAL_TABLET | Freq: Every day | ORAL | 4 refills | Status: DC

## 2020-01-19 MED ORDER — ALPRAZOLAM 0.25 MG PO TABS
0.2500 mg | ORAL_TABLET | Freq: Every evening | ORAL | 3 refills | Status: DC | PRN
Start: 2020-01-19 — End: 2021-05-22

## 2020-01-19 NOTE — Progress Notes (Signed)
Brianna Mack [facility] 22-Apr-1945 [id number]  SUBJECTIVE:  74 y.o. G0P0000 female here for a breast and pelvic exam. She has no gynecologic concerns.  74 y.o. G0P0000 female here for a breast and pelvic exam. She has no gynecologic concerns.  74 y.o. G0P0000 female here for a breast and pelvic exam. She has no gynecologic concerns.  Current Outpatient Medications  Medication Sig Dispense Refill  . acetaminophen (TYLENOL) 500 MG tablet Take 1 tablet (500 mg total) by mouth every 6 (six) hours as needed for mild pain or moderate pain. 30 tablet 0  . ALPRAZolam (XANAX) 0.25 MG tablet Take 1 tablet (0.25 mg total) by mouth at bedtime as needed for anxiety. 30 tablet 4  . ascorbic acid (VITAMIN C) 100 MG tablet Take by mouth.    . Ca Phosphate-Cholecalciferol (CALTRATE GUMMY BITES PO) Citracal-D3 Gummies    . Calcium Carbonate-Vitamin D (CALCIUM + D PO) Take 1 tablet by mouth daily.     . cetirizine (ZYRTEC) 10 MG tablet Take by mouth.    . citalopram (CELEXA) 20 MG tablet Take 1 tablet (20 mg total) by mouth daily. 90 tablet 4  . clobetasol cream (TEMOVATE) 0.05 %     . hyoscyamine (LEVBID) 0.375 MG 12 hr tablet Take 0.375 mg by mouth daily.     . valACYclovir (VALTREX) 500 MG tablet Take 1 tablet (500 mg total) by mouth 2 (two) times daily. 30 tablet 1   No current facility-administered medications for this visit.   Allergies: Augmentin [amoxicillin-pot clavulanate], Cephalexin, Codeine sulfate, Fluconazole, Morphine, Oxycodone, Sulfonamide derivatives, Amoxicillin, Cyclobenzaprine, and Other  No LMP recorded. Patient has had a hysterectomy.  Past medical history,surgical history, problem list, medications, allergies, family history and social history were all reviewed and documented as reviewed in the EPIC chart.  GYN ROS: no abnormal bleeding, pelvic pain or discharge, no breast pain or new or enlarging lumps on self exam.  No dysuria, frequency, burning, pain with urination, cloudy/malodorous urine.   OBJECTIVE:  BP 124/78 (BP Location: Right Arm, Patient Position: Sitting, Cuff Size: Normal)   Ht 5\' 2"  (1.575 m)   Wt 175 lb (79.4 kg)   BMI 32.01 kg/m  The patient  appears well, alert, oriented, in no distress.  BREAST EXAM: breasts appear normal, no suspicious masses, no skin or nipple changes or axillary nodes  PELVIC EXAM: VULVA: normal appearing vulva with atrophic change, no masses, tenderness or lesions, VAGINA: normal appearing vagina with trophic change, normal color and discharge, no lesions, CERVIX: surgically absent, UTERUS: surgically absent, vaginal cuff normal, ADNEXA: no masses, nontender Chaperone: Wandra Scot Bonham present during the examination  ASSESSMENT:  74 y.o. G0P0000 here for a breast and pelvic exam  PLAN:   1. Postmenopausal. Prior TAH/BSO for endometriosis and leiomyoma in 2001.  She has weaned off of HRT.  No significant menopausal symptoms. 2. History of HSV, diagnosed 2017.  Uses Valtrex 500 mg twice daily for several days if an outbreak.  Recently refilled, she will notify us if she needs any additional refills. 3.  Anxiety and depression.  Uses Celexa 20 mg daily.  Also has been taking Xanax 0.25 mg as needed.  She indicates she has only needed to use it sporadically.  She does not use it more than once or twice a week at the most.  I expressed to her my concern for taking benzodiazepines for the sedating properties which could potentially be harmful or fatal if on occasion is taken too frequently or into high of a dose, particularly with increasing age and she understands the addictive properties as well.  Taking this into account she understands this and I will give her a prescription for Xanax 0.25  mg tablets, #30, 3 refills.  She has had a lot of loss in her life in the last several years and ill family members.  I encouraged her to explore with her primary doctor regarding alternative ways to address episodic anxiety. 4. Pap smear 10/2014.  No significant history of abnormal Pap smears.  She has been comfortable not continuing screening based on the current guidelines.   5. Mammogram 12/2018.  Normal breast exam today.  She  is reminded to schedule an annual mammogram this year she realizes she is overdue. 6. Colonoscopy 2019.  She will follow up at the interval recommended by her GI specialist. 7. DEXA 12/2016 normal.  Next DEXA recommended 2023. 8. Health maintenance.  No labs today as she normally has these completed elsewhere.  Return annually or sooner, prn.  Joseph Pierini MD 01/19/20

## 2020-01-23 ENCOUNTER — Ambulatory Visit: Payer: Self-pay | Admitting: Internal Medicine

## 2020-01-24 ENCOUNTER — Other Ambulatory Visit: Payer: Self-pay

## 2020-01-24 ENCOUNTER — Encounter: Payer: Self-pay | Admitting: Podiatry

## 2020-01-24 ENCOUNTER — Ambulatory Visit: Payer: Medicare Other | Admitting: Podiatry

## 2020-01-24 DIAGNOSIS — M79675 Pain in left toe(s): Secondary | ICD-10-CM | POA: Diagnosis not present

## 2020-01-24 DIAGNOSIS — Q828 Other specified congenital malformations of skin: Secondary | ICD-10-CM | POA: Diagnosis not present

## 2020-01-24 DIAGNOSIS — M79671 Pain in right foot: Secondary | ICD-10-CM | POA: Diagnosis not present

## 2020-01-24 DIAGNOSIS — M79672 Pain in left foot: Secondary | ICD-10-CM

## 2020-01-24 DIAGNOSIS — M79674 Pain in right toe(s): Secondary | ICD-10-CM | POA: Diagnosis not present

## 2020-01-24 DIAGNOSIS — B351 Tinea unguium: Secondary | ICD-10-CM | POA: Diagnosis not present

## 2020-01-25 ENCOUNTER — Other Ambulatory Visit: Payer: Self-pay

## 2020-01-25 ENCOUNTER — Ambulatory Visit (INDEPENDENT_AMBULATORY_CARE_PROVIDER_SITE_OTHER): Payer: Medicare Other | Admitting: Internal Medicine

## 2020-01-25 VITALS — BP 120/72 | HR 74 | Temp 98.2°F | Ht 62.0 in | Wt 173.4 lb

## 2020-01-25 DIAGNOSIS — E785 Hyperlipidemia, unspecified: Secondary | ICD-10-CM | POA: Diagnosis not present

## 2020-01-25 DIAGNOSIS — R7302 Impaired glucose tolerance (oral): Secondary | ICD-10-CM

## 2020-01-25 DIAGNOSIS — R7303 Prediabetes: Secondary | ICD-10-CM | POA: Diagnosis not present

## 2020-01-25 LAB — POCT GLYCOSYLATED HEMOGLOBIN (HGB A1C): Hemoglobin A1C: 5.3 % (ref 4.0–5.6)

## 2020-01-25 NOTE — Progress Notes (Signed)
Established Patient Office Visit     This visit occurred during the SARS-CoV-2 public health emergency.  Safety protocols were in place, including screening questions prior to the visit, additional usage of staff PPE, and extensive cleaning of exam room while observing appropriate contact time as indicated for disinfecting solutions.    CC/Reason for Visit: 17-month follow-up chronic medical conditions  HPI: Brianna Mack is a 74 y.o. female who is coming in today for the above mentioned reasons. Past Medical History is significant for: Impaired glucose tolerance and hyperlipidemia not yet on medications.  At her last physical 6 months ago she was found to have an LDL of 151 and an A1c of 5.8.  She has started seeing a nutritionist.  Is working on lifestyle modifications.  She has lost around 5 pounds since her last visit.  She has no acute complaints today.   Past Medical/Surgical History: Past Medical History:  Diagnosis Date  . Anxiety   . Arthritis   . Depression   . HSV (herpes simplex virus) anogenital infection 11/2015  . IBS (irritable bowel syndrome)   . Stress fracture     Past Surgical History:  Procedure Laterality Date  . ABDOMINAL HYSTERECTOMY  2001   BSO ; SLING PROCEDURE endometriosis/leiomyomata  . CATARACT EXTRACTION, BILATERAL    . CHOLECYSTECTOMY  1976  . DILATION AND CURETTAGE OF UTERUS    . HAND SURGERY  04-02-2011   right  . OOPHORECTOMY     BSO  . ORIF SHOULDER FRACTURE Right 06/28/2014   Procedure: RIGHT SHOULDER HEMI-ARTHROPLASTY;  Surgeon: Netta Cedars, MD;  Location: [facility];  Service: Orthopedics;  Laterality: Right;  . TONSILLECTOMY  1954    Social History:  reports that she quit smoking about 42 years ago. She has never used smokeless tobacco. She reports current alcohol use. She reports that she does not use drugs.  Allergies: Allergies  Allergen Reactions  . Augmentin [Amoxicillin-Pot Clavulanate] Itching and Swelling    Headache   . Cephalexin Diarrhea  . Codeine Sulfate     REACTION: nausea, vomiting  . Fluconazole Itching and Swelling    REACTION: swelling and itching  . Morphine     REACTION: nausea, itching  . Oxycodone Nausea Only  . Sulfonamide Derivatives     REACTION: rash  . Amoxicillin Rash  . Cyclobenzaprine     Per patient makes her "very groggy"-unlike usual effect  . Other Swelling    Pt states shes also allergic to an antibiotic but does not know the name or have her list with her today, this antibiotic causes face swelling.    Family History:  Family History  Problem Relation Age of Onset  . Diabetes Mother   . Hypertension Mother   . Cancer Mother        Laurell Roof  . COPD Father      Current Outpatient Medications:  .  acetaminophen (TYLENOL) 500 MG tablet, Take 1 tablet (500 mg total) by mouth every 6 (six) hours as needed for mild pain or moderate pain., Disp: 30 tablet, Rfl: 0 .  ALPRAZolam (XANAX) 0.25 MG tablet, Take 1 tablet (0.25 mg total) by mouth at bedtime as needed for anxiety., Disp: 30 tablet, Rfl: 3 .  ascorbic acid (VITAMIN C) 100 MG tablet, Take by mouth., Disp: , Rfl:  .  Ca Phosphate-Cholecalciferol (CALTRATE GUMMY BITES PO), Citracal-D3 Gummies, Disp: , Rfl:  .  Calcium Carbonate-Vitamin D (CALCIUM + D PO), Take 1 tablet by mouth  daily., Disp: , Rfl:  .  cetirizine (ZYRTEC) 10 MG tablet, Take by mouth., Disp: , Rfl:  .  citalopram (CELEXA) 20 MG tablet, Take 1 tablet (20 mg total) by mouth daily., Disp: 90 tablet, Rfl: 4 .  clindamycin (CLEOCIN) 150 MG capsule, Take 150 mg by mouth 4 (four) times daily., Disp: , Rfl:  .  clobetasol cream (TEMOVATE) 0.05 %, , Disp: , Rfl:  .  cyanocobalamin 1000 MCG tablet, Take by mouth., Disp: , Rfl:  .  fluocinonide gel (LIDEX) 0.05 %, Apply topically., Disp: , Rfl:  .  hyoscyamine (LEVBID) 0.375 MG 12 hr tablet, Take 0.375 mg by mouth daily., Disp: , Rfl:  .  L-Lysine 500 MG TABS, Take by mouth., Disp: , Rfl:  .   valACYclovir (VALTREX) 500 MG tablet, Take 1 tablet (500 mg total) by mouth 2 (two) times daily., Disp: 30 tablet, Rfl: 1  Review of Systems:  Constitutional: Denies fever, chills, diaphoresis, appetite change and fatigue.  HEENT: Denies photophobia, eye pain, redness, hearing loss, ear pain, congestion, sore throat, rhinorrhea, sneezing, mouth sores, trouble swallowing, neck pain, neck stiffness and tinnitus.   Respiratory: Denies SOB, DOE, cough, chest tightness,  and wheezing.   Cardiovascular: Denies chest pain, palpitations and leg swelling.  Gastrointestinal: Denies nausea, vomiting, abdominal pain, diarrhea, constipation, blood in stool and abdominal distention.  Genitourinary: Denies dysuria, urgency, frequency, hematuria, flank pain and difficulty urinating.  Endocrine: Denies: hot or cold intolerance, sweats, changes in hair or nails, polyuria, polydipsia. Musculoskeletal: Denies myalgias, back pain, joint swelling, arthralgias and gait problem.  Skin: Denies pallor, rash and wound.  Neurological: Denies dizziness, seizures, syncope, weakness, light-headedness, numbness and headaches.  Hematological: Denies adenopathy. Easy bruising, personal or family bleeding history  Psychiatric/Behavioral: Denies suicidal ideation, mood changes, confusion, nervousness, sleep disturbance and agitation    Physical Exam: Vitals:   01/25/20 1129  BP: 120/72  Pulse: 74  Temp: 98.2 F (36.8 C)  TempSrc: Oral  SpO2: (!) 74%  Weight: 173 lb 6.4 oz (78.7 kg)  Height: 5\' 2"  (1.575 m)    Body mass index is 31.72 kg/m.   Constitutional: NAD, calm, comfortable Eyes: PERRL, lids and conjunctivae normal, wears corrective lenses ENMT: Mucous membranes are moist.  Respiratory: clear to auscultation bilaterally, no wheezing, no crackles. Normal respiratory effort. No accessory muscle use.  Cardiovascular: Regular rate and rhythm, no murmurs / rubs / gallops. No extremity edema. Neurologic: Grossly  intact and nonfocal. Psychiatric: Normal judgment and insight. Alert and oriented x 3. Normal mood.    Impression and Plan:  Hyperlipidemia, unspecified hyperlipidemia type  - Plan: Lipid panel -Last LDL was 151 in June 2021, she is not on medications.  IGT (impaired glucose tolerance) -A1c today is 5.3, will continue to monitor.  She has been congratulated on her weight loss efforts thus far.   Patient Instructions  -Nice seeing you today!!  -Lab work today; will notify you once results are available.  -Schedule follow up in 6 months for your physical. Please come in fasting that day.     Lelon Frohlich, MD [facility]

## 2020-01-25 NOTE — Patient Instructions (Signed)
-  Nice seeing you today!!  -Lab work today; will notify you once results are available.  -Schedule follow up in 6 months for your physical. Please come in fasting that day. 

## 2020-01-26 ENCOUNTER — Other Ambulatory Visit: Payer: Self-pay | Admitting: Internal Medicine

## 2020-01-26 DIAGNOSIS — E785 Hyperlipidemia, unspecified: Secondary | ICD-10-CM

## 2020-01-26 LAB — LIPID PANEL
Cholesterol: 250 mg/dL — ABNORMAL HIGH (ref <200)
HDL: 84 mg/dL (ref >=50)
LDL Cholesterol (Calc): 148 mg/dL (calc) — ABNORMAL HIGH
Non-HDL Cholesterol (Calc): 166 mg/dL (calc) — ABNORMAL HIGH (ref <130)
Total CHOL/HDL Ratio: 3 (calc) (ref <5.0)
Triglycerides: 79 mg/dL (ref <150)

## 2020-01-26 MED ORDER — ATORVASTATIN CALCIUM 10 MG PO TABS: 10.0000 mg | ORAL_TABLET | Freq: Every day | ORAL | 1 refills | Status: DC

## 2020-01-28 NOTE — Progress Notes (Signed)
Subjective: Brianna Mack is a 74 y.o. female patient seen today painful porokeratotic lesion(s) b/l and painful mycotic toenails b/l that limit ambulation. Aggravating factors include weightbearing with and without shoe gear. Pain for both is relieved with periodic professional debridement.   She voices no new pedal concerns on today's visit.  Patient Active Problem List   Diagnosis Date Noted  . Hyperlipidemia 07/18/2019  . IGT (impaired glucose tolerance) 07/18/2019  . GERD (gastroesophageal reflux disease) 05/04/2019  . Pain in left knee 03/17/2018  . Irritable bowel syndrome 12/15/2016  . Fracture dislocation of shoulder joint 06/27/2014  . ANXIETY DEPRESSION 03/26/2009    Current Outpatient Medications on File Prior to Visit  Medication Sig Dispense Refill  . acetaminophen (TYLENOL) 500 MG tablet Take 1 tablet (500 mg total) by mouth every 6 (six) hours as needed for mild pain or moderate pain. 30 tablet 0  . ALPRAZolam (XANAX) 0.25 MG tablet Take 1 tablet (0.25 mg total) by mouth at bedtime as needed for anxiety. 30 tablet 3  . ascorbic acid (VITAMIN C) 100 MG tablet Take by mouth.    . Ca Phosphate-Cholecalciferol (CALTRATE GUMMY BITES PO) Citracal-D3 Gummies    . Calcium Carbonate-Vitamin D (CALCIUM + D PO) Take 1 tablet by mouth daily.    . cetirizine (ZYRTEC) 10 MG tablet Take by mouth.    . citalopram (CELEXA) 20 MG tablet Take 1 tablet (20 mg total) by mouth daily. 90 tablet 4  . clindamycin (CLEOCIN) 150 MG capsule Take 150 mg by mouth 4 (four) times daily.    . clobetasol cream (TEMOVATE) 0.05 %     . cyanocobalamin 1000 MCG tablet Take by mouth.    . fluocinonide gel (LIDEX) 0.05 % Apply topically.    . hyoscyamine (LEVBID) 0.375 MG 12 hr tablet Take 0.375 mg by mouth daily.    Marland Kitchen L-Lysine 500 MG TABS Take by mouth.    . valACYclovir (VALTREX) 500 MG tablet Take 1 tablet (500 mg total) by mouth 2 (two) times daily. 30 tablet 1   No current facility-administered  medications on file prior to visit.    Allergies  Allergen Reactions  . Augmentin [Amoxicillin-Pot Clavulanate] Itching and Swelling    Headache  . Cephalexin Diarrhea  . Codeine Sulfate     REACTION: nausea, vomiting  . Fluconazole Itching and Swelling    REACTION: swelling and itching  . Morphine     REACTION: nausea, itching  . Oxycodone Nausea Only  . Sulfonamide Derivatives     REACTION: rash  . Amoxicillin Rash  . Cyclobenzaprine     Per patient makes her "very groggy"-unlike usual effect  . Other Swelling    Pt states shes also allergic to an antibiotic but does not know the name or have her list with her today, this antibiotic causes face swelling.    Objective: Physical Exam  General: Brianna Mack is a pleasant 74 y.o. Caucasian female, in NAD. AAO x 3.   Vascular:  Neurovascular status unchanged b/l. Capillary refill time to digits immediate b/l. Palpable DP pulses b/l. Palpable PT pulses b/l. Pedal hair sparse b/l. Skin temperature gradient within normal limits b/l. No edema noted b/l.  Dermatological:  Pedal skin with normal turgor, texture and tone bilaterally. No open wounds bilaterally. No interdigital macerations bilaterally. Toenails 1-5 b/l elongated, dystrophic, thickened, crumbly with subungual debris and tenderness to dorsal palpation. Distal 1/5 of all nail edges with residual mycotic subungual debris. Porokeratotic lesion(s) submet head 5 left  foot and submet head 5 right foot. No erythema, no edema, no drainage, no flocculence.  Musculoskeletal:  Normal muscle strength 5/5 to all lower extremity muscle groups bilaterally. No gross bony deformities bilaterally. No pain crepitus or joint limitation noted with ROM b/l. Patient ambulates independent of any assistive aids.  Neurological:  Protective sensation intact 5/5 intact bilaterally with 10g monofilament b/l. Vibratory sensation intact b/l. Proprioception intact bilaterally.  Assessment and Plan:   1. Pain due to onychomycosis of toenails of both feet   2. Porokeratosis   3. Pain in both feet    -Examined patient. -Continue soft, supportive shoe gear daily.  -Toenails 1-5 b/l were debrided in length and girth with sterile nail nippers and dremel without iatrogenic bleeding.  -Painful porokeratotic lesion(s) submet head 5 left foot and submet head 5 right foot pared and enucleated with sterile scalpel blade without incident. -Patient to continue soft, supportive shoe gear daily. -Patient to report any pedal injuries to medical professional immediately. -Patient/POA to call should there be question/concern in the interim.  Return in about 3 months (around 04/23/2020) for toenail debridement w/corn(s)/callus(es).  Marzetta Board, DPM

## 2020-02-13 DIAGNOSIS — J069 Acute upper respiratory infection, unspecified: Secondary | ICD-10-CM | POA: Diagnosis not present

## 2020-02-13 DIAGNOSIS — R059 Cough, unspecified: Secondary | ICD-10-CM | POA: Diagnosis not present

## 2020-04-24 ENCOUNTER — Ambulatory Visit: Payer: Medicare Other | Admitting: Podiatry

## 2020-06-05 ENCOUNTER — Ambulatory Visit (INDEPENDENT_AMBULATORY_CARE_PROVIDER_SITE_OTHER): Payer: Medicare Other | Admitting: Internal Medicine

## 2020-06-05 ENCOUNTER — Other Ambulatory Visit: Payer: Self-pay

## 2020-06-05 ENCOUNTER — Encounter: Payer: Self-pay | Admitting: Internal Medicine

## 2020-06-05 VITALS — BP 120/74 | HR 62 | Temp 98.1°F | Wt 182.2 lb

## 2020-06-05 DIAGNOSIS — R7302 Impaired glucose tolerance (oral): Secondary | ICD-10-CM | POA: Diagnosis not present

## 2020-06-05 DIAGNOSIS — E785 Hyperlipidemia, unspecified: Secondary | ICD-10-CM | POA: Diagnosis not present

## 2020-06-05 LAB — LIPID PANEL
Cholesterol: 160 mg/dL (ref 0–200)
HDL: 68.3 mg/dL (ref >39.00)
LDL Cholesterol: 77 mg/dL (ref 0–99)
NonHDL: 91.87
Total CHOL/HDL Ratio: 2
Triglycerides: 72 mg/dL (ref 0.0–149.0)
VLDL: 14.4 mg/dL (ref 0.0–40.0)

## 2020-06-05 LAB — POCT GLYCOSYLATED HEMOGLOBIN (HGB A1C): Hemoglobin A1C: 5.5 % (ref 4.0–5.6)

## 2020-06-05 NOTE — Progress Notes (Signed)
Established Patient Office Visit     This visit occurred during the SARS-CoV-2 public health emergency.  Safety protocols were in place, including screening questions prior to the visit, additional usage of staff PPE, and extensive cleaning of exam room while observing appropriate contact time as indicated for disinfecting solutions.    CC/Reason for Visit: Follow-up chronic conditions  HPI: Brianna Mack is a 75 y.o. female who is coming in today for the above mentioned reasons. Past Medical History is significant for: Hyperlipidemia and impaired glucose tolerance.  She was started on 10 mg of atorvastatin back in December, 4 months ago.  She is interested in rechecking her lipids today.  She is concerned that her A1c has increased that she has gained about 10 pounds.  Otherwise she is feeling well and has no acute issues.   Past Medical/Surgical History: Past Medical History:  Diagnosis Date  . Anxiety   . Arthritis   . Depression   . HSV (herpes simplex virus) anogenital infection 11/2015  . IBS (irritable bowel syndrome)   . Stress fracture     Past Surgical History:  Procedure Laterality Date  . ABDOMINAL HYSTERECTOMY  2001   BSO ; SLING PROCEDURE endometriosis/leiomyomata  . CATARACT EXTRACTION, BILATERAL    . CHOLECYSTECTOMY  1976  . DILATION AND CURETTAGE OF UTERUS    . HAND SURGERY  04-02-2011   right  . OOPHORECTOMY     BSO  . ORIF SHOULDER FRACTURE Right 06/28/2014   Procedure: RIGHT SHOULDER HEMI-ARTHROPLASTY;  Surgeon: Netta Cedars, MD;  Location: [facility];  Service: Orthopedics;  Laterality: Right;  . TONSILLECTOMY  1954    Social History:  reports that she quit smoking about 42 years ago. She has never used smokeless tobacco. She reports current alcohol use. She reports that she does not use drugs.  Allergies: Allergies  Allergen Reactions  . Augmentin [Amoxicillin-Pot Clavulanate] Itching and Swelling    Headache  . Cephalexin Diarrhea  . Codeine  Sulfate     REACTION: nausea, vomiting  . Fluconazole Itching and Swelling    REACTION: swelling and itching  . Morphine     REACTION: nausea, itching  . Oxycodone Nausea Only  . Sulfonamide Derivatives     REACTION: rash  . Amoxicillin Rash  . Cyclobenzaprine     Per patient makes her "very groggy"-unlike usual effect  . Other Swelling    Pt states shes also allergic to an antibiotic but does not know the name or have her list with her today, this antibiotic causes face swelling.    Family History:  Family History  Problem Relation Age of Onset  . Diabetes Mother   . Hypertension Mother   . Cancer Mother        Laurell Roof  . COPD Father      Current Outpatient Medications:  .  acetaminophen (TYLENOL) 500 MG tablet, Take 1 tablet (500 mg total) by mouth every 6 (six) hours as needed for mild pain or moderate pain., Disp: 30 tablet, Rfl: 0 .  ALPRAZolam (XANAX) 0.25 MG tablet, Take 1 tablet (0.25 mg total) by mouth at bedtime as needed for anxiety., Disp: 30 tablet, Rfl: 3 .  ascorbic acid (VITAMIN C) 100 MG tablet, Take by mouth., Disp: , Rfl:  .  atorvastatin (LIPITOR) 10 MG tablet, Take 1 tablet (10 mg total) by mouth daily., Disp: 90 tablet, Rfl: 1 .  Ca Phosphate-Cholecalciferol (CALTRATE GUMMY BITES PO), Citracal-D3 Gummies, Disp: , Rfl:  .  Calcium Carbonate-Vitamin D (CALCIUM + D PO), Take 1 tablet by mouth daily., Disp: , Rfl:  .  cetirizine (ZYRTEC) 10 MG tablet, Take by mouth., Disp: , Rfl:  .  citalopram (CELEXA) 20 MG tablet, Take 1 tablet (20 mg total) by mouth daily., Disp: 90 tablet, Rfl: 4 .  clindamycin (CLEOCIN) 150 MG capsule, Take 150 mg by mouth 4 (four) times daily., Disp: , Rfl:  .  clobetasol cream (TEMOVATE) 0.05 %, , Disp: , Rfl:  .  cyanocobalamin 1000 MCG tablet, Take by mouth., Disp: , Rfl:  .  fluocinonide gel (LIDEX) 0.05 %, Apply topically., Disp: , Rfl:  .  hyoscyamine (LEVBID) 0.375 MG 12 hr tablet, Take 0.375 mg by mouth daily., Disp: ,  Rfl:  .  L-Lysine 500 MG TABS, Take by mouth., Disp: , Rfl:  .  valACYclovir (VALTREX) 500 MG tablet, Take 1 tablet (500 mg total) by mouth 2 (two) times daily., Disp: 30 tablet, Rfl: 1  Review of Systems:  Constitutional: Denies fever, chills, diaphoresis, appetite change and fatigue.  HEENT: Denies photophobia, eye pain, redness, hearing loss, ear pain, congestion, sore throat, rhinorrhea, sneezing, mouth sores, trouble swallowing, neck pain, neck stiffness and tinnitus.   Respiratory: Denies SOB, DOE, cough, chest tightness,  and wheezing.   Cardiovascular: Denies chest pain, palpitations and leg swelling.  Gastrointestinal: Denies nausea, vomiting, abdominal pain, diarrhea, constipation, blood in stool and abdominal distention.  Genitourinary: Denies dysuria, urgency, frequency, hematuria, flank pain and difficulty urinating.  Endocrine: Denies: hot or cold intolerance, sweats, changes in hair or nails, polyuria, polydipsia. Musculoskeletal: Denies myalgias, back pain, joint swelling, arthralgias and gait problem.  Skin: Denies pallor, rash and wound.  Neurological: Denies dizziness, seizures, syncope, weakness, light-headedness, numbness and headaches.  Hematological: Denies adenopathy. Easy bruising, personal or family bleeding history  Psychiatric/Behavioral: Denies suicidal ideation, mood changes, confusion, nervousness, sleep disturbance and agitation    Physical Exam: Vitals:   06/05/20 1034  BP: 120/74  Pulse: 62  Temp: 98.1 F (36.7 C)  TempSrc: Oral  SpO2: 99%  Weight: 182 lb 3.2 oz (82.6 kg)    Body mass index is 33.32 kg/m.   Constitutional: NAD, calm, comfortable Eyes: PERRL, lids and conjunctivae normal, wears corrective lenses ENMT: Mucous membranes are moist.  Respiratory: clear to auscultation bilaterally, no wheezing, no crackles. Normal respiratory effort. No accessory muscle use.  Cardiovascular: Regular rate and rhythm, no murmurs / rubs / gallops. No  extremity edema.  Psychiatric: Normal judgment and insight. Alert and oriented x 3. Normal mood.    Impression and Plan:  IGT (impaired glucose tolerance) -A1c is 5.5 today.  Hyperlipidemia, unspecified hyperlipidemia type  - Plan: Lipid panel -For now continue atorvastatin 10 mg daily    Patient Instructions  -Nice seeing you today!!  -Lab work today; will notify you once results are available.       Lelon Frohlich, MD [facility]

## 2020-06-05 NOTE — Patient Instructions (Signed)
-  Nice seeing you today!!  -Lab work today; will notify you once results are available.

## 2020-06-19 DIAGNOSIS — R1032 Left lower quadrant pain: Secondary | ICD-10-CM | POA: Diagnosis not present

## 2020-07-08 DIAGNOSIS — H40013 Open angle with borderline findings, low risk, bilateral: Secondary | ICD-10-CM | POA: Diagnosis not present

## 2020-07-08 DIAGNOSIS — H52203 Unspecified astigmatism, bilateral: Secondary | ICD-10-CM | POA: Diagnosis not present

## 2020-07-08 DIAGNOSIS — Z961 Presence of intraocular lens: Secondary | ICD-10-CM | POA: Diagnosis not present

## 2020-07-19 ENCOUNTER — Telehealth: Payer: Self-pay | Admitting: Internal Medicine

## 2020-07-19 NOTE — Chronic Care Management (AMB) (Signed)
  Chronic Care Management   Note  07/19/2020 Name: Brianna Mack MRN: [id number] DOB: 1945/08/24  Brianna Mack is a 75 y.o. year old female who is a primary care patient of Isaac Bliss, Rayford Halsted, MD. I reached out to Odis Hollingshead by phone today in response to a referral sent by Ms. Candyce Churn Karaffa's PCP, Isaac Bliss, Rayford Halsted, MD.   Brianna Mack was given information about Chronic Care Management services today including:  1. CCM service includes personalized support from designated clinical staff supervised by her physician, including individualized plan of care and coordination with other care providers 2. 24/7 contact phone numbers for assistance for urgent and routine care needs. 3. Service will only be billed when office clinical staff spend 20 minutes or more in a month to coordinate care. 4. Only one practitioner may furnish and bill the service in a calendar month. 5. The patient may stop CCM services at any time (effective at the end of the month) by phone call to the office staff.   Patient agreed to services and verbal consent obtained.   Follow up plan:   Tatjana Secretary/administrator

## 2020-07-21 ENCOUNTER — Other Ambulatory Visit: Payer: Self-pay | Admitting: Internal Medicine

## 2020-07-21 DIAGNOSIS — E785 Hyperlipidemia, unspecified: Secondary | ICD-10-CM

## 2020-07-22 ENCOUNTER — Other Ambulatory Visit: Payer: Self-pay

## 2020-07-23 ENCOUNTER — Encounter: Payer: Self-pay | Admitting: Internal Medicine

## 2020-07-23 ENCOUNTER — Ambulatory Visit (INDEPENDENT_AMBULATORY_CARE_PROVIDER_SITE_OTHER): Payer: Medicare Other | Admitting: Internal Medicine

## 2020-07-23 VITALS — BP 130/70 | HR 55 | Temp 98.1°F | Ht 62.99 in | Wt 178.2 lb

## 2020-07-23 DIAGNOSIS — E559 Vitamin D deficiency, unspecified: Secondary | ICD-10-CM | POA: Diagnosis not present

## 2020-07-23 DIAGNOSIS — E785 Hyperlipidemia, unspecified: Secondary | ICD-10-CM | POA: Diagnosis not present

## 2020-07-23 DIAGNOSIS — Z0000 Encounter for general adult medical examination without abnormal findings: Secondary | ICD-10-CM | POA: Diagnosis not present

## 2020-07-23 DIAGNOSIS — F341 Dysthymic disorder: Secondary | ICD-10-CM | POA: Diagnosis not present

## 2020-07-23 DIAGNOSIS — R7302 Impaired glucose tolerance (oral): Secondary | ICD-10-CM | POA: Diagnosis not present

## 2020-07-23 DIAGNOSIS — F339 Major depressive disorder, recurrent, unspecified: Secondary | ICD-10-CM | POA: Diagnosis not present

## 2020-07-23 DIAGNOSIS — K219 Gastro-esophageal reflux disease without esophagitis: Secondary | ICD-10-CM | POA: Diagnosis not present

## 2020-07-23 LAB — COMPREHENSIVE METABOLIC PANEL
ALT: 12 U/L (ref 0–35)
AST: 15 U/L (ref 0–37)
Albumin: 4.1 g/dL (ref 3.5–5.2)
Alkaline Phosphatase: 83 U/L (ref 39–117)
BUN: 17 mg/dL (ref 6–23)
CO2: 26 mEq/L (ref 19–32)
Calcium: 9.3 mg/dL (ref 8.4–10.5)
Chloride: 105 mEq/L (ref 96–112)
Creatinine, Ser: 0.71 mg/dL (ref 0.40–1.20)
GFR: 84.04 mL/min (ref >60.00)
Glucose, Bld: 87 mg/dL (ref 70–99)
Potassium: 4.3 mEq/L (ref 3.5–5.1)
Sodium: 142 mEq/L (ref 135–145)
Total Bilirubin: 0.5 mg/dL (ref 0.2–1.2)
Total Protein: 6.6 g/dL (ref 6.0–8.3)

## 2020-07-23 LAB — CBC WITH DIFFERENTIAL/PLATELET
Basophils Absolute: 0.1 10*3/uL (ref 0.0–0.1)
Basophils Relative: 1.9 % (ref 0.0–3.0)
Eosinophils Absolute: 0.2 10*3/uL (ref 0.0–0.7)
Eosinophils Relative: 4.2 % (ref 0.0–5.0)
HCT: 41.4 % (ref 36.0–46.0)
Hemoglobin: 13.7 g/dL (ref 12.0–15.0)
Lymphocytes Relative: 23.3 % (ref 12.0–46.0)
Lymphs Abs: 1.4 10*3/uL (ref 0.7–4.0)
MCHC: 33 g/dL (ref 30.0–36.0)
MCV: 89.4 fl (ref 78.0–100.0)
Monocytes Absolute: 0.5 10*3/uL (ref 0.1–1.0)
Monocytes Relative: 7.9 % (ref 3.0–12.0)
Neutro Abs: 3.6 10*3/uL (ref 1.4–7.7)
Neutrophils Relative %: 62.7 % (ref 43.0–77.0)
Platelets: 305 10*3/uL (ref 150.0–400.0)
RBC: 4.63 Mil/uL (ref 3.87–5.11)
RDW: 14.3 % (ref 11.5–15.5)
WBC: 5.8 10*3/uL (ref 4.0–10.5)

## 2020-07-23 LAB — TSH: TSH: 3.96 u[IU]/mL (ref 0.35–4.50)

## 2020-07-23 LAB — VITAMIN B12: Vitamin B-12: 392 pg/mL (ref 211–911)

## 2020-07-23 LAB — VITAMIN D 25 HYDROXY (VIT D DEFICIENCY, FRACTURES): VITD: 28.41 ng/mL — ABNORMAL LOW (ref 30.00–100.00)

## 2020-07-23 NOTE — Patient Instructions (Signed)
-Nice seeing you today!!  -Lab work today; will notify you once results are available.  -Remember your 4th COVID vaccine and your shingles series at the pharmacy.  -Schedule follow up in 6 months.   Preventive Care 74 Years and Older, Female Preventive care refers to lifestyle choices and visits with your health care provider that can promote health and wellness. This includes:  A yearly physical exam. This is also called an annual wellness visit.  Regular dental and eye exams.  Immunizations.  Screening for certain conditions.  Healthy lifestyle choices, such as: ? Eating a healthy diet. ? Getting regular exercise. ? Not using drugs or products that contain nicotine and tobacco. ? Limiting alcohol use. What can I expect for my preventive care visit? Physical exam Your health care provider will check your:  Height and weight. These may be used to calculate your BMI (body mass index). BMI is a measurement that tells if you are at a healthy weight.  Heart rate and blood pressure.  Body temperature.  Skin for abnormal spots. Counseling Your health care provider may ask you questions about your:  Past medical problems.  Family's medical history.  Alcohol, tobacco, and drug use.  Emotional well-being.  Home life and relationship well-being.  Sexual activity.  Diet, exercise, and sleep habits.  History of falls.  Memory and ability to understand (cognition).  Work and work Statistician.  Pregnancy and menstrual history.  Access to firearms. What immunizations do I need? Vaccines are usually given at various ages, according to a schedule. Your health care provider will recommend vaccines for you based on your age, medical history, and lifestyle or other factors, such as travel or where you work.   What tests do I need? Blood tests  Lipid and cholesterol levels. These may be checked every 5 years, or more often depending on your overall health.  Hepatitis C  test.  Hepatitis B test. Screening  Lung cancer screening. You may have this screening every year starting at age 8 if you have a 30-pack-year history of smoking and currently smoke or have quit within the past 15 years.  Colorectal cancer screening. ? All adults should have this screening starting at age 81 and continuing until age 62. ? Your health care provider may recommend screening at age 16 if you are at increased risk. ? You will have tests every 1-10 years, depending on your results and the type of screening test.  Diabetes screening. ? This is done by checking your blood sugar (glucose) after you have not eaten for a while (fasting). ? You may have this done every 1-3 years.  Mammogram. ? This may be done every 1-2 years. ? Talk with your health care provider about how often you should have regular mammograms.  Abdominal aortic aneurysm (AAA) screening. You may need this if you are a current or former smoker.  BRCA-related cancer screening. This may be done if you have a family history of breast, ovarian, tubal, or peritoneal cancers. Other tests  STD (sexually transmitted disease) testing, if you are at risk.  Bone density scan. This is done to screen for osteoporosis. You may have this done starting at age 19. Talk with your health care provider about your test results, treatment options, and if necessary, the need for more tests. Follow these instructions at home: Eating and drinking  Eat a diet that includes fresh fruits and vegetables, whole grains, lean protein, and low-fat dairy products. Limit your intake of foods with  high amounts of sugar, saturated fats, and salt.  Take vitamin and mineral supplements as recommended by your health care provider.  Do not drink alcohol if your health care provider tells you not to drink.  If you drink alcohol: ? Limit how much you have to 0-1 drink a day. ? Be aware of how much alcohol is in your drink. In the [place] one  drink equals one 12 oz bottle of beer (355 mL), one 5 oz glass of wine (148 mL), or one 1 oz glass of hard liquor (44 mL).   Lifestyle  Take daily care of your teeth and gums. Brush your teeth every morning and night with fluoride toothpaste. Floss one time each day.  Stay active. Exercise for at least 30 minutes 5 or more days each week.  Do not use any products that contain nicotine or tobacco, such as cigarettes, e-cigarettes, and chewing tobacco. If you need help quitting, ask your health care provider.  Do not use drugs.  If you are sexually active, practice safe sex. Use a condom or other form of protection in order to prevent STIs (sexually transmitted infections).  Talk with your health care provider about taking a low-dose aspirin or statin.  Find healthy ways to cope with stress, such as: ? Meditation, yoga, or listening to music. ? Journaling. ? Talking to a trusted person. ? Spending time with friends and family. Safety  Always wear your seat belt while driving or riding in a vehicle.  Do not drive: ? If you have been drinking alcohol. Do not ride with someone who has been drinking. ? When you are tired or distracted. ? While texting.  Wear a helmet and other protective equipment during sports activities.  If you have firearms in your house, make sure you follow all gun safety procedures. What's next?  Visit your health care provider once a year for an annual wellness visit.  Ask your health care provider how often you should have your eyes and teeth checked.  Stay up to date on all vaccines. This information is not intended to replace advice given to you by your health care provider. Make sure you discuss any questions you have with your health care provider. Document Revised: 01/24/2020 Document Reviewed: 01/27/2018 Elsevier Patient Education  2021 [employer].

## 2020-07-23 NOTE — Progress Notes (Signed)
Established Patient Office Visit     This visit occurred during the SARS-CoV-2 public health emergency.  Safety protocols were in place, including screening questions prior to the visit, additional usage of staff PPE, and extensive cleaning of exam room while observing appropriate contact time as indicated for disinfecting solutions.    CC/Reason for Visit: Annual preventive exam and subsequent Medicare wellness visit  HPI: Brianna Mack is a 75 y.o. female who is coming in today for the above mentioned reasons. Past Medical History is significant for: Hyperlipidemia and impaired glucose tolerance.  She has been doing, she exercises by walking her dogs on a daily basis.  She had a colonoscopy in 2018, she is overdue for mammogram.  She has routine GYN follow-up however has been told that she no longer needs Pap smears given a complete hysterectomy.  She is due to have her fourth COVID-vaccine and her shingles vaccination series.  She was started on 10 mg of atorvastatin about 6 months ago had repeat lipids in April with favorable results.   Past Medical/Surgical History: Past Medical History:  Diagnosis Date  . Anxiety   . Arthritis   . Depression   . HSV (herpes simplex virus) anogenital infection 11/2015  . IBS (irritable bowel syndrome)   . Stress fracture     Past Surgical History:  Procedure Laterality Date  . ABDOMINAL HYSTERECTOMY  2001   BSO ; SLING PROCEDURE endometriosis/leiomyomata  . CATARACT EXTRACTION, BILATERAL    . CHOLECYSTECTOMY  1976  . DILATION AND CURETTAGE OF UTERUS    . HAND SURGERY  04-02-2011   right  . OOPHORECTOMY     BSO  . ORIF SHOULDER FRACTURE Right 06/28/2014   Procedure: RIGHT SHOULDER HEMI-ARTHROPLASTY;  Surgeon: Netta Cedars, MD;  Location: [facility];  Service: Orthopedics;  Laterality: Right;  . TONSILLECTOMY  1954    Social History:  reports that she quit smoking about 43 years ago. She has never used smokeless tobacco. She reports  current alcohol use. She reports that she does not use drugs.  Allergies: Allergies  Allergen Reactions  . Augmentin [Amoxicillin-Pot Clavulanate] Itching and Swelling    Headache  . Cephalexin Diarrhea  . Codeine Sulfate     REACTION: nausea, vomiting  . Fluconazole Itching and Swelling    REACTION: swelling and itching  . Morphine     REACTION: nausea, itching  . Oxycodone Nausea Only  . Sulfonamide Derivatives     REACTION: rash  . Amoxicillin Rash  . Cyclobenzaprine     Per patient makes her "very groggy"-unlike usual effect  . Other Swelling    Pt states shes also allergic to an antibiotic but does not know the name or have her list with her today, this antibiotic causes face swelling.    Family History:  Family History  Problem Relation Age of Onset  . Diabetes Mother   . Hypertension Mother   . Cancer Mother        Laurell Roof  . COPD Father      Current Outpatient Medications:  .  acetaminophen (TYLENOL) 500 MG tablet, Take 1 tablet (500 mg total) by mouth every 6 (six) hours as needed for mild pain or moderate pain., Disp: 30 tablet, Rfl: 0 .  ALPRAZolam (XANAX) 0.25 MG tablet, Take 1 tablet (0.25 mg total) by mouth at bedtime as needed for anxiety., Disp: 30 tablet, Rfl: 3 .  ascorbic acid (VITAMIN C) 100 MG tablet, Take by mouth., Disp: , Rfl:  .  atorvastatin (LIPITOR) 10 MG tablet, TAKE 1 TABLET ONCE DAILY., Disp: 90 tablet, Rfl: 1 .  Ca Phosphate-Cholecalciferol (CALTRATE GUMMY BITES PO), Citracal-D3 Gummies, Disp: , Rfl:  .  Calcium Carbonate-Vitamin D (CALCIUM + D PO), Take 1 tablet by mouth daily., Disp: , Rfl:  .  cetirizine (ZYRTEC) 10 MG tablet, Take by mouth., Disp: , Rfl:  .  citalopram (CELEXA) 20 MG tablet, Take 1 tablet (20 mg total) by mouth daily., Disp: 90 tablet, Rfl: 4 .  clindamycin (CLEOCIN) 150 MG capsule, Take 150 mg by mouth 4 (four) times daily., Disp: , Rfl:  .  clobetasol cream (TEMOVATE) 0.05 %, , Disp: , Rfl:  .  cyanocobalamin  1000 MCG tablet, Take by mouth., Disp: , Rfl:  .  fluocinonide gel (LIDEX) 0.05 %, Apply topically., Disp: , Rfl:  .  hyoscyamine (LEVBID) 0.375 MG 12 hr tablet, Take 0.375 mg by mouth daily., Disp: , Rfl:  .  L-Lysine 500 MG TABS, Take by mouth., Disp: , Rfl:  .  valACYclovir (VALTREX) 500 MG tablet, Take 1 tablet (500 mg total) by mouth 2 (two) times daily., Disp: 30 tablet, Rfl: 1  Review of Systems:  Constitutional: Denies fever, chills, diaphoresis, appetite change and fatigue.  HEENT: Denies photophobia, eye pain, redness, hearing loss, ear pain, congestion, sore throat, rhinorrhea, sneezing, mouth sores, trouble swallowing, neck pain, neck stiffness and tinnitus.   Respiratory: Denies SOB, DOE, cough, chest tightness,  and wheezing.   Cardiovascular: Denies chest pain, palpitations and leg swelling.  Gastrointestinal: Denies nausea, vomiting, abdominal pain, diarrhea, constipation, blood in stool and abdominal distention.  Genitourinary: Denies dysuria, urgency, frequency, hematuria, flank pain and difficulty urinating.  Endocrine: Denies: hot or cold intolerance, sweats, changes in hair or nails, polyuria, polydipsia. Musculoskeletal: Denies myalgias, back pain, joint swelling, arthralgias and gait problem.  Skin: Denies pallor, rash and wound.  Neurological: Denies dizziness, seizures, syncope, weakness, light-headedness, numbness and headaches.  Hematological: Denies adenopathy. Easy bruising, personal or family bleeding history  Psychiatric/Behavioral: Denies suicidal ideation, mood changes, confusion, nervousness, sleep disturbance and agitation    Physical Exam: Vitals:   07/23/20 0837  BP: 130/70  Pulse: (!) 55  Temp: 98.1 F (36.7 C)  TempSrc: Oral  SpO2: 97%  Weight: 178 lb 3.2 oz (80.8 kg)  Height: 5' 2.99" (1.6 m)    Body mass index is 31.57 kg/m.   Constitutional: NAD, calm, comfortable Eyes: PERRL, lids and conjunctivae normal, wears corrective  lenses ENMT: Mucous membranes are moist. Posterior pharynx clear of any exudate or lesions. Normal dentition. Tympanic membrane is pearly white, no erythema or bulging. Neck: normal, supple, no masses, no thyromegaly Respiratory: clear to auscultation bilaterally, no wheezing, no crackles. Normal respiratory effort. No accessory muscle use.  Cardiovascular: Regular rate and rhythm, no murmurs / rubs / gallops. No extremity edema. 2+ pedal pulses. No carotid bruits.  Abdomen: no tenderness, no masses palpated. No hepatosplenomegaly. Bowel sounds positive.  Musculoskeletal: no clubbing / cyanosis. No joint deformity upper and lower extremities. Good ROM, no contractures. Normal muscle tone.  Skin: no rashes, lesions, ulcers. No induration Neurologic: CN 2-12 grossly intact. Sensation intact, DTR normal. Strength 5/5 in all 4.  Psychiatric: Normal judgment and insight. Alert and oriented x 3. Normal mood.    Subsequent Medicare wellness visit   1. Risk factors, based on past  M,S,F -cardiovascular disease risk factors include age, history of hyperlipidemia, history of impaired glucose tolerance   2.  Physical activities: Walks her dog on a  daily basis   3.  Depression/mood:  History of depression but currently mood is stable   4.  Hearing:  No perceived issues   5.  ADL's: Independent in all ADLs   6.  Fall risk:  Low fall risk   7.  Home safety: No problems identified   8.  Height weight, and visual acuity: height and weight as above, vision:   Visual Acuity Screening   Right eye Left eye Both eyes  Without correction:     With correction: '20/32 20/25 20/20 '     9.  Counseling:  Advise she update her vaccinations   10. Lab orders based on risk factors: Laboratory update will be reviewed   11. Referral :  None today   12. Care plan:  Follow-up with me in 6 months   13. Cognitive assessment:  No cognitive impairment   14. Screening: Patient provided with a written and  personalized 5-10 year screening schedule in the AVS.   yes   15. Provider List Update:   PCP, ophthalmologist  16. Advance Directives: Full code   17. Opioids: Patient is not on any opioid prescriptions and has no risk factors for a substance use disorder.   [place] Office Visit from 07/23/2020 in [place] at [place]  PHQ-9 Total Score 4      Fall Risk  12/27/2019 08/17/2019 07/18/2019 01/21/2018 07/06/2017  Falls in the past year? 1 0 0 0 No  Number falls in past yr: 0 - 0 0 -  Injury with Fall? 1 - 0 0 -  Follow up - - - - -     Impression and Plan:  Encounter for preventive health examination  -She has routine eye and dental care. -She is due for her fourth COVID booster and her shingles vaccination series.  She will obtain these at pharmacy.  Otherwise immunizations are up-to-date. -Screening labs today. -Healthy lifestyle discussed in detail. -She had a colonoscopy in 2019 and is a 5-year callback. -She is overdue for screening mammogram, will order. -Per GYN no further Pap smears required.  Hyperlipidemia, unspecified hyperlipidemia type  -Recent lipid panel with total cholesterol 160, triglycerides 72 and LDL 77. -On atorvastatin 10 mg daily.  IGT (impaired glucose tolerance) -A1c was 5.5 in April.  Gastroesophageal reflux disease without esophagitis -Symptoms are well controlled, she is not on daily PPI therapy.  ANXIETY DEPRESSION  -Mood is stable, continue Celexa and as needed alprazolam.   Patient Instructions   -Nice seeing you today!!  -Lab work today; will notify you once results are available.  -Remember your 4th COVID vaccine and your shingles series at the pharmacy.  -Schedule follow up in 6 months.   Preventive Care 56 Years and Older, Female Preventive care refers to lifestyle choices and visits with your health care provider that can promote health and wellness. This includes:  A yearly physical exam. This is also called an  annual wellness visit.  Regular dental and eye exams.  Immunizations.  Screening for certain conditions.  Healthy lifestyle choices, such as: ? Eating a healthy diet. ? Getting regular exercise. ? Not using drugs or products that contain nicotine and tobacco. ? Limiting alcohol use. What can I expect for my preventive care visit? Physical exam Your health care provider will check your:  Height and weight. These may be used to calculate your BMI (body mass index). BMI is a measurement that tells if you are at a healthy weight.  Heart rate and blood  pressure.  Body temperature.  Skin for abnormal spots. Counseling Your health care provider may ask you questions about your:  Past medical problems.  Family's medical history.  Alcohol, tobacco, and drug use.  Emotional well-being.  Home life and relationship well-being.  Sexual activity.  Diet, exercise, and sleep habits.  History of falls.  Memory and ability to understand (cognition).  Work and work Statistician.  Pregnancy and menstrual history.  Access to firearms. What immunizations do I need? Vaccines are usually given at various ages, according to a schedule. Your health care provider will recommend vaccines for you based on your age, medical history, and lifestyle or other factors, such as travel or where you work.   What tests do I need? Blood tests  Lipid and cholesterol levels. These may be checked every 5 years, or more often depending on your overall health.  Hepatitis C test.  Hepatitis B test. Screening  Lung cancer screening. You may have this screening every year starting at age 65 if you have a 30-pack-year history of smoking and currently smoke or have quit within the past 15 years.  Colorectal cancer screening. ? All adults should have this screening starting at age 63 and continuing until age 98. ? Your health care provider may recommend screening at age 65 if you are at increased  risk. ? You will have tests every 1-10 years, depending on your results and the type of screening test.  Diabetes screening. ? This is done by checking your blood sugar (glucose) after you have not eaten for a while (fasting). ? You may have this done every 1-3 years.  Mammogram. ? This may be done every 1-2 years. ? Talk with your health care provider about how often you should have regular mammograms.  Abdominal aortic aneurysm (AAA) screening. You may need this if you are a current or former smoker.  BRCA-related cancer screening. This may be done if you have a family history of breast, ovarian, tubal, or peritoneal cancers. Other tests  STD (sexually transmitted disease) testing, if you are at risk.  Bone density scan. This is done to screen for osteoporosis. You may have this done starting at age 77. Talk with your health care provider about your test results, treatment options, and if necessary, the need for more tests. Follow these instructions at home: Eating and drinking  Eat a diet that includes fresh fruits and vegetables, whole grains, lean protein, and low-fat dairy products. Limit your intake of foods with high amounts of sugar, saturated fats, and salt.  Take vitamin and mineral supplements as recommended by your health care provider.  Do not drink alcohol if your health care provider tells you not to drink.  If you drink alcohol: ? Limit how much you have to 0-1 drink a day. ? Be aware of how much alcohol is in your drink. In the [place] one drink equals one 12 oz bottle of beer (355 mL), one 5 oz glass of wine (148 mL), or one 1 oz glass of hard liquor (44 mL).   Lifestyle  Take daily care of your teeth and gums. Brush your teeth every morning and night with fluoride toothpaste. Floss one time each day.  Stay active. Exercise for at least 30 minutes 5 or more days each week.  Do not use any products that contain nicotine or tobacco, such as cigarettes,  e-cigarettes, and chewing tobacco. If you need help quitting, ask your health care provider.  Do not use drugs.  If you are sexually active, practice safe sex. Use a condom or other form of protection in order to prevent STIs (sexually transmitted infections).  Talk with your health care provider about taking a low-dose aspirin or statin.  Find healthy ways to cope with stress, such as: ? Meditation, yoga, or listening to music. ? Journaling. ? Talking to a trusted person. ? Spending time with friends and family. Safety  Always wear your seat belt while driving or riding in a vehicle.  Do not drive: ? If you have been drinking alcohol. Do not ride with someone who has been drinking. ? When you are tired or distracted. ? While texting.  Wear a helmet and other protective equipment during sports activities.  If you have firearms in your house, make sure you follow all gun safety procedures. What's next?  Visit your health care provider once a year for an annual wellness visit.  Ask your health care provider how often you should have your eyes and teeth checked.  Stay up to date on all vaccines. This information is not intended to replace advice given to you by your health care provider. Make sure you discuss any questions you have with your health care provider. Document Revised: 01/24/2020 Document Reviewed: 01/27/2018 Elsevier Patient Education  2021 Hernandez, MD [facility]

## 2020-07-24 ENCOUNTER — Other Ambulatory Visit: Payer: Self-pay | Admitting: Internal Medicine

## 2020-07-24 ENCOUNTER — Encounter: Payer: Self-pay | Admitting: Internal Medicine

## 2020-07-24 DIAGNOSIS — E559 Vitamin D deficiency, unspecified: Secondary | ICD-10-CM

## 2020-07-24 MED ORDER — VITAMIN D (ERGOCALCIFEROL) 1.25 MG (50000 UNIT) PO CAPS: 50000.0000 [IU] | ORAL_CAPSULE | ORAL | 0 refills | Status: AC

## 2020-09-19 ENCOUNTER — Telehealth: Payer: Self-pay | Admitting: Internal Medicine

## 2020-09-19 DIAGNOSIS — R7302 Impaired glucose tolerance (oral): Secondary | ICD-10-CM

## 2020-09-19 NOTE — Telephone Encounter (Signed)
PT called to get her A1C checked. Currently do not see any orders on file. Please advise.

## 2020-09-19 NOTE — Addendum Note (Signed)
Addended by: Westley Hummer B on: 09/19/2020 04:58 PM   Modules accepted: Orders

## 2020-09-19 NOTE — Telephone Encounter (Signed)
Spoke with patient. Lab appointment and order placed.

## 2020-09-20 ENCOUNTER — Other Ambulatory Visit: Payer: Self-pay

## 2020-09-20 ENCOUNTER — Other Ambulatory Visit (INDEPENDENT_AMBULATORY_CARE_PROVIDER_SITE_OTHER): Payer: Medicare Other

## 2020-09-20 DIAGNOSIS — R7302 Impaired glucose tolerance (oral): Secondary | ICD-10-CM

## 2020-09-20 LAB — HEMOGLOBIN A1C: Hgb A1c MFr Bld: 5.9 % (ref 4.6–6.5)

## 2020-09-25 ENCOUNTER — Telehealth: Payer: Self-pay | Admitting: Pharmacist

## 2020-09-25 NOTE — Chronic Care Management (AMB) (Signed)
Chronic Care Management Pharmacy Assistant   Name: Brianna Mack  MRN: [id number] DOB: 04-01-1945  Reason for Encounter: Chart review for initial visit with Clinical Pharmacist Jeni Salles on 09-30-2020 in office at 3:30   Conditions to be addressed/monitored: HLD, Anxiety, and GERD and Vitamin D Deficiency   Recent office visits:  07-23-2020 Isaac Bliss, Rayford Halsted, MD - Patient presented for preventative exam and other concerns  06-05-2020 Isaac Bliss, Rayford Halsted, MD - Patient presented for impaired glucose tolerance. No medication changes.  Recent consult visits:  07-08-2020 Hyman Hopes (Ophthalmology) - Patient presented for Glaucoma Evaluation. No medication changes  06-19-2020 Medoff, Tawni Pummel (Gastroenterology) - Patient presented for lower left quadrant pain. No medication changes available.  Hospital visits:  None in previous 6 months  Medications: Outpatient Encounter Medications as of 09/25/2020  Medication Sig   acetaminophen (TYLENOL) 500 MG tablet Take 1 tablet (500 mg total) by mouth every 6 (six) hours as needed for mild pain or moderate pain.   ALPRAZolam (XANAX) 0.25 MG tablet Take 1 tablet (0.25 mg total) by mouth at bedtime as needed for anxiety.   ascorbic acid (VITAMIN C) 100 MG tablet Take by mouth.   atorvastatin (LIPITOR) 10 MG tablet TAKE 1 TABLET ONCE DAILY.   Ca Phosphate-Cholecalciferol (CALTRATE GUMMY BITES PO) Citracal-D3 Gummies   Calcium Carbonate-Vitamin D (CALCIUM + D PO) Take 1 tablet by mouth daily.   cetirizine (ZYRTEC) 10 MG tablet Take by mouth.   citalopram (CELEXA) 20 MG tablet Take 1 tablet (20 mg total) by mouth daily.   clindamycin (CLEOCIN) 150 MG capsule Take 150 mg by mouth 4 (four) times daily.   clobetasol cream (TEMOVATE) 0.05 %    cyanocobalamin 1000 MCG tablet Take by mouth.   fluocinonide gel (LIDEX) 0.05 % Apply topically.   hyoscyamine (LEVBID) 0.375 MG 12 hr tablet Take 0.375 mg by mouth daily.    L-Lysine 500 MG TABS Take by mouth.   valACYclovir (VALTREX) 500 MG tablet Take 1 tablet (500 mg total) by mouth 2 (two) times daily.   Vitamin D, Ergocalciferol, (DRISDOL) 1.25 MG (50000 UNIT) CAPS capsule Take 1 capsule (50,000 Units total) by mouth every 7 (seven) days for 12 doses.   No facility-administered encounter medications on file as of 09/25/2020.   Have you seen any other providers since your last visit? Patient reports none  Any changes in your medications or health? Patient reports no  Any side effects from any medications? Patient reports no she says she has been taking the same medications for several years and has no issues.  Do you have an symptoms or problems not managed by your medications? Patient reports no she thinks everything is managed and working appropriately.  Any concerns about your health right now? Patient reports she is concerned about being per-diabetic. Reports she was seeing a nutritionist in April and with her help and diet changes was able to get her A1C down. Once it was down she states the nutritionist advised her she did not need to return, she has since fallen off of her routine and her A1C has gone back up. 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 Patient reports her husband had a stroke in the past and she had to stop working  and care for him until he passed so she is a little stressed about his issue.  Has your provider asked that you check blood pressure, blood sugar, or follow special diet at home? Patient reports no but she has the testing supplies for her sugar and wants to check and see what her baseline is at home. Notified her to write dorn her readings if she decides to for upcoming appointment.  Do you get any type of exercise on a regular basis? Patient reports  she is active by walking her dogs and she belongs to a gym but has not been there since the Pandemic, would like to get back to doing so.  Can you think of a goal you would like to reach for your health? She reports she would like to better manage her (A 1 C) and stay on top of it as well as help with her meals as in a nutritionist.   Do you have any problems getting your medications? Patient reports no Is there anything that you would like to discuss during the appointment? Patient reports no other than the above.  She is aware to bring all supplements and medications to appointment and that she will receive a reminder call the day prior.   Fill History  : alprazolam 0.25 mg tablet 07/04/2020 30   atorvastatin 10 mg tablet 07/22/2020 90   citalopram 20 mg tablet 08/11/2020 90   CLOBETASOL PROPIONATE  0.05 % CREA 09/08/2018 15   FLUOCINONIDE 0.05% GEL 08/08/2019 15   hyoscyamine ER 0.375 mg tablet,extended release,12 hr 09/09/2020 30   valacyclovir 500 mg tablet 07/04/2020 15   Vitamin D2 1,250 mcg (50,000 unit) capsule 07/24/2020 84   Care Gaps: Hepatitis C Screening - Overdue Zoster Vaccine - Overdue Flu Vaccine - Overdue  Star Rating Drugs: Atorvastatin (Lipitor) 10 mg - Last filled 07-22-2020 90 DS at [place] Pharmacist Assistant [phone]

## 2020-09-27 ENCOUNTER — Telehealth: Payer: Self-pay | Admitting: Pharmacist

## 2020-09-27 NOTE — Chronic Care Management (AMB) (Signed)
    Chronic Care Management Pharmacy Assistant   Name: Brianna Mack  MRN: [id number] DOB: [birth year]  09-27-2020- Patient called to remind of appointment with Jeni Salles Clinical Pharmacist on 09-30-2020 at 3:30 in office  No answer, left message of appointment date, time and type of appointment . Left message to bring all medications that do not need to be refrigerated, supplements, blood pressure and/or blood sugar logs to appointment and to return call if needed to reschedule.  Care Gaps: Hepatitis C Screening - Overdue Zoster Vaccine - Overdue Flu Vaccine - Overdue AWV - MSG sent to Ramond Craver CMA to schedule.  Star Rating Drug: Atorvastatin (Lipitor) 10 mg - Last filled 07-22-2020 90 [facility] at [facility]  Any gaps in medications fill history? alprazolam 0.25 mg tablet 07/04/2020 30     Medications: Outpatient Encounter Medications as of 09/27/2020  Medication Sig   acetaminophen (TYLENOL) 500 MG tablet Take 1 tablet (500 mg total) by mouth every 6 (six) hours as needed for mild pain or moderate pain.   ALPRAZolam (XANAX) 0.25 MG tablet Take 1 tablet (0.25 mg total) by mouth at bedtime as needed for anxiety.   ascorbic acid (VITAMIN C) 100 MG tablet Take by mouth.   atorvastatin (LIPITOR) 10 MG tablet TAKE 1 TABLET ONCE DAILY.   Ca Phosphate-Cholecalciferol (CALTRATE GUMMY BITES PO) Citracal-D3 Gummies   Calcium Carbonate-Vitamin D (CALCIUM + D PO) Take 1 tablet by mouth daily.   cetirizine (ZYRTEC) 10 MG tablet Take by mouth.   citalopram (CELEXA) 20 MG tablet Take 1 tablet (20 mg total) by mouth daily.   clindamycin (CLEOCIN) 150 MG capsule Take 150 mg by mouth 4 (four) times daily.   clobetasol cream (TEMOVATE) 0.05 %    cyanocobalamin 1000 MCG tablet Take by mouth.   fluocinonide gel (LIDEX) 0.05 % Apply topically.   hyoscyamine (LEVBID) 0.375 MG 12 hr tablet Take 0.375 mg by mouth daily.   L-Lysine 500 MG TABS Take by mouth.   valACYclovir (VALTREX) 500 MG tablet  Take 1 tablet (500 mg total) by mouth 2 (two) times daily.   Vitamin D, Ergocalciferol, (DRISDOL) 1.25 MG (50000 UNIT) CAPS capsule Take 1 capsule (50,000 Units total) by mouth every 7 (seven) days for 12 doses.   No facility-administered encounter medications on file as of 09/27/2020.   [facility] Clinical Pharmacist Assistant [phone]

## 2020-09-30 ENCOUNTER — Ambulatory Visit (INDEPENDENT_AMBULATORY_CARE_PROVIDER_SITE_OTHER): Payer: Medicare Other | Admitting: Pharmacist

## 2020-09-30 ENCOUNTER — Other Ambulatory Visit: Payer: Self-pay

## 2020-09-30 DIAGNOSIS — E559 Vitamin D deficiency, unspecified: Secondary | ICD-10-CM

## 2020-09-30 DIAGNOSIS — E785 Hyperlipidemia, unspecified: Secondary | ICD-10-CM | POA: Diagnosis not present

## 2020-09-30 NOTE — Progress Notes (Signed)
Chronic Care Management Pharmacy Note  10/12/2020 Name:  Brianna Mack MRN:  [id number] DOB:  September 02, 1945  Summary: Pt is concerned with having prediabetes LDL at goal < 100  Recommendations/Changes made from today's visit: -Provided healthy plate handout and ADA food hub website -Recommend repeat vitamin D level and scheduled lab appointment  Plan: Follow up in 6 months  Subjective: Brianna Mack is an 75 y.o. year old female who is a primary patient of Isaac Bliss, Rayford Halsted, MD.  The CCM team was consulted for assistance with disease management and care coordination needs.    Engaged with patient face to face for initial visit in response to provider referral for pharmacy case management and/or care coordination services.   Consent to Services:  The patient was given the following information about Chronic Care Management services today, agreed to services, and gave verbal consent: 1. CCM service includes personalized support from designated clinical staff supervised by the primary care provider, including individualized plan of care and coordination with other care providers 2. 24/7 contact phone numbers for assistance for urgent and routine care needs. 3. Service will only be billed when office clinical staff spend 20 minutes or more in a month to coordinate care. 4. Only one practitioner may furnish and bill the service in a calendar month. 5.The patient may stop CCM services at any time (effective at the end of the month) by phone call to the office staff. 6. The patient will be responsible for cost sharing (co-pay) of up to 20% of the service fee (after annual deductible is met). Patient agreed to services and consent obtained.  Patient Care Team: Isaac Bliss, Rayford Halsted, MD as PCP - General (Internal Medicine) Viona Gilmore, [facility] as Pharmacist (Pharmacist)  Recent office visits: 07-23-2020 Isaac Bliss, Rayford Halsted, MD - Patient presented for preventative exam and  other concerns. Prescribed vitamin D 50000 units once weekly.   06-05-2020 Isaac Bliss, Rayford Halsted, MD - Patient presented for impaired glucose tolerance. No medication changes.  Recent consult visits: 07-08-2020 Hyman Hopes (Ophthalmology) - Patient presented for Glaucoma Evaluation. No medication changes   06-19-2020 Medoff, Tawni Pummel (Gastroenterology) - Patient presented for lower left quadrant pain. No medication changes available.  Hospital visits: None in previous 6 months   Objective:  Lab Results  Component Value Date   CREATININE 0.71 07/23/2020   BUN 17 07/23/2020   GFR 84.04 07/23/2020   GFRNONAA >60 12/14/2019   GFRAA >60 06/28/2014   NA 142 07/23/2020   K 4.3 07/23/2020   CALCIUM 9.3 07/23/2020   CO2 26 07/23/2020   GLUCOSE 87 07/23/2020    Lab Results  Component Value Date/Time   HGBA1C 5.9 09/20/2020 09:55 AM   HGBA1C 5.5 06/05/2020 10:38 AM   HGBA1C 5.3 01/25/2020 11:41 AM   HGBA1C 5.8 07/18/2019 09:40 AM   GFR 84.04 07/23/2020 09:17 AM   GFR 83.41 07/18/2019 09:40 AM    Last diabetic Eye exam: No results found for: HMDIABEYEEXA  Last diabetic Foot exam: No results found for: HMDIABFOOTEX   Lab Results  Component Value Date   CHOL 160 06/05/2020   HDL 68.30 06/05/2020   LDLCALC 77 06/05/2020   LDLDIRECT 124.9 05/27/2011   TRIG 72.0 06/05/2020   CHOLHDL 2 06/05/2020    Hepatic Function Latest Ref Rng & Units 07/23/2020 07/18/2019 07/06/2017  Total Protein 6.0 - 8.3 g/dL 6.6 6.7 6.8  Albumin 3.5 - 5.2 g/dL 4.1 4.2 4.0  AST 0 - 37  U/L _0 ALT 0 - 35 U/L _1 Alk Phosphatase 39 - 117 U/L 83 79 79  Total Bilirubin 0.2 - 1.2 mg/dL 0.5 0.4 0.6  Bilirubin, Direct 0.0 - 0.3 mg/dL - - -    Lab Results  Component Value Date/Time   TSH 3.96 07/23/2020 09:17 AM   TSH 3.76 07/18/2019 09:40 AM    CBC Latest Ref Rng & Units 07/23/2020 12/14/2019 07/18/2019  WBC 4.0 - 10.5 K/uL 5.8 12.2(H) 5.8  Hemoglobin 12.0 - 15.0 g/dL 13.7 13.9 13.3   Hematocrit 36.0 - 46.0 % 41.4 44.4 40.4  Platelets 150.0 - 400.0 K/uL 305.0 302 283.0    Lab Results  Component Value Date/Time   VD25OH 28.41 (L) 07/23/2020 09:17 AM   VD25OH 35.82 07/18/2019 09:40 AM    Clinical ASCVD: No  The 10-year ASCVD risk score Mikey Bussing DC Jr., et al., 2013) is: 14.4%   Values used to calculate the score:     Age: 30 years     Sex: Female     Is Non-Hispanic African American: No     Diabetic: No     Tobacco smoker: No     Systolic Blood Pressure: 361 mmHg     Is BP treated: No     HDL Cholesterol: 68.3 mg/dL     Total Cholesterol: 160 mg/dL    Depression screen [facility] 2/9 07/23/2020 12/27/2019 07/18/2019  Decreased Interest 0 0 0  Down, Depressed, Hopeless 0 0 0  PHQ - 2 Score 0 0 0  Altered sleeping 2 0 0  Tired, decreased energy 0 0 0  Change in appetite 2 0 0  Feeling bad or failure about yourself  0 0 0  Trouble concentrating 0 0 0  Moving slowly or fidgety/restless 0 0 0  Suicidal thoughts 0 0 0  PHQ-9 Score 4 0 0  Difficult doing work/chores Not difficult at all Not difficult at all Not difficult at all      Social History   Tobacco Use  Smoking Status Former   Types: Cigarettes   Quit date: 08/03/1977   Years since quitting: 43.2  Smokeless Tobacco Never   BP Readings from Last 3 Encounters:  07/23/20 130/70  06/05/20 120/74  01/25/20 120/72   Pulse Readings from Last 3 Encounters:  07/23/20 (!) 55  06/05/20 62  01/25/20 74   Wt Readings from Last 3 Encounters:  07/23/20 178 lb 3.2 oz (80.8 kg)  06/05/20 182 lb 3.2 oz (82.6 kg)  01/25/20 173 lb 6.4 oz (78.7 kg)   BMI Readings from Last 3 Encounters:  07/23/20 31.57 kg/m  06/05/20 33.32 kg/m  01/25/20 31.72 kg/m    Assessment/Interventions: Review of patient past medical history, allergies, medications, health status, including review of consultants reports, laboratory and other test data, was performed as part of comprehensive evaluation and provision of chronic care  management services.   SDOH:  (Social Determinants of Health) assessments and interventions performed: Yes SDOH Interventions    Flowsheet Row Most Recent Value  SDOH Interventions   Financial Strain Interventions Intervention Not Indicated  Transportation Interventions Intervention Not Indicated      SDOH Screenings   Alcohol Screen: Not on file  Depression (PHQ2-9): Low Risk    PHQ-2 Score: 4  Financial Resource Strain: Low Risk    Difficulty of Paying Living Expenses: Not hard at all  Food Insecurity: Not on file  Housing: Not on file  Physical Activity: Not on file  Social Connections: Not on file  Stress: Not on file  Tobacco Use: Medium Risk   Smoking Tobacco Use: Former   Smokeless Tobacco Use: Never  Transportation Needs: No Transportation Needs   Lack of Transportation (Medical): No   Lack of Transportation (Non-Medical): No   Patient gets up around 7-7:30 to let her dog out (daushaund) and sometimes goes back to bed after letting her out. She then eats breakfast and does house chores. She spends a lot of her time bringing her sister to appointments and getting her groceries and such. Her husband passed aware in October of 2017.  She does enjoy walking depending on the weather and usually goes early in the morning. She is a member of a fitness center but has not started going back. She is planing on going in the morning 2-3 days a week and still has membership. This is at [employer] and the membership is free with [employer].  CCM Care Plan  Allergies  Allergen Reactions   Augmentin [Amoxicillin-Pot Clavulanate] Itching and Swelling    Headache   Cephalexin Diarrhea   Codeine Sulfate     REACTION: nausea, vomiting   Fluconazole Itching and Swelling    REACTION: swelling and itching   Morphine     REACTION: nausea, itching   Oxycodone Nausea Only   Sulfonamide Derivatives     REACTION: rash   Amoxicillin Rash   Cyclobenzaprine     Per patient makes her  "very groggy"-unlike usual effect   Other Swelling    Pt states shes also allergic to an antibiotic but does not know the name or have her list with her today, this antibiotic causes face swelling.    Medications Reviewed Today     Reviewed by Viona Gilmore, [facility] (Pharmacist) on 09/30/20 at [place] List Status: <None>   Medication Order Taking? Sig Documenting Provider Last Dose Status Informant  acetaminophen (TYLENOL) 500 MG tablet [id number] Yes Take 1 tablet (500 mg total) by mouth every 6 (six) hours as needed for mild pain or moderate pain. [name], [name] Taking Active   ALPRAZolam Duanne Moron) 0.25 MG tablet [id number] Yes Take 1 tablet (0.25 mg total) by mouth at bedtime as needed for anxiety. Joseph Pierini, MD Taking Active   ascorbic acid (VITAMIN C) 100 MG tablet [id number] Yes Take 500 mg by mouth 2 (two) times daily. [provider] Taking Active   atorvastatin (LIPITOR) 10 MG tablet [id number] Yes TAKE 1 TABLET ONCE DAILY. Isaac Bliss, Rayford Halsted, MD Taking Active   Ca Phosphate-Cholecalciferol (CALTRATE GUMMY BITES PO) [id number] Yes 630 mg and 1000 units [provider] Taking Active   cetirizine (ZYRTEC) 10 MG tablet [id number] Yes Take 10 mg by mouth daily as needed. [provider] Taking Active   citalopram (CELEXA) 20 MG tablet [id number] Yes Take 1 tablet (20 mg total) by mouth daily. Joseph Pierini, MD Taking Active   clindamycin (CLEOCIN) 150 MG capsule [id number] Yes Take 150 mg by mouth 4 (four) times daily. Prior to dental appointments [provider] Taking Active   cyanocobalamin 1000 MCG tablet [id number] Yes Take 1,000 mcg by mouth daily. [provider] Taking Active   hyoscyamine (LEVBID) 0.375 MG 12 hr tablet [id number] Yes Take 0.375 mg by mouth 2 (two) times daily. [provider] Taking Active Self  L-Lysine 500 MG TABS [id number] Yes Take 1 tablet by mouth daily. [provider] Taking Active    Probiotic Product (ACIDOPHILUS PEARLS PO) [id number] Yes  Take 1 capsule by mouth daily. [provider] Taking Active   valACYclovir (VALTREX) 500 MG tablet [id number] Yes Take 1 tablet (500 mg total) by mouth 2 (two) times daily. Joseph Pierini, MD Taking Active   Vitamin D, Ergocalciferol, (DRISDOL) 1.25 MG (50000 UNIT) CAPS capsule [id number] Yes Take 1 capsule (50,000 Units total) by mouth every 7 (seven) days for 12 doses. Isaac Bliss, Rayford Halsted, MD Taking Active             Patient Active Problem List   Diagnosis Date Noted   Vitamin D deficiency 07/24/2020   Hyperlipidemia 07/18/2019   IGT (impaired glucose tolerance) 07/18/2019   GERD (gastroesophageal reflux disease) 05/04/2019   Pain in left knee 03/17/2018   Irritable bowel syndrome 12/15/2016   Fracture dislocation of shoulder joint 06/27/2014   ANXIETY DEPRESSION 03/26/2009    Immunization History  Administered Date(s) Administered   Fluad Quad(high Dose 65+) 11/29/2018   Influenza Split 12/15/2010, 11/19/2011, 12/22/2016   Influenza Whole 11/18/2007   Influenza, High Dose Seasonal PF 12/12/2012, 12/01/2013, 01/21/2015, 01/02/2016, 12/10/2017, 11/29/2018   Influenza,inj,Quad PF,6+ Mos 12/22/2016, 12/27/2019   Influenza-Unspecified 11/17/2019   PFIZER(Purple Top)SARS-COV-2 Vaccination 03/11/2019, 04/01/2019, 12/06/2019, 05/27/2020   Pneumococcal Conjugate-13 09/07/2014   Pneumococcal Polysaccharide-23 04/21/2016, 11/18/2018   Tdap 06/27/2014    Conditions to be addressed/monitored:  Hyperlipidemia, GERD, Depression, Anxiety, and Prediabetes, vitamin D deficiency  Care Plan : [place]  Updates made by Viona Gilmore, [place] since 10/12/2020 12:00 AM     Problem: Problem: Hyperlipidemia, GERD, Depression, Anxiety, and Prediabetes, vitamin D deficiency      Long-Range Goal: Patient-Specific Goal   Start Date: 09/30/2020  Expected End Date: 09/30/2021  This Visit's Progress: On track   Priority: High  Note:   Current Barriers:  Unable to independently monitor therapeutic efficacy  Pharmacist Clinical Goal(s):  Patient will achieve adherence to monitoring guidelines and medication adherence to achieve therapeutic efficacy through collaboration with PharmD and provider.   Interventions: 1:1 collaboration with Isaac Bliss, Rayford Halsted, MD regarding development and update of comprehensive plan of care as evidenced by provider attestation and co-signature Inter-disciplinary care team collaboration (see longitudinal plan of care) Comprehensive medication review performed; medication list updated in electronic medical record  Hyperlipidemia: (LDL goal < 100) -Controlled -Current treatment: Atorvastatin 10 mg daily -Medications previously tried: none  -Current dietary patterns: not much beef; more chicken and salmon; lots of salads with salmon and vegetables -Current exercise habits: walking dog; plans to go back to the gym -Educated on Cholesterol goals;  Benefits of statin for ASCVD risk reduction; Importance of limiting foods high in cholesterol; Exercise goal of 150 minutes per week; -Counseled on diet and exercise extensively Recommended to continue current medication  Pre-diabetes (A1c goal <6.5%) -Controlled -Current medications: No medications -Medications previously tried: none  -Current home glucose readings fasting glucose: does not need to check post prandial glucose: does not need to check -Denies hypoglycemic/hyperglycemic symptoms -Current meal patterns:  breakfast: did not discuss  lunch: low carb wraps; veggie pizza every once in a while  dinner: salads with salmon; eats broccoli, asparagus, some roasted potatoes, onions snacks: did not discuss drinks: did not discuss -Current exercise: walking dog -Educated on A1c and blood sugar goals; Exercise goal of 150 minutes per week; Carbohydrate counting and/or plate method -Counseled to check  feet daily and get yearly eye exams -Counseled on diet and exercise extensively Provided healthy plate handout and diabetes food hub website.  Depression/Anxiety (Goal: minimize  symptoms) -Controlled -Current treatment: Alprazolam 0.25 mg 1 tablet at bedtime as needed Citalopram 20 mg 1 tablet daily -Medications previously tried/failed: none -PHQ9: 4 -GAD7: n/a -Educated on Benefits of medication for symptom control Benefits of cognitive-behavioral therapy with or without medication -Recommended to continue current medication Counseled on limiting use of alprazolam due to risks for long term use.  Osteopenia (Goal prevent fractures) -Controlled -Last DEXA Scan: 12/2016   T-Score femoral neck: -0.1  T-Score total hip: 0.1  T-Score lumbar spine: 0.7  T-Score forearm radius: -1.1  10-year probability of major osteoporotic fracture: n/a  10-year probability of hip fracture: n/a -Patient is not a candidate for pharmacologic treatment -Current treatment  Calcium and vitamin D 630 & 500 units twice daily Vitamin D2 50,000 units weekly -Medications previously tried: none  -Recommend [phone] units of vitamin D daily. Recommend 1200 mg of calcium daily from dietary and supplemental sources. Recommend weight-bearing and muscle strengthening exercises for building and maintaining bone density. -Counseled on diet and exercise extensively Scheduled lab appointment for repeat vitamin D.  Allergic rhinitis (Goal: minimize symptoms) -Controlled -Current treatment  Cetirizine 10 mg 1 tablet daily seasonally -Medications previously tried: none  -Recommended to continue current medication  IBS (Goal: minimize symptoms) -Controlled -Current treatment  Hyoscamine 0.125 only as needed (for foods like pizza) Hyoscamine 0.375 mg twice daily -Medications previously tried: none  -Recommended to continue current medication   Health Maintenance -Vaccine gaps: shingrix -Current therapy:   Vitamin C 500 mg 1 tablet twice daily Vitamin B12 1000 mcg 1 tablet daily Lysine 500 mg daily (gums) Systane Probiotic as needed Benadryl - once every couple months (itching) -Educated on Cost vs benefit of each product must be carefully weighed by individual consumer -Patient is satisfied with current therapy and denies issues -Recommended to continue current medication  Patient Goals/Self-Care Activities Patient will:  - take medications as prescribed target a minimum of 150 minutes of moderate intensity exercise weekly engage in dietary modifications by increasing vegetable intake and aiming for ideal plate model  Follow Up Plan: Telephone follow up appointment with care management team member scheduled for: 6 months       Medication Assistance: None required.  Patient affirms current coverage meets needs.  Compliance/Adherence/Medication fill history: Care Gaps: Hep C screening, shingrix, influenza  Star-Rating Drugs: Atorvastatin (Lipitor) 10 mg - Last filled 07-22-2020 90 DS at [facility]  Patient's preferred pharmacy is:  [facility], [place] [address] Phone: [phone] Fax: [phone]  Uses pill box? Yes - split AM/PM; 2 weeks at at time Pt endorses 99% compliance   We discussed: Current pharmacy is preferred with insurance plan and patient is satisfied with pharmacy services Patient decided to: Continue current medication management strategy  Care Plan and Follow Up Patient Decision:  Patient agrees to Care Plan and Follow-up.  Plan: Telephone follow up appointment with care management team member scheduled for:  6 months  Jeni Salles, PharmD, [facility] Pharmacist [place] at [place] [phone]

## 2020-10-12 NOTE — Patient Instructions (Addendum)
Hi Brianna Mack,  It was great to get to meet you in person! Below is a summary of some of the topics we discussed.   Here is the website I referred to that has a lot of recipes and resources for patients with diabetes: [email or web address]   Hope this helps!  Please reach out to me if you have any questions or need anything before our follow up!  Best, Maddie  Jeni Salles, PharmD, [name] [facility] [place]   Visit Information   Goals Addressed   None    Patient Care Plan: CCM Pharmacy Care Plan     Problem Identified: Problem: Hyperlipidemia, GERD, Depression, Anxiety, and Prediabetes, vitamin D deficiency      Long-Range Goal: Patient-Specific Goal   Start Date: 09/30/2020  Expected End Date: 09/30/2021  This Visit's Progress: On track  Priority: High  Note:   Current Barriers:  Unable to independently monitor therapeutic efficacy  Pharmacist Clinical Goal(s):  Patient will achieve adherence to monitoring guidelines and medication adherence to achieve therapeutic efficacy through collaboration with PharmD and provider.   Interventions: 1:1 collaboration with Isaac Bliss, Rayford Halsted, MD regarding development and update of comprehensive plan of care as evidenced by provider attestation and co-signature Inter-disciplinary care team collaboration (see longitudinal plan of care) Comprehensive medication review performed; medication list updated in electronic medical record  Hyperlipidemia: (LDL goal < 100) -Controlled -Current treatment: Atorvastatin 10 mg daily -Medications previously tried: none  -Current dietary patterns: not much beef; more chicken and salmon; lots of salads with salmon and vegetables -Current exercise habits: walking dog; plans to go back to the gym -Educated on Cholesterol goals;  Benefits of statin for ASCVD risk reduction; Importance of limiting foods high in cholesterol; Exercise goal of  150 minutes per week; -Counseled on diet and exercise extensively Recommended to continue current medication  Pre-diabetes (A1c goal <6.5%) -Controlled -Current medications: No medications -Medications previously tried: none  -Current home glucose readings fasting glucose: does not need to check post prandial glucose: does not need to check -Denies hypoglycemic/hyperglycemic symptoms -Current meal patterns:  breakfast: did not discuss  lunch: low carb wraps; veggie pizza every once in a while  dinner: salads with salmon; eats broccoli, asparagus, some roasted potatoes, onions snacks: did not discuss drinks: did not discuss -Current exercise: walking dog -Educated on A1c and blood sugar goals; Exercise goal of 150 minutes per week; Carbohydrate counting and/or plate method -Counseled to check feet daily and get yearly eye exams -Counseled on diet and exercise extensively Provided healthy plate handout and diabetes food hub website.  Depression/Anxiety (Goal: minimize symptoms) -Controlled -Current treatment: Alprazolam 0.25 mg 1 tablet at bedtime as needed Citalopram 20 mg 1 tablet daily -Medications previously tried/failed: none -PHQ9: 4 -GAD7: n/a -Educated on Benefits of medication for symptom control Benefits of cognitive-behavioral therapy with or without medication -Recommended to continue current medication Counseled on limiting use of alprazolam due to risks for long term use.  Osteopenia (Goal prevent fractures) -Controlled -Last DEXA Scan: 12/2016   T-Score femoral neck: -0.1  T-Score total hip: 0.1  T-Score lumbar spine: 0.7  T-Score forearm radius: -1.1  10-year probability of major osteoporotic fracture: n/a  10-year probability of hip fracture: n/a -Patient is not a candidate for pharmacologic treatment -Current treatment  Calcium and vitamin D 630 & 500 units twice daily Vitamin D2 50,000 units weekly -Medications previously tried: none  -Recommend  [phone] units of vitamin D daily. Recommend 1200 mg of calcium daily  from dietary and supplemental sources. Recommend weight-bearing and muscle strengthening exercises for building and maintaining bone density. -Counseled on diet and exercise extensively Scheduled lab appointment for repeat vitamin D.  Allergic rhinitis (Goal: minimize symptoms) -Controlled -Current treatment  Cetirizine 10 mg 1 tablet daily seasonally -Medications previously tried: none  -Recommended to continue current medication  IBS (Goal: minimize symptoms) -Controlled -Current treatment  Hyoscamine 0.125 only as needed (for foods like pizza) Hyoscamine 0.375 mg twice daily -Medications previously tried: none  -Recommended to continue current medication   Health Maintenance -Vaccine gaps: shingrix -Current therapy:  Vitamin C 500 mg 1 tablet twice daily Vitamin B12 1000 mcg 1 tablet daily Lysine 500 mg daily (gums) Systane Probiotic as needed Benadryl - once every couple months (itching) -Educated on Cost vs benefit of each product must be carefully weighed by individual consumer -Patient is satisfied with current therapy and denies issues -Recommended to continue current medication  Patient Goals/Self-Care Activities Patient will:  - take medications as prescribed target a minimum of 150 minutes of moderate intensity exercise weekly engage in dietary modifications by increasing vegetable intake and aiming for ideal plate model  Follow Up Plan: Telephone follow up appointment with care management team member scheduled for: 6 months      Brianna Mack was given information about Chronic Care Management services today including:  CCM service includes personalized support from designated clinical staff supervised by her physician, including individualized plan of care and coordination with other care providers 24/7 contact phone numbers for assistance for urgent and routine care needs. Standard insurance,  coinsurance, copays and deductibles apply for chronic care management only during months in which we provide at least 20 minutes of these services. Most insurances cover these services at 100%, however patients may be responsible for any copay, coinsurance and/or deductible if applicable. This service may help you avoid the need for more expensive face-to-face services. Only one practitioner may furnish and bill the service in a calendar month. The patient may stop CCM services at any time (effective at the end of the month) by phone call to the office staff.  Patient agreed to services and verbal consent obtained.   Patient verbalizes understanding of instructions provided today and agrees to view in [place].  Telephone follow up appointment with pharmacy team member scheduled for: 6 months  Viona Gilmore, [facility]

## 2020-10-14 DIAGNOSIS — Z1231 Encounter for screening mammogram for malignant neoplasm of breast: Secondary | ICD-10-CM | POA: Diagnosis not present

## 2020-10-14 LAB — HM MAMMOGRAPHY

## 2020-10-15 ENCOUNTER — Telehealth: Payer: Self-pay | Admitting: *Deleted

## 2020-10-15 ENCOUNTER — Other Ambulatory Visit: Payer: Self-pay | Admitting: Internal Medicine

## 2020-10-15 DIAGNOSIS — E559 Vitamin D deficiency, unspecified: Secondary | ICD-10-CM

## 2020-10-15 NOTE — Telephone Encounter (Signed)
-----   Message from Viona Gilmore, [facility] sent at 10/12/2020 10:45 AM EDT ----- Regarding: vitamin D level Hi,  When I saw her she got herself scheduled for her repeat vitamin D after being on a high dose but I don't see the order for it. Can you put that in please? It looks like Dr. Jerilee Hoh put it in her notes to get repeat levels but the order was just never placed.  Thanks! Maddie

## 2020-10-15 NOTE — Telephone Encounter (Signed)
Lab order placed.

## 2020-10-18 ENCOUNTER — Encounter: Payer: Self-pay | Admitting: Internal Medicine

## 2020-10-23 ENCOUNTER — Other Ambulatory Visit: Payer: Self-pay

## 2020-10-23 ENCOUNTER — Other Ambulatory Visit (INDEPENDENT_AMBULATORY_CARE_PROVIDER_SITE_OTHER): Payer: Medicare Other

## 2020-10-23 DIAGNOSIS — E559 Vitamin D deficiency, unspecified: Secondary | ICD-10-CM | POA: Diagnosis not present

## 2020-10-23 LAB — VITAMIN D 25 HYDROXY (VIT D DEFICIENCY, FRACTURES): VITD: 46.35 ng/mL (ref 30.00–100.00)

## 2020-12-12 DIAGNOSIS — M25562 Pain in left knee: Secondary | ICD-10-CM | POA: Diagnosis not present

## 2020-12-12 DIAGNOSIS — M1712 Unilateral primary osteoarthritis, left knee: Secondary | ICD-10-CM | POA: Diagnosis not present

## 2020-12-24 ENCOUNTER — Telehealth: Payer: Self-pay | Admitting: Pharmacist

## 2020-12-24 NOTE — Chronic Care Management (AMB) (Signed)
    Chronic Care Management Pharmacy Assistant   Name: Brianna Mack  MRN: [id number] DOB: [birth year]  Reason for Encounter: General Assessment Call    Conditions to be addressed/monitored: Patient controlled on conditions*  Recent office visits:  None  Recent consult visits:  None  Hospital visits:  None in previous 6 months  Medications: Outpatient Encounter Medications as of 12/24/2020  Medication Sig   acetaminophen (TYLENOL) 500 MG tablet Take 1 tablet (500 mg total) by mouth every 6 (six) hours as needed for mild pain or moderate pain.   ALPRAZolam (XANAX) 0.25 MG tablet Take 1 tablet (0.25 mg total) by mouth at bedtime as needed for anxiety.   ascorbic acid (VITAMIN C) 100 MG tablet Take 500 mg by mouth 2 (two) times daily.   atorvastatin (LIPITOR) 10 MG tablet TAKE 1 TABLET ONCE DAILY.   Ca Phosphate-Cholecalciferol (CALTRATE GUMMY BITES PO) 630 mg and 1000 units   cetirizine (ZYRTEC) 10 MG tablet Take 10 mg by mouth daily as needed.   citalopram (CELEXA) 20 MG tablet Take 1 tablet (20 mg total) by mouth daily.   clindamycin (CLEOCIN) 150 MG capsule Take 150 mg by mouth 4 (four) times daily. Prior to dental appointments   cyanocobalamin 1000 MCG tablet Take 1,000 mcg by mouth daily.   hyoscyamine (LEVBID) 0.375 MG 12 hr tablet Take 0.375 mg by mouth 2 (two) times daily.   L-Lysine 500 MG TABS Take 1 tablet by mouth daily.   Probiotic Product (ACIDOPHILUS PEARLS PO) Take 1 capsule by mouth daily.   valACYclovir (VALTREX) 500 MG tablet Take 1 tablet (500 mg total) by mouth 2 (two) times daily.   No facility-administered encounter medications on file as of 12/24/2020.  Notes: Call to patient she reports she is doing well, not experiencing any side effects from any medications or any issues. She reports she did some travelling and had some visitors the past few months that she enjoyed. She expressed that she would like to schedule repeat A1C sometime in December. Advised her  I would reach out to the office to advise and someone will reach back out to her to schedule if not myself. She reports the would be fine. She also noted that she has been walking more indoors as the temperatures have decreased a bit.   Care Gaps: Hepatitis C Screening - Overdue Zoster Vaccines - Overdue COVID Booster Therapist, music) - Overdue Flu Vaccine - Overdue CCM - 2/23 AWV- 6/22  Star Rating Drugs: Atorvastatin (Lipitor) 10 mg - Last filled 10/16/20 90 DS at [facility] Pharmacist Assistant [phone]

## 2021-01-01 DIAGNOSIS — L814 Other melanin hyperpigmentation: Secondary | ICD-10-CM | POA: Diagnosis not present

## 2021-01-01 DIAGNOSIS — L918 Other hypertrophic disorders of the skin: Secondary | ICD-10-CM | POA: Diagnosis not present

## 2021-01-01 DIAGNOSIS — Z85828 Personal history of other malignant neoplasm of skin: Secondary | ICD-10-CM | POA: Diagnosis not present

## 2021-01-01 DIAGNOSIS — D1801 Hemangioma of skin and subcutaneous tissue: Secondary | ICD-10-CM | POA: Diagnosis not present

## 2021-01-01 DIAGNOSIS — D225 Melanocytic nevi of trunk: Secondary | ICD-10-CM | POA: Diagnosis not present

## 2021-01-01 DIAGNOSIS — L821 Other seborrheic keratosis: Secondary | ICD-10-CM | POA: Diagnosis not present

## 2021-01-10 DIAGNOSIS — U071 COVID-19: Secondary | ICD-10-CM | POA: Diagnosis not present

## 2021-01-10 DIAGNOSIS — Z9189 Other specified personal risk factors, not elsewhere classified: Secondary | ICD-10-CM | POA: Diagnosis not present

## 2021-01-13 ENCOUNTER — Telehealth: Payer: Self-pay

## 2021-01-13 NOTE — Telephone Encounter (Signed)
Caller states she has covid and wants to know if there's something she can take for it. She has mucinex and tylenol. She has a bad cough. --Caller has a cough, fever, headache, and nausea that started yesterday. Caller has tested positive for Covid 19 yesterday. Caller states no other sx.  01/10/2021 1:01:48 PM Call PCP within 24 Hours Yes Laurann Montana, RN, [place] Disagree/[facility]ply [place] Understands Yes  User: Fransico Meadow, Laurann Montana, RN Date/Time Eilene Ghazi Time): 01/10/2021 1:06:54 PM RN notified caller of on call MD instructions. Caller verbalized understanding  Paging Horace Porteous [phone] 01/10/2021 1:05:04PM Spoke with On Call - On call MD states that patient should be seen at [facility] for evaluation. No further orders.  01/13/21 1330: Pt states she doing better, denies fever, cough is a lot better. Pt states she went to [facility] & received prescription for antiviral - has 2 more days left of it. Pt able to repeat back masking/isolation guidelines. Advised to call office to schedule in person appt if symptoms worsen, develop fever, SOB. Pt verb understanding.

## 2021-01-14 ENCOUNTER — Telehealth: Payer: Self-pay | Admitting: *Deleted

## 2021-01-14 ENCOUNTER — Telehealth: Payer: Self-pay | Admitting: Internal Medicine

## 2021-01-14 NOTE — Telephone Encounter (Signed)
Patient is aware 

## 2021-01-14 NOTE — Telephone Encounter (Signed)
Patient called and was sent to triage because she was concerned because she has been taking her atorvastatin (LIPITOR) 10 MG tablet with the paxlovid for the three days. Patient states she was told that she shouldn't take any medication that wasn't necessary. Patient is now worried about if she is okay taking them together or not. Patient hasn't take the LIPITOR today and the last does of Paxlovid will be Thursday morning. Patient will not take anymore LIPITOR until she hears something back and would like a phone call to discuss.      Good callback number is [phone]    Please advise

## 2021-01-14 NOTE — Telephone Encounter (Signed)
-----   Message from Viona Gilmore, [facility] sent at 01/12/2021  1:12 PM EST ----- Regarding: Repeat A1c Hi,  Ms. Flock requested to have a repeat A1c in December, can you help out with this and either call her or place the order and I will call her back?  Let me know either way!  Thanks, Maddie

## 2021-01-14 NOTE — Telephone Encounter (Signed)
Patient has an appointment 01/22/21.  A1c will be done at that visit.

## 2021-01-14 NOTE — Telephone Encounter (Signed)
---  Caller states has covid; spoke w/on call nurse over the holidays; went to [facility] and got Paxlovid; instructions say not to take other meds unless necessary; has not been taking regular meds; taking Paxlovid, tylenol, and lipitor; wants to be sure ok. Should finish Paxlovid Thursday morning; and then go back to taking regular meds; Dr Jerilee Hoh does not prescribe all meds but office has a list; S/S started 11/23 and then tested+ on 01/09/21. Rx for Paxlovid started late Saturday. No fever, cough is better.  01/14/2021 2:43:10 [place], RN, [place] Understands Yes  Comments User: Manning Charity, RN Date/Time Eilene Ghazi Time): 01/14/2021 2:23:17 PM wants hydrocodone/gaiffenissin cough syrup.  User: Manning Charity, RN Date/Time Eilene Ghazi Time): 01/14/2021 2:44:05 PM Transferred to backline for appt, med question for Dr H, and requests refill of the cough syrup for bedtime.  Referrals Warm transfer to backline

## 2021-01-21 ENCOUNTER — Ambulatory Visit: Payer: Medicare Other | Admitting: Nurse Practitioner

## 2021-01-21 ENCOUNTER — Encounter: Payer: Medicare Other | Admitting: Obstetrics and Gynecology

## 2021-01-22 ENCOUNTER — Ambulatory Visit (INDEPENDENT_AMBULATORY_CARE_PROVIDER_SITE_OTHER): Payer: Medicare Other | Admitting: Internal Medicine

## 2021-01-22 ENCOUNTER — Encounter: Payer: Self-pay | Admitting: Internal Medicine

## 2021-01-22 VITALS — BP 110/70 | HR 80 | Temp 97.7°F | Wt 174.7 lb

## 2021-01-22 DIAGNOSIS — U071 COVID-19: Secondary | ICD-10-CM

## 2021-01-22 NOTE — Progress Notes (Signed)
Established Patient Office Visit     This visit occurred during the SARS-CoV-2 public health emergency.  Safety protocols were in place, including screening questions prior to the visit, additional usage of staff PPE, and extensive cleaning of exam room while observing appropriate contact time as indicated for disinfecting solutions.    CC/Reason for Visit: F/u post COVID  HPI: Brianna Mack is a 75 y.o. female who is coming in today for the above mentioned reasons.  She developed flulike symptoms the weekend before Thanksgiving.  On 11/24 she did a home COVID test and was positive.  The next day she went to urgent care and was prescribed Paxlovid and a cough syrup.  She is doing better and has no acute concerns or complaints.  Past Medical/Surgical History: Past Medical History:  Diagnosis Date   Anxiety    Arthritis    Depression    HSV (herpes simplex virus) anogenital infection 11/2015   IBS (irritable bowel syndrome)    Stress fracture     Past Surgical History:  Procedure Laterality Date   ABDOMINAL HYSTERECTOMY  2001   BSO ; SLING PROCEDURE endometriosis/leiomyomata   CATARACT EXTRACTION, BILATERAL     CHOLECYSTECTOMY  1976   DILATION AND CURETTAGE OF UTERUS     HAND SURGERY  04-02-2011   right   OOPHORECTOMY     BSO   ORIF SHOULDER FRACTURE Right 06/28/2014   Procedure: RIGHT SHOULDER HEMI-ARTHROPLASTY;  Surgeon: Netta Cedars, MD;  Location: [facility];  Service: Orthopedics;  Laterality: Right;   TONSILLECTOMY  1954    Social History:  reports that she quit smoking about 43 years ago. Her smoking use included cigarettes. She has never used smokeless tobacco. She reports current alcohol use. She reports that she does not use drugs.  Allergies: Allergies  Allergen Reactions   Augmentin [Amoxicillin-Pot Clavulanate] Itching and Swelling    Headache   Cephalexin Diarrhea   Codeine Sulfate     REACTION: nausea, vomiting   Fluconazole Itching and Swelling     REACTION: swelling and itching   Morphine     REACTION: nausea, itching   Oxycodone Nausea Only   Sulfonamide Derivatives     REACTION: rash   Amoxicillin Rash   Cyclobenzaprine     Per patient makes her "very groggy"-unlike usual effect   Other Swelling    Pt states shes also allergic to an antibiotic but does not know the name or have her list with her today, this antibiotic causes face swelling.    Family History:  Family History  Problem Relation Age of Onset   Diabetes Mother    Hypertension Mother    Cancer Mother        ANGIOSARCOMA   COPD Father      Current Outpatient Medications:    acetaminophen (TYLENOL) 500 MG tablet, Take 1 tablet (500 mg total) by mouth every 6 (six) hours as needed for mild pain or moderate pain., Disp: 30 tablet, Rfl: 0   ALPRAZolam (XANAX) 0.25 MG tablet, Take 1 tablet (0.25 mg total) by mouth at bedtime as needed for anxiety., Disp: 30 tablet, Rfl: 3   ascorbic acid (VITAMIN C) 100 MG tablet, Take 500 mg by mouth 2 (two) times daily., Disp: , Rfl:    atorvastatin (LIPITOR) 10 MG tablet, TAKE 1 TABLET ONCE DAILY., Disp: 90 tablet, Rfl: 1   Ca Phosphate-Cholecalciferol (CALTRATE GUMMY BITES PO), 630 mg and 1000 units, Disp: , Rfl:    cetirizine (ZYRTEC)  10 MG tablet, Take 10 mg by mouth daily as needed., Disp: , Rfl:    citalopram (CELEXA) 20 MG tablet, Take 1 tablet (20 mg total) by mouth daily., Disp: 90 tablet, Rfl: 4   cyanocobalamin 1000 MCG tablet, Take 1,000 mcg by mouth daily., Disp: , Rfl:    HYDROcodone bit-homatropine (HYCODAN) 5-1.5 MG/5ML syrup, SMARTSIG:1 Teaspoon By Mouth Twice Daily PRN, Disp: , Rfl:    hyoscyamine (LEVBID) 0.375 MG 12 hr tablet, Take 0.375 mg by mouth 2 (two) times daily., Disp: , Rfl:    L-Lysine 500 MG TABS, Take 1 tablet by mouth daily., Disp: , Rfl:    Probiotic Product (ACIDOPHILUS PEARLS PO), Take 1 capsule by mouth daily., Disp: , Rfl:    valACYclovir (VALTREX) 500 MG tablet, Take 1 tablet (500 mg total)  by mouth 2 (two) times daily., Disp: 30 tablet, Rfl: 1  Review of Systems:  Constitutional: Denies fever, chills, diaphoresis, appetite change and fatigue.  HEENT: Denies photophobia, eye pain, redness, hearing loss, ear pain, congestion, sore throat, rhinorrhea, sneezing, mouth sores, trouble swallowing, neck pain, neck stiffness and tinnitus.   Respiratory: Denies SOB, DOE, cough, chest tightness,  and wheezing.   Cardiovascular: Denies chest pain, palpitations and leg swelling.  Gastrointestinal: Denies nausea, vomiting, abdominal pain, diarrhea, constipation, blood in stool and abdominal distention.  Genitourinary: Denies dysuria, urgency, frequency, hematuria, flank pain and difficulty urinating.  Endocrine: Denies: hot or cold intolerance, sweats, changes in hair or nails, polyuria, polydipsia. Musculoskeletal: Denies myalgias, back pain, joint swelling, arthralgias and gait problem.  Skin: Denies pallor, rash and wound.  Neurological: Denies dizziness, seizures, syncope, weakness, light-headedness, numbness and headaches.  Hematological: Denies adenopathy. Easy bruising, personal or family bleeding history  Psychiatric/Behavioral: Denies suicidal ideation, mood changes, confusion, nervousness, sleep disturbance and agitation    Physical Exam: Vitals:   01/22/21 1037  BP: 110/70  Pulse: 80  Temp: 97.7 F (36.5 C)  TempSrc: Oral  SpO2: 100%  Weight: 174 lb 11.2 oz (79.2 kg)    Body mass index is 30.95 kg/m.   Constitutional: NAD, calm, comfortable Eyes: PERRL, lids and conjunctivae normal, wears corrective lenses ENMT: Mucous membranes are moist.  Respiratory: clear to auscultation bilaterally, no wheezing, no crackles. Normal respiratory effort. No accessory muscle use.  Cardiovascular: Regular rate and rhythm, no murmurs / rubs / gallops. No extremity edema. Neurologic: Grossly intact and nonfocal Psychiatric: Normal judgment and insight. Alert and oriented x 3. Normal  mood.    Impression and Plan:  COVID -She has recovered well, has some remaining fatigue which is to be expected. -She will get her flu vaccine next week and her COVID booster in 60 to 90 days.  Time spent: 22 minutes reviewing chart, interviewing and examining patient and formulating plan of care.     Lelon Frohlich, MD [facility] at [facility]

## 2021-01-26 ENCOUNTER — Other Ambulatory Visit: Payer: Self-pay | Admitting: Internal Medicine

## 2021-01-26 DIAGNOSIS — E785 Hyperlipidemia, unspecified: Secondary | ICD-10-CM

## 2021-03-05 ENCOUNTER — Ambulatory Visit: Payer: Medicare Other | Admitting: Nurse Practitioner

## 2021-03-10 ENCOUNTER — Other Ambulatory Visit: Payer: Self-pay

## 2021-03-10 DIAGNOSIS — F341 Dysthymic disorder: Secondary | ICD-10-CM

## 2021-03-10 MED ORDER — CITALOPRAM HYDROBROMIDE 20 MG PO TABS: 20.0000 mg | ORAL_TABLET | Freq: Every day | ORAL | 0 refills | Status: DC

## 2021-03-10 NOTE — Telephone Encounter (Signed)
AEX 01/19/20 with Dr. Delilah Shan. Rx was prescribed at that time.  Patient is scheduled for AEX with T. Juleen China, NP on 04/23/2021.

## 2021-04-04 ENCOUNTER — Telehealth: Payer: Self-pay | Admitting: Internal Medicine

## 2021-04-04 NOTE — Telephone Encounter (Signed)
Pt is calling and would like to have her a1c recheck last a1c was 09-20-2020. Please advise. Pt is sch for cpe in June 2023

## 2021-04-04 NOTE — Telephone Encounter (Signed)
Pt has been sch for 04-09-2021

## 2021-04-07 ENCOUNTER — Telehealth: Payer: Self-pay | Admitting: Pharmacist

## 2021-04-07 NOTE — Chronic Care Management (AMB) (Signed)
° ° °  Chronic Care Management Pharmacy Assistant   Name: Brianna Mack  MRN: [id number] DOB: 17-Sep-1945 04/07/21 APPOINTMENT REMINDER   Called Patient No answer, left message of appointment on 04/09/21 at 10:30 via telephone visit with Jeni Salles, Pharm D.   Notified to have all medications, supplements, blood pressure and/or blood sugar logs available during appointment and to return call if need to reschedule.     Care Gaps: Hepatitis C Screening - Overdue Zoster Vaccine - Overdue COVID Booster - Overdue Flu Vaccine - Overdue Awv- 6/22  Star Rating Drug: Atorvastatin (Lipitor) 10 mg - Last filled 01/27/21 90 DS at [facility]  Any gaps in medications fill history? None  Medications: Outpatient Encounter Medications as of 04/07/2021  Medication Sig   acetaminophen (TYLENOL) 500 MG tablet Take 1 tablet (500 mg total) by mouth every 6 (six) hours as needed for mild pain or moderate pain.   ALPRAZolam (XANAX) 0.25 MG tablet Take 1 tablet (0.25 mg total) by mouth at bedtime as needed for anxiety.   ascorbic acid (VITAMIN C) 100 MG tablet Take 500 mg by mouth 2 (two) times daily.   atorvastatin (LIPITOR) 10 MG tablet TAKE 1 TABLET ONCE DAILY.   Ca Phosphate-Cholecalciferol (CALTRATE GUMMY BITES PO) 630 mg and 1000 units   cetirizine (ZYRTEC) 10 MG tablet Take 10 mg by mouth daily as needed.   citalopram (CELEXA) 20 MG tablet Take 1 tablet (20 mg total) by mouth daily.   cyanocobalamin 1000 MCG tablet Take 1,000 mcg by mouth daily.   HYDROcodone bit-homatropine (HYCODAN) 5-1.5 MG/5ML syrup SMARTSIG:1 Teaspoon By Mouth Twice Daily PRN   hyoscyamine (LEVBID) 0.375 MG 12 hr tablet Take 0.375 mg by mouth 2 (two) times daily.   L-Lysine 500 MG TABS Take 1 tablet by mouth daily.   Probiotic Product (ACIDOPHILUS PEARLS PO) Take 1 capsule by mouth daily.   valACYclovir (VALTREX) 500 MG tablet Take 1 tablet (500 mg total) by mouth 2 (two) times daily.   No facility-administered  encounter medications on file as of 04/07/2021.    [place] Clinical Pharmacist Assistant [phone]

## 2021-04-09 ENCOUNTER — Ambulatory Visit: Payer: Medicare Other | Admitting: Internal Medicine

## 2021-04-09 ENCOUNTER — Ambulatory Visit (INDEPENDENT_AMBULATORY_CARE_PROVIDER_SITE_OTHER): Payer: Medicare Other | Admitting: Pharmacist

## 2021-04-09 DIAGNOSIS — F341 Dysthymic disorder: Secondary | ICD-10-CM

## 2021-04-09 DIAGNOSIS — E785 Hyperlipidemia, unspecified: Secondary | ICD-10-CM

## 2021-04-09 NOTE — Patient Instructions (Signed)
Hi Joyanna,  It was great to catch up with you again! Don't forget to get your shingles shots at the pharmacy and request your bone density scan when you see your OBGYN in March.  Please reach out to me if you have any questions or need anything before our follow up!  Best, Maddie  Jeni Salles, PharmD, [place] Pharmacist [place] [facility]   Visit Information   Goals Addressed   None    Patient Care Plan: CCM Pharmacy Care Plan     Problem Identified: Problem: Hyperlipidemia, GERD, Depression, Anxiety, and Prediabetes, vitamin D deficiency      Long-Range Goal: Patient-Specific Goal   Start Date: 09/30/2020  Expected End Date: 09/30/2021  Recent Progress: On track  Priority: High  Note:   Current Barriers:  Unable to independently monitor therapeutic efficacy  Pharmacist Clinical Goal(s):  Patient will achieve adherence to monitoring guidelines and medication adherence to achieve therapeutic efficacy through collaboration with PharmD and provider.   Interventions: 1:1 collaboration with Isaac Bliss, Rayford Halsted, MD regarding development and update of comprehensive plan of care as evidenced by provider attestation and co-signature Inter-disciplinary care team collaboration (see longitudinal plan of care) Comprehensive medication review performed; medication list updated in electronic medical record  Hyperlipidemia: (LDL goal < 100) -Controlled -Current treatment: Atorvastatin 10 mg daily - Appropriate, Effective, Safe, Accessible -Medications previously tried: none  -Current dietary patterns: not much beef; more chicken and salmon; lots of salads with salmon and vegetables -Current exercise habits: walking dog; plans to go back to the gym -Educated on Cholesterol goals;  Benefits of statin for ASCVD risk reduction; Importance of limiting foods high in cholesterol; Exercise goal of 150 minutes per week; -Counseled on diet and  exercise extensively Recommended to continue current medication  Pre-diabetes (A1c goal <6.5%) -Controlled -Current medications: No medications -Medications previously tried: none  -Current home glucose readings fasting glucose: does not need to check post prandial glucose: does not need to check -Denies hypoglycemic/hyperglycemic symptoms -Current meal patterns:  breakfast: did not discuss  lunch: low carb wraps; veggie pizza every once in a while  dinner: salads with salmon; eats broccoli, asparagus, some roasted potatoes, onions snacks: did not discuss drinks: did not discuss -Current exercise: walking dog -Educated on A1c and blood sugar goals; Exercise goal of 150 minutes per week; Carbohydrate counting and/or plate method -Counseled to check feet daily and get yearly eye exams -Counseled on diet and exercise extensively Provided healthy plate handout and diabetes food hub website.  Depression/Anxiety (Goal: minimize symptoms) -Controlled -Current treatment: Alprazolam 0.25 mg 1 tablet at bedtime as needed - Appropriate, Effective, Safe, Accessible Citalopram 20 mg 1 tablet daily - Appropriate, Effective, Safe, Accessible -Medications previously tried/failed: none -PHQ9: 4 -GAD7: n/a -Educated on Benefits of medication for symptom control Benefits of cognitive-behavioral therapy with or without medication -Recommended to continue current medication Counseled on limiting use of alprazolam due to risks for long term use.  Osteopenia (Goal prevent fractures) -Controlled -Last DEXA Scan: 12/2016   T-Score femoral neck: -0.1  T-Score total hip: 0.1  T-Score lumbar spine: 0.7  T-Score forearm radius: -1.1  10-year probability of major osteoporotic fracture: n/a  10-year probability of hip fracture: n/a -Patient is not a candidate for pharmacologic treatment -Current treatment  Calcium and vitamin D 630 & 500 units twice daily - Appropriate, Effective, Safe,  Accessible Vitamin D3 1000 units daily - Appropriate, Effective, Safe, Accessible -Medications previously tried: vitamin D high dose (completed course)  -  Recommend [phone] units of vitamin D daily. Recommend 1200 mg of calcium daily from dietary and supplemental sources. Recommend weight-bearing and muscle strengthening exercises for building and maintaining bone density. -Counseled on diet and exercise extensively Scheduled lab appointment for repeat vitamin D.  Allergic rhinitis (Goal: minimize symptoms) -Controlled -Current treatment  Cetirizine 10 mg 1 tablet daily seasonally - Appropriate, Effective, Safe, Accessible -Medications previously tried: none  -Recommended to continue current medication  IBS (Goal: minimize symptoms) -Controlled -Current treatment  Hyoscamine 0.125 only as needed (for foods like pizza) - Appropriate, Effective, Safe, Accessible Hyoscamine 0.375 mg twice daily - Appropriate, Effective, Safe, Accessible -Medications previously tried: none  -Recommended to continue current medication   Health Maintenance -Vaccine gaps: shingrix, COVID booster -Current therapy:  Vitamin C 500 mg 1 tablet twice daily Vitamin B12 1000 mcg 1 tablet daily Lysine 500 mg daily (gums) Systane Probiotic as needed Benadryl - once every couple months (itching) -Educated on Cost vs benefit of each product must be carefully weighed by individual consumer -Patient is satisfied with current therapy and denies issues -Recommended to continue current medication  Patient Goals/Self-Care Activities Patient will:  - take medications as prescribed target a minimum of 150 minutes of moderate intensity exercise weekly engage in dietary modifications by increasing vegetable intake and aiming for ideal plate model  Follow Up Plan: Telephone follow up appointment with care management team member scheduled for: 1 year       Patient verbalizes understanding of instructions and care  plan provided today and agrees to view in [place]. Active MyChart status confirmed with patient.   Telephone follow up appointment with pharmacy team member scheduled for: 1 year  Viona Gilmore, [facility]

## 2021-04-09 NOTE — Progress Notes (Signed)
Chronic Care Management Pharmacy Note  04/09/2021 Name:  Brianna Mack MRN:  [id number] DOB:  04/26/1945  Summary: Pt is concerned with having prediabetes LDL at goal < 100  Recommendations/Changes made from today's visit: -Recommend repeat DEXA this year -Updated medication list with correct vitamin D dosing  Plan: Follow up general assessment in 6 months Follow up in 1 year  Subjective: Brianna Mack is an 76 y.o. year old female who is a primary patient of Brianna Mack, Rayford Halsted, MD.  The CCM team was consulted for assistance with disease management and care coordination needs.    Engaged with patient by telephone for follow up visit in response to provider referral for pharmacy case management and/or care coordination services.   Consent to Services:  The patient was given information about Chronic Care Management services, agreed to services, and gave verbal consent prior to initiation of services.  Please see initial visit note for detailed documentation.   Patient Care Team: Brianna Mack, Rayford Halsted, MD as PCP - General (Internal Medicine) Viona Gilmore, [facility] as Pharmacist (Pharmacist)  Recent office visits: 01/22/21 Brianna Mack, Rayford Halsted, MD - Patient presented for COVID positive follow up.   Recent consult visits: 12/12/20 Dorna Leitz (ortho): Patient presented for osteoarthritis follow up.  07-08-2020 Hyman Hopes (Ophthalmology) - Patient presented for Glaucoma Evaluation. No medication changes   06-19-2020 Medoff, Tawni Pummel (Gastroenterology) - Patient presented for lower left quadrant pain. No medication changes available.  Hospital visits: None in previous 6 months   Objective:  Lab Results  Component Value Date   CREATININE 0.71 07/23/2020   BUN 17 07/23/2020   GFR 84.04 07/23/2020   GFRNONAA >60 12/14/2019   GFRAA >60 06/28/2014   NA 142 07/23/2020   K 4.3 07/23/2020   CALCIUM 9.3 07/23/2020   CO2 26 07/23/2020   GLUCOSE  87 07/23/2020    Lab Results  Component Value Date/Time   HGBA1C 5.9 09/20/2020 09:55 AM   HGBA1C 5.5 06/05/2020 10:38 AM   HGBA1C 5.3 01/25/2020 11:41 AM   HGBA1C 5.8 07/18/2019 09:40 AM   GFR 84.04 07/23/2020 09:17 AM   GFR 83.41 07/18/2019 09:40 AM    Last diabetic Eye exam: No results found for: HMDIABEYEEXA  Last diabetic Foot exam: No results found for: HMDIABFOOTEX   Lab Results  Component Value Date   CHOL 160 06/05/2020   HDL 68.30 06/05/2020   LDLCALC 77 06/05/2020   LDLDIRECT 124.9 05/27/2011   TRIG 72.0 06/05/2020   CHOLHDL 2 06/05/2020    Hepatic Function Latest Ref Rng & Units 07/23/2020 07/18/2019 07/06/2017  Total Protein 6.0 - 8.3 g/dL 6.6 6.7 6.8  Albumin 3.5 - 5.2 g/dL 4.1 4.2 4.0  AST 0 - 37 U/L _0 ALT 0 - 35 U/L _1 Alk Phosphatase 39 - 117 U/L 83 79 79  Total Bilirubin 0.2 - 1.2 mg/dL 0.5 0.4 0.6  Bilirubin, Direct 0.0 - 0.3 mg/dL - - -    Lab Results  Component Value Date/Time   TSH 3.96 07/23/2020 09:17 AM   TSH 3.76 07/18/2019 09:40 AM    CBC Latest Ref Rng & Units 07/23/2020 12/14/2019 07/18/2019  WBC 4.0 - 10.5 K/uL 5.8 12.2(H) 5.8  Hemoglobin 12.0 - 15.0 g/dL 13.7 13.9 13.3  Hematocrit 36.0 - 46.0 % 41.4 44.4 40.4  Platelets 150.0 - 400.0 K/uL 305.0 302 283.0    Lab Results  Component Value Date/Time   VD25OH 46.35 10/23/2020  08:51 AM   VD25OH 28.41 (L) 07/23/2020 09:17 AM    Clinical ASCVD: No  The 10-year ASCVD risk score (Arnett DK, et al., 2019) is: 10.6%   Values used to calculate the score:     Age: 46 years     Sex: Female     Is Non-Hispanic African American: No     Diabetic: No     Tobacco smoker: No     Systolic Blood Pressure: 195 mmHg     Is BP treated: No     HDL Cholesterol: 68.3 mg/dL     Total Cholesterol: 160 mg/dL    Depression screen [facility] 2/9 01/22/2021 07/23/2020 12/27/2019  Decreased Interest 0 0 0  Down, Depressed, Hopeless 0 0 0  PHQ - 2 Score 0 0 0  Altered sleeping 0 2 0  Tired, decreased  energy 1 0 0  Change in appetite 0 2 0  Feeling bad or failure about yourself  0 0 0  Trouble concentrating 0 0 0  Moving slowly or fidgety/restless 0 0 0  Suicidal thoughts 0 0 0  PHQ-9 Score 1 4 0  Difficult doing work/chores - Not difficult at all Not difficult at all      Social History   Tobacco Use  Smoking Status Former   Types: Cigarettes   Quit date: 08/03/1977   Years since quitting: 43.7  Smokeless Tobacco Never   BP Readings from Last 3 Encounters:  01/22/21 110/70  07/23/20 130/70  06/05/20 120/74   Pulse Readings from Last 3 Encounters:  01/22/21 80  07/23/20 (!) 55  06/05/20 62   Wt Readings from Last 3 Encounters:  01/22/21 174 lb 11.2 oz (79.2 kg)  07/23/20 178 lb 3.2 oz (80.8 kg)  06/05/20 182 lb 3.2 oz (82.6 kg)   BMI Readings from Last 3 Encounters:  01/22/21 30.95 kg/m  07/23/20 31.57 kg/m  06/05/20 33.32 kg/m    Assessment/Interventions: Review of patient past medical history, allergies, medications, health status, including review of consultants reports, laboratory and other test data, was performed as part of comprehensive evaluation and provision of chronic care management services.   SDOH:  (Social Determinants of Health) assessments and interventions performed: Yes   SDOH Screenings   Alcohol Screen: Not on file  Depression (PHQ2-9): Low Risk    PHQ-2 Score: 1  Financial Resource Strain: Low Risk    Difficulty of Paying Living Expenses: Not hard at all  Food Insecurity: Not on file  Housing: Not on file  Physical Activity: Not on file  Social Connections: Not on file  Stress: Not on file  Tobacco Use: Medium Risk   Smoking Tobacco Use: Former   Smokeless Tobacco Use: Never   Passive Exposure: Not on file  Transportation Needs: No Transportation Needs   Lack of Transportation (Medical): No   Lack of Transportation (Non-Medical): No   Patient gets up around 7-7:30 to let her dog out (daushaund) and sometimes goes back to bed  after letting her out. She then eats breakfast and does house chores. She spends a lot of her time bringing her sister to appointments and getting her groceries and such. Her husband passed aware in October of 2017.  She does enjoy walking depending on the weather and usually goes early in the morning. She is a member of a fitness center but has not started going back. She is planing on going in the morning 2-3 days a week and still has a Higher education careers adviser. This is at [employer] and the membership is free with [employer].  CCM Care Plan  Allergies  Allergen Reactions   Augmentin [Amoxicillin-Pot Clavulanate] Itching and Swelling    Headache   Cephalexin Diarrhea   Codeine Sulfate     REACTION: nausea, vomiting   Fluconazole Itching and Swelling    REACTION: swelling and itching   Morphine     REACTION: nausea, itching   Oxycodone Nausea Only   Sulfonamide Derivatives     REACTION: rash   Amoxicillin Rash   Cyclobenzaprine     Per patient makes her "very groggy"-unlike usual effect   Other Swelling    Pt states shes also allergic to an antibiotic but does not know the name or have her list with her today, this antibiotic causes face swelling.    Medications Reviewed Today     Reviewed by Viona Gilmore, [facility] (Pharmacist) on 04/09/21 at 1100  Med List Status: <None>   Medication Order Taking? Sig Documenting Provider Last Dose Status Informant  acetaminophen (TYLENOL) 500 MG tablet [id number]  Take 1 tablet (500 mg total) by mouth every 6 (six) hours as needed for mild pain or moderate pain. [name], [name]  Active   ALPRAZolam Duanne Moron) 0.25 MG tablet [id number]  Take 1 tablet (0.25 mg total) by mouth at bedtime as needed for anxiety. Joseph Pierini, MD  Active   ascorbic acid (VITAMIN C) 100 MG tablet [id number]  Take 500 mg by mouth 2 (two) times daily. [provider]  Active   atorvastatin (LIPITOR) 10 MG tablet [id number]  TAKE 1 TABLET ONCE DAILY. Brianna Mack,  Rayford Halsted, MD  Active   Ca Phosphate-Cholecalciferol (CALTRATE GUMMY BITES PO) [id number] Yes 630 mg and 1000 units [provider] Taking Active   cetirizine (ZYRTEC) 10 MG tablet [id number]  Take 10 mg by mouth daily as needed. [provider]  Active   cholecalciferol (VITAMIN D3) 25 MCG (1000 UNIT) tablet [id number] Yes Take 1,000 Units by mouth daily. [provider] Taking Active   citalopram (CELEXA) 20 MG tablet [id number]  Take 1 tablet (20 mg total) by mouth daily. Marny Lowenstein A, NP  Active   cyanocobalamin 1000 MCG tablet [id number]  Take 1,000 mcg by mouth daily. [provider]  Active   hyoscyamine (LEVBID) 0.375 MG 12 hr tablet [id number]  Take 0.375 mg by mouth 2 (two) times daily. [provider]  Active Self  L-Lysine 500 MG TABS [id number]  Take 1 tablet by mouth daily. [provider]  Active   Probiotic Product (ACIDOPHILUS PEARLS PO) [id number]  Take 1 capsule by mouth daily. [provider]  Active   valACYclovir (VALTREX) 500 MG tablet [id number]  Take 1 tablet (500 mg total) by mouth 2 (two) times daily.  Patient taking differently: Take 500 mg by mouth 2 (two) times daily as needed.   Joseph Pierini, MD  Active             Patient Active Problem List   Diagnosis Date Noted   Vitamin D deficiency 07/24/2020   Hyperlipidemia 07/18/2019   IGT (impaired glucose tolerance) 07/18/2019   GERD (gastroesophageal reflux disease) 05/04/2019   Pain in left knee 03/17/2018   Irritable bowel syndrome 12/15/2016   Fracture dislocation of shoulder joint 06/27/2014   ANXIETY DEPRESSION 03/26/2009    Immunization History  Administered Date(s) Administered   Fluad Quad(high Dose 65+) 11/29/2018   Influenza Split 12/15/2010, 11/19/2011, 12/22/2016   Influenza Whole 11/18/2007   Influenza, High Dose  Seasonal PF 12/12/2012, 12/01/2013, 01/21/2015, 01/02/2016, 12/10/2017, 11/29/2018   Influenza,inj,Quad PF,6+ Mos  12/22/2016, 12/27/2019   Influenza-Unspecified 11/17/2019   PFIZER(Purple Top)SARS-COV-2 Vaccination 03/11/2019, 04/01/2019, 12/06/2019, 07/25/2020   Pneumococcal Conjugate-13 09/07/2014   Pneumococcal Polysaccharide-23 04/21/2016, 11/18/2018   Tdap 06/27/2014   Patient was really tired for 3 weeks after COVID and didn't feel great while she had COVID but is feeling a lot better now. Patient lost 5 lbs with COVID and didn't eat for almost 4 days. Then when she started eating again and the weight came right back. She hasn't been eating as well lately so she is worried about her  Patient hasn't had a more recent DEXA scan. She follows up with OBGYN in March and will request this then.  Conditions to be addressed/monitored:  Hyperlipidemia, GERD, Depression, Anxiety, and Prediabetes, vitamin D deficiency  Conditions addressed this visit: Pre-diabetes, vitamin D deficiency, hyperlipidemia  Care Plan : CCM Pharmacy Care Plan  Updates made by Viona Gilmore, [place] since 04/09/2021 12:00 AM     Problem: Problem: Hyperlipidemia, GERD, Depression, Anxiety, and Prediabetes, vitamin D deficiency      Long-Range Goal: Patient-Specific Goal   Start Date: 09/30/2020  Expected End Date: 09/30/2021  Recent Progress: On track  Priority: High  Note:   Current Barriers:  Unable to independently monitor therapeutic efficacy  Pharmacist Clinical Goal(s):  Patient will achieve adherence to monitoring guidelines and medication adherence to achieve therapeutic efficacy through collaboration with PharmD and provider.   Interventions: 1:1 collaboration with Brianna Mack, Rayford Halsted, MD regarding development and update of comprehensive plan of care as evidenced by provider attestation and co-signature Inter-disciplinary care team collaboration (see longitudinal plan of care) Comprehensive medication review performed; medication list updated in electronic medical record  Hyperlipidemia: (LDL goal <  100) -Controlled -Current treatment: Atorvastatin 10 mg daily - Appropriate, Effective, Safe, Accessible -Medications previously tried: none  -Current dietary patterns: not much beef; more chicken and salmon; lots of salads with salmon and vegetables -Current exercise habits: walking dog; plans to go back to the gym -Educated on Cholesterol goals;  Benefits of statin for ASCVD risk reduction; Importance of limiting foods high in cholesterol; Exercise goal of 150 minutes per week; -Counseled on diet and exercise extensively Recommended to continue current medication  Pre-diabetes (A1c goal <6.5%) -Controlled -Current medications: No medications -Medications previously tried: none  -Current home glucose readings fasting glucose: does not need to check post prandial glucose: does not need to check -Denies hypoglycemic/hyperglycemic symptoms -Current meal patterns:  breakfast: did not discuss  lunch: low carb wraps; veggie pizza every once in a while  dinner: salads with salmon; eats broccoli, asparagus, some roasted potatoes, onions snacks: did not discuss drinks: did not discuss -Current exercise: walking dog -Educated on A1c and blood sugar goals; Exercise goal of 150 minutes per week; Carbohydrate counting and/or plate method -Counseled to check feet daily and get yearly eye exams -Counseled on diet and exercise extensively Provided healthy plate handout and diabetes food hub website.  Depression/Anxiety (Goal: minimize symptoms) -Controlled -Current treatment: Alprazolam 0.25 mg 1 tablet at bedtime as needed - Appropriate, Effective, Safe, Accessible Citalopram 20 mg 1 tablet daily - Appropriate, Effective, Safe, Accessible -Medications previously tried/failed: none -PHQ9: 4 -GAD7: n/a -Educated on Benefits of medication for symptom control Benefits of cognitive-behavioral therapy with or without medication -Recommended to continue current medication Counseled on  limiting use of alprazolam due to risks for long term use.  Osteopenia (Goal prevent fractures) -Controlled -Last DEXA  Scan: 12/2016   T-Score femoral neck: -0.1  T-Score total hip: 0.1  T-Score lumbar spine: 0.7  T-Score forearm radius: -1.1  10-year probability of major osteoporotic fracture: n/a  10-year probability of hip fracture: n/a -Patient is not a candidate for pharmacologic treatment -Current treatment  Calcium and vitamin D 630 & 500 units twice daily - Appropriate, Effective, Safe, Accessible Vitamin D3 1000 units daily - Appropriate, Effective, Safe, Accessible -Medications previously tried: vitamin D high dose (completed course)  -Recommend [phone] units of vitamin D daily. Recommend 1200 mg of calcium daily from dietary and supplemental sources. Recommend weight-bearing and muscle strengthening exercises for building and maintaining bone density. -Counseled on diet and exercise extensively Scheduled lab appointment for repeat vitamin D.  Allergic rhinitis (Goal: minimize symptoms) -Controlled -Current treatment  Cetirizine 10 mg 1 tablet daily seasonally - Appropriate, Effective, Safe, Accessible -Medications previously tried: none  -Recommended to continue current medication  IBS (Goal: minimize symptoms) -Controlled -Current treatment  Hyoscamine 0.125 only as needed (for foods like pizza) - Appropriate, Effective, Safe, Accessible Hyoscamine 0.375 mg twice daily - Appropriate, Effective, Safe, Accessible -Medications previously tried: none  -Recommended to continue current medication   Health Maintenance -Vaccine gaps: shingrix, COVID booster -Current therapy:  Vitamin C 500 mg 1 tablet twice daily Vitamin B12 1000 mcg 1 tablet daily Lysine 500 mg daily (gums) Systane Probiotic as needed Benadryl - once every couple months (itching) -Educated on Cost vs benefit of each product must be carefully weighed by individual consumer -Patient is satisfied with  current therapy and denies issues -Recommended to continue current medication  Patient Goals/Self-Care Activities Patient will:  - take medications as prescribed target a minimum of 150 minutes of moderate intensity exercise weekly engage in dietary modifications by increasing vegetable intake and aiming for ideal plate model  Follow Up Plan: Telephone follow up appointment with care management team member scheduled for: 1 year       Medication Assistance: None required.  Patient affirms current coverage meets needs.  Compliance/Adherence/Medication fill history: Care Gaps: Hep C screening, shingrix, COVID booster, influenza  Star-Rating Drugs: Atorvastatin (Lipitor) 10 mg - Last filled 01/27/21 90 DS at [facility]  Patient's preferred pharmacy is:  [facility], [place] [address] Phone: [phone] Fax: [phone]   Uses pill box? Yes - split AM/PM; 2 weeks at at time Pt endorses 99% compliance   We discussed: Current pharmacy is preferred with insurance plan and patient is satisfied with pharmacy services Patient decided to: Continue current medication management strategy  Care Plan and Follow Up Patient Decision:  Patient agrees to Care Plan and Follow-up.  Plan: Telephone follow up appointment with care management team member scheduled for:  1 year  Jeni Salles, PharmD, [place] Pharmacist [employer] at [place] [phone]

## 2021-04-15 ENCOUNTER — Ambulatory Visit (INDEPENDENT_AMBULATORY_CARE_PROVIDER_SITE_OTHER): Payer: Medicare Other | Admitting: Internal Medicine

## 2021-04-15 ENCOUNTER — Encounter: Payer: Self-pay | Admitting: Internal Medicine

## 2021-04-15 VITALS — BP 140/80 | HR 69 | Temp 97.9°F | Wt 178.0 lb

## 2021-04-15 DIAGNOSIS — Z23 Encounter for immunization: Secondary | ICD-10-CM

## 2021-04-15 DIAGNOSIS — R7302 Impaired glucose tolerance (oral): Secondary | ICD-10-CM

## 2021-04-15 DIAGNOSIS — E559 Vitamin D deficiency, unspecified: Secondary | ICD-10-CM | POA: Diagnosis not present

## 2021-04-15 DIAGNOSIS — F341 Dysthymic disorder: Secondary | ICD-10-CM | POA: Diagnosis not present

## 2021-04-15 DIAGNOSIS — E785 Hyperlipidemia, unspecified: Secondary | ICD-10-CM

## 2021-04-15 LAB — POCT GLYCOSYLATED HEMOGLOBIN (HGB A1C): Hemoglobin A1C: 5.7 % — AB (ref 4.0–5.6)

## 2021-04-15 MED ORDER — ATORVASTATIN CALCIUM 10 MG PO TABS: 10.0000 mg | ORAL_TABLET | Freq: Every day | ORAL | 1 refills | Status: DC

## 2021-04-15 NOTE — Progress Notes (Signed)
Established Patient Office Visit     This visit occurred during the SARS-CoV-2 public health emergency.  Safety protocols were in place, including screening questions prior to the visit, additional usage of staff PPE, and extensive cleaning of exam room while observing appropriate contact time as indicated for disinfecting solutions.    CC/Reason for Visit: 75-month follow-up chronic medical conditions  HPI: Brianna Mack is a 76 y.o. female who is coming in today for the above mentioned reasons. Past Medical History is significant for: Hyperlipidemia, impaired glucose tolerance, depression, vitamin D deficiency.  She has been doing well and has no acute concerns.  She is here today to check her A1c.  She is requesting a flu vaccine.  She is also due for a COVID booster and her shingles series.   Past Medical/Surgical History: Past Medical History:  Diagnosis Date   Anxiety    Arthritis    Depression    HSV (herpes simplex virus) anogenital infection 11/2015   IBS (irritable bowel syndrome)    Stress fracture     Past Surgical History:  Procedure Laterality Date   ABDOMINAL HYSTERECTOMY  2001   BSO ; SLING PROCEDURE endometriosis/leiomyomata   CATARACT EXTRACTION, BILATERAL     CHOLECYSTECTOMY  1976   DILATION AND CURETTAGE OF UTERUS     HAND SURGERY  04-02-2011   right   OOPHORECTOMY     BSO   ORIF SHOULDER FRACTURE Right 06/28/2014   Procedure: RIGHT SHOULDER HEMI-ARTHROPLASTY;  Surgeon: Netta Cedars, MD;  Location: [facility];  Service: Orthopedics;  Laterality: Right;   TONSILLECTOMY  1954    Social History:  reports that she quit smoking about 43 years ago. Her smoking use included cigarettes. She has never used smokeless tobacco. She reports current alcohol use. She reports that she does not use drugs.  Allergies: Allergies  Allergen Reactions   Augmentin [Amoxicillin-Pot Clavulanate] Itching and Swelling    Headache   Cephalexin Diarrhea   Codeine Sulfate      REACTION: nausea, vomiting   Fluconazole Itching and Swelling    REACTION: swelling and itching   Morphine     REACTION: nausea, itching   Oxycodone Nausea Only   Sulfonamide Derivatives     REACTION: rash   Amoxicillin Rash   Cyclobenzaprine     Per patient makes her "very groggy"-unlike usual effect   Other Swelling    Pt states shes also allergic to an antibiotic but does not know the name or have her list with her today, this antibiotic causes face swelling.    Family History:  Family History  Problem Relation Age of Onset   Diabetes Mother    Hypertension Mother    Cancer Mother        ANGIOSARCOMA   COPD Father      Current Outpatient Medications:    acetaminophen (TYLENOL) 500 MG tablet, Take 1 tablet (500 mg total) by mouth every 6 (six) hours as needed for mild pain or moderate pain., Disp: 30 tablet, Rfl: 0   ALPRAZolam (XANAX) 0.25 MG tablet, Take 1 tablet (0.25 mg total) by mouth at bedtime as needed for anxiety., Disp: 30 tablet, Rfl: 3   ascorbic acid (VITAMIN C) 100 MG tablet, Take 500 mg by mouth 2 (two) times daily., Disp: , Rfl:    Ca Phosphate-Cholecalciferol (CALTRATE GUMMY BITES PO), 630 mg and 1000 units, Disp: , Rfl:    cetirizine (ZYRTEC) 10 MG tablet, Take 10 mg by mouth daily as  needed., Disp: , Rfl:    cholecalciferol (VITAMIN D3) 25 MCG (1000 UNIT) tablet, Take 1,000 Units by mouth daily., Disp: , Rfl:    citalopram (CELEXA) 20 MG tablet, Take 1 tablet (20 mg total) by mouth daily., Disp: 90 tablet, Rfl: 0   hyoscyamine (LEVBID) 0.375 MG 12 hr tablet, Take 0.375 mg by mouth 2 (two) times daily., Disp: , Rfl:    L-Lysine 500 MG TABS, Take 1 tablet by mouth daily., Disp: , Rfl:    Probiotic Product (ACIDOPHILUS PEARLS PO), Take 1 capsule by mouth daily., Disp: , Rfl:    valACYclovir (VALTREX) 500 MG tablet, Take 1 tablet (500 mg total) by mouth 2 (two) times daily. (Patient taking differently: Take 500 mg by mouth 2 (two) times daily as needed.), Disp:  30 tablet, Rfl: 1   atorvastatin (LIPITOR) 10 MG tablet, Take 1 tablet (10 mg total) by mouth daily., Disp: 90 tablet, Rfl: 1   cyanocobalamin 1000 MCG tablet, Take 1,000 mcg by mouth daily. (Patient not taking: Reported on 04/15/2021), Disp: , Rfl:   Review of Systems:  Constitutional: Denies fever, chills, diaphoresis, appetite change and fatigue.  HEENT: Denies photophobia, eye pain, redness, hearing loss, ear pain, congestion, sore throat, rhinorrhea, sneezing, mouth sores, trouble swallowing, neck pain, neck stiffness and tinnitus.   Respiratory: Denies SOB, DOE, cough, chest tightness,  and wheezing.   Cardiovascular: Denies chest pain, palpitations and leg swelling.  Gastrointestinal: Denies nausea, vomiting, abdominal pain, diarrhea, constipation, blood in stool and abdominal distention.  Genitourinary: Denies dysuria, urgency, frequency, hematuria, flank pain and difficulty urinating.  Endocrine: Denies: hot or cold intolerance, sweats, changes in hair or nails, polyuria, polydipsia. Musculoskeletal: Denies myalgias, back pain, joint swelling, arthralgias and gait problem.  Skin: Denies pallor, rash and wound.  Neurological: Denies dizziness, seizures, syncope, weakness, light-headedness, numbness and headaches.  Hematological: Denies adenopathy. Easy bruising, personal or family bleeding history  Psychiatric/Behavioral: Denies suicidal ideation, mood changes, confusion, nervousness, sleep disturbance and agitation    Physical Exam: Vitals:   04/15/21 1107 04/15/21 1229  BP: (!) 150/90 140/80  Pulse: 69   Temp: 97.9 F (36.6 C)   TempSrc: Oral   SpO2: 98%   Weight: 178 lb (80.7 kg)     Body mass index is 31.54 kg/m.   Constitutional: NAD, calm, comfortable Eyes: PERRL, lids and conjunctivae normal, wears corrective lenses ENMT: Mucous membranes are moist. Respiratory: clear to auscultation bilaterally, no wheezing, no crackles. Normal respiratory effort. No accessory  muscle use.  Cardiovascular: Regular rate and rhythm, no murmurs / rubs / gallops. No extremity edema.  Neurologic: Grossly intact and nonfocal Psychiatric: Normal judgment and insight. Alert and oriented x 3. Normal mood.    Impression and Plan:  IGT (impaired glucose tolerance)  - Plan: POCT glycosylated hemoglobin (Hb A1C) -A1c is stable to improved at 5.7.  Hyperlipidemia, unspecified hyperlipidemia type  - Plan: atorvastatin (LIPITOR) 10 MG tablet  Needs flu shot  - Plan: Flu Vaccine QUAD High Dose(Fluad)  Vitamin D deficiency -Normalized after high-dose supplementation, now on daily over-the-counter therapy.  Time spent: 31 minutes reviewing chart, interviewing and examining patient and formulating plan of care.     Lelon Frohlich, MD [facility]

## 2021-04-23 ENCOUNTER — Encounter: Payer: Self-pay | Admitting: Nurse Practitioner

## 2021-04-23 ENCOUNTER — Other Ambulatory Visit: Payer: Self-pay

## 2021-04-23 ENCOUNTER — Ambulatory Visit (INDEPENDENT_AMBULATORY_CARE_PROVIDER_SITE_OTHER): Payer: Medicare Other | Admitting: Nurse Practitioner

## 2021-04-23 VITALS — BP 124/76 | Ht 61.5 in | Wt 179.0 lb

## 2021-04-23 DIAGNOSIS — Z9189 Other specified personal risk factors, not elsewhere classified: Secondary | ICD-10-CM

## 2021-04-23 DIAGNOSIS — Z01419 Encounter for gynecological examination (general) (routine) without abnormal findings: Secondary | ICD-10-CM

## 2021-04-23 DIAGNOSIS — Z780 Asymptomatic menopausal state: Secondary | ICD-10-CM

## 2021-04-23 DIAGNOSIS — B009 Herpesviral infection, unspecified: Secondary | ICD-10-CM

## 2021-04-23 NOTE — Progress Notes (Signed)
? ?  Brianna Mack 1945/03/30 [id number] ? ? ?History:  76 y.o. G0 presents for breast and pelvic exam. Postmenopausal - no HRT, stopped within the last couple of years. S/P 2001 TAH BSO for endometriosis and fibroids. Normal pap history. Anxiety and depression, vitamin D deficiency, pre-diabetes managed by PCP. Sees dermatology.  ? ?Gynecologic History ?No LMP recorded. Patient has had a hysterectomy. ?  ?Contraception: status post hysterectomy ?Sexually active: No ? ?Health Maintenance ?Last Pap: 10/24/2014. Results were: Normal ?Last mammogram: 10/11/2020. Results were: Normal ?Last colonoscopy: 04/20/2017. Results were: Normal, 10-year recall ?Last Dexa: 12/22/2016. Results were: Normal ? ?Past medical history, past surgical history, family history and social history were all reviewed and documented in the EPIC chart. Widowed.  ? ?ROS:  A ROS was performed and pertinent positives and negatives are included. ? ?Exam: ? ?Vitals:  ? 04/23/21 1349  ?BP: 124/76  ?Weight: 179 lb (81.2 kg)  ?Height: 5' 1.5" (1.562 m)  ? ?Body mass index is 33.27 kg/m?. ? ?General appearance:  Normal ?Thyroid:  Symmetrical, normal in size, without palpable masses or nodularity. ?Respiratory ? Auscultation:  Clear without wheezing or rhonchi ?Cardiovascular ? Auscultation:  Regular rate, without rubs, murmurs or gallops ? Edema/varicosities:  Not grossly evident ?Abdominal ? Soft,nontender, without masses, guarding or rebound. ? Liver/spleen:  No organomegaly noted ? Hernia:  None appreciated ? Skin ? Inspection:  Grossly normal. Generalized seborrheic keratosis and cherry angiomas ?Breasts: Examined lying and sitting.  ? Right: Without masses, retractions, nipple discharge or axillary adenopathy. ? ? Left: Without masses, retractions, nipple discharge or axillary adenopathy. ?Genitourinary  ? Inguinal/mons:  Normal without inguinal adenopathy ? External genitalia:  Normal appearing vulva with no masses, tenderness, or  lesions ? BUS/Urethra/Skene's glands:  Normal ? Vagina:  Normal appearing with normal color and discharge, no lesions ? Cervix:  Absent ? Uterus:  Absent ? Adnexa/parametria:   ?  Rt: Normal in size, without masses or tenderness. ?  Lt: Normal in size, without masses or tenderness. ? Anus and perineum: Normal ? Digital rectal exam: Normal sphincter tone without palpated masses or tenderness ? ?Patient informed chaperone available to be present for breast and pelvic exam. Patient has requested no chaperone to be present. Patient has been advised what will be completed during breast and pelvic exam.  ? ?Assessment/Plan:  76 y.o. G0 for breast and pelvic exam.  ? ?Well female exam with routine gynecological exam - Education provided on SBEs, importance of preventative screenings, current guidelines, high calcium diet, regular exercise, and multivitamin daily. Labs with PCP.  ? ?Postmenopausal - No HRT, stopped within the last couple of years. S/P 2001 TAH BSO for endometriosis and fibroids.  ? ?Screening for cervical cancer - Normal Pap history. No longer screening per guidelines.  ? ?Screening for breast cancer - Normal mammogram history.  Continue annual screenings.  Normal breast exam today. ? ?Screening for colon cancer - 2019 colonoscopy. Will repeat at 10-year interval per GI's recommendation.  ? ?Screening for osteoporosis - Normal bone density 12/2016. Will repeat DXA this November. Continue Vitamin D + Calcium supplement. She plans to get back into the gym.  ? ?Return in 2 years for breast and pelvic exam.  ? ? ? ? ?[name] DNP, 2:25 PM 04/23/2021 ? ?

## 2021-05-13 ENCOUNTER — Other Ambulatory Visit: Payer: Self-pay | Admitting: *Deleted

## 2021-05-13 NOTE — Telephone Encounter (Signed)
Medication refill request:Valtrex ?Last AEX:  04-23-21 TW ?Next AEX: not scheduled ?Last MMG (if hormonal medication request): n/a ?Refill authorized: Today, please advise.  ? ?Medication pended for #30, 1RF. Please refill if appropriate.  ? ?

## 2021-05-14 DIAGNOSIS — L820 Inflamed seborrheic keratosis: Secondary | ICD-10-CM | POA: Diagnosis not present

## 2021-05-14 MED ORDER — VALACYCLOVIR HCL 500 MG PO TABS: 500.0000 mg | ORAL_TABLET | Freq: Two times a day (BID) | ORAL | 1 refills | Status: DC | PRN

## 2021-05-22 ENCOUNTER — Other Ambulatory Visit: Payer: Self-pay

## 2021-05-22 NOTE — Telephone Encounter (Signed)
Last AEX 04/23/21.  ? ?

## 2021-05-26 MED ORDER — ALPRAZOLAM 0.25 MG PO TABS: 0.2500 mg | ORAL_TABLET | Freq: Every evening | ORAL | 1 refills | Status: DC | PRN

## 2021-07-02 DIAGNOSIS — Z961 Presence of intraocular lens: Secondary | ICD-10-CM | POA: Diagnosis not present

## 2021-07-02 DIAGNOSIS — H524 Presbyopia: Secondary | ICD-10-CM | POA: Diagnosis not present

## 2021-07-02 DIAGNOSIS — H52203 Unspecified astigmatism, bilateral: Secondary | ICD-10-CM | POA: Diagnosis not present

## 2021-07-02 DIAGNOSIS — H5213 Myopia, bilateral: Secondary | ICD-10-CM | POA: Diagnosis not present

## 2021-07-02 DIAGNOSIS — R7303 Prediabetes: Secondary | ICD-10-CM | POA: Diagnosis not present

## 2021-07-02 LAB — HM DIABETES EYE EXAM

## 2021-07-03 ENCOUNTER — Encounter: Payer: Self-pay | Admitting: Internal Medicine

## 2021-07-09 ENCOUNTER — Other Ambulatory Visit: Payer: Self-pay | Admitting: Nurse Practitioner

## 2021-07-09 DIAGNOSIS — F341 Dysthymic disorder: Secondary | ICD-10-CM

## 2021-07-09 NOTE — Telephone Encounter (Signed)
Last annual exam was 3/23

## 2021-08-05 ENCOUNTER — Encounter: Payer: Medicare Other | Admitting: Internal Medicine

## 2021-08-06 DIAGNOSIS — R1032 Left lower quadrant pain: Secondary | ICD-10-CM | POA: Diagnosis not present

## 2021-08-13 ENCOUNTER — Encounter: Payer: Medicare Other | Admitting: Internal Medicine

## 2021-08-13 ENCOUNTER — Ambulatory Visit: Payer: Medicare Other | Admitting: Internal Medicine

## 2021-08-25 ENCOUNTER — Encounter: Payer: Self-pay | Admitting: Internal Medicine

## 2021-08-25 ENCOUNTER — Other Ambulatory Visit: Payer: Self-pay | Admitting: Internal Medicine

## 2021-08-25 ENCOUNTER — Ambulatory Visit (INDEPENDENT_AMBULATORY_CARE_PROVIDER_SITE_OTHER): Payer: Medicare Other | Admitting: Internal Medicine

## 2021-08-25 VITALS — BP 120/80 | HR 65 | Temp 97.1°F | Ht 62.5 in | Wt 178.5 lb

## 2021-08-25 DIAGNOSIS — E559 Vitamin D deficiency, unspecified: Secondary | ICD-10-CM | POA: Diagnosis not present

## 2021-08-25 DIAGNOSIS — R7302 Impaired glucose tolerance (oral): Secondary | ICD-10-CM

## 2021-08-25 DIAGNOSIS — Z0000 Encounter for general adult medical examination without abnormal findings: Secondary | ICD-10-CM

## 2021-08-25 DIAGNOSIS — K588 Other irritable bowel syndrome: Secondary | ICD-10-CM | POA: Diagnosis not present

## 2021-08-25 DIAGNOSIS — Z780 Asymptomatic menopausal state: Secondary | ICD-10-CM | POA: Diagnosis not present

## 2021-08-25 DIAGNOSIS — E785 Hyperlipidemia, unspecified: Secondary | ICD-10-CM

## 2021-08-25 DIAGNOSIS — Z13820 Encounter for screening for osteoporosis: Secondary | ICD-10-CM | POA: Diagnosis not present

## 2021-08-25 DIAGNOSIS — R7989 Other specified abnormal findings of blood chemistry: Secondary | ICD-10-CM

## 2021-08-25 LAB — HEMOGLOBIN A1C: Hgb A1c MFr Bld: 6.1 % (ref 4.6–6.5)

## 2021-08-25 LAB — CBC WITH DIFFERENTIAL/PLATELET
Basophils Absolute: 0.1 10*3/uL (ref 0.0–0.1)
Basophils Relative: 1.8 % (ref 0.0–3.0)
Eosinophils Absolute: 0.2 10*3/uL (ref 0.0–0.7)
Eosinophils Relative: 3.6 % (ref 0.0–5.0)
HCT: 43.1 % (ref 36.0–46.0)
Hemoglobin: 14 g/dL (ref 12.0–15.0)
Lymphocytes Relative: 28.6 % (ref 12.0–46.0)
Lymphs Abs: 1.8 10*3/uL (ref 0.7–4.0)
MCHC: 32.5 g/dL (ref 30.0–36.0)
MCV: 91.4 fl (ref 78.0–100.0)
Monocytes Absolute: 0.6 10*3/uL (ref 0.1–1.0)
Monocytes Relative: 9.2 % (ref 3.0–12.0)
Neutro Abs: 3.6 10*3/uL (ref 1.4–7.7)
Neutrophils Relative %: 56.8 % (ref 43.0–77.0)
Platelets: 341 10*3/uL (ref 150.0–400.0)
RBC: 4.72 Mil/uL (ref 3.87–5.11)
RDW: 14.8 % (ref 11.5–15.5)
WBC: 6.4 10*3/uL (ref 4.0–10.5)

## 2021-08-25 LAB — COMPREHENSIVE METABOLIC PANEL
ALT: 14 U/L (ref 0–35)
AST: 19 U/L (ref 0–37)
Albumin: 4.3 g/dL (ref 3.5–5.2)
Alkaline Phosphatase: 87 U/L (ref 39–117)
BUN: 19 mg/dL (ref 6–23)
CO2: 28 mEq/L (ref 19–32)
Calcium: 9.5 mg/dL (ref 8.4–10.5)
Chloride: 105 mEq/L (ref 96–112)
Creatinine, Ser: 0.72 mg/dL (ref 0.40–1.20)
GFR: 82.01 mL/min (ref >60.00)
Glucose, Bld: 89 mg/dL (ref 70–99)
Potassium: 4.5 mEq/L (ref 3.5–5.1)
Sodium: 141 mEq/L (ref 135–145)
Total Bilirubin: 0.5 mg/dL (ref 0.2–1.2)
Total Protein: 7.2 g/dL (ref 6.0–8.3)

## 2021-08-25 LAB — VITAMIN B12: Vitamin B-12: 285 pg/mL (ref 211–911)

## 2021-08-25 LAB — LIPID PANEL
Cholesterol: 181 mg/dL (ref 0–200)
HDL: 71.4 mg/dL (ref >39.00)
LDL Cholesterol: 94 mg/dL (ref 0–99)
NonHDL: 109.81
Total CHOL/HDL Ratio: 3
Triglycerides: 80 mg/dL (ref 0.0–149.0)
VLDL: 16 mg/dL (ref 0.0–40.0)

## 2021-08-25 LAB — VITAMIN D 25 HYDROXY (VIT D DEFICIENCY, FRACTURES): VITD: 43.35 ng/mL (ref 30.00–100.00)

## 2021-08-25 LAB — TSH: TSH: 5.84 u[IU]/mL — ABNORMAL HIGH (ref 0.35–5.50)

## 2021-08-25 NOTE — Patient Instructions (Signed)
-  Nice seeing you today!!  -Lab work today; will notify you once results are available.  -Remember your COVID and shingles vaccines.  -Schedule follow up in 6 months.

## 2021-08-25 NOTE — Progress Notes (Signed)
Established Patient Office Visit     CC/Reason for Visit: Annual preventive exam and subsequent Medicare wellness visit  HPI: Brianna Mack is a 76 y.o. female who is coming in today for the above mentioned reasons. Past Medical History is significant for: Hyperlipidemia, impaired glucose tolerance, depression, vitamin D deficiency.  She is feeling well today and has no acute concerns or complaints.  She has routine eye and dental care, no perceived hearing issues.  She is due for a bivalent COVID-vaccine and shingles vaccines.  She is due for a bone density.   Past Medical/Surgical History: Past Medical History:  Diagnosis Date   Anxiety    Arthritis    Depression    HSV (herpes simplex virus) anogenital infection 11/2015   IBS (irritable bowel syndrome)    Pemphigus vulgaris    Stress fracture     Past Surgical History:  Procedure Laterality Date   ABDOMINAL HYSTERECTOMY  2001   BSO ; SLING PROCEDURE endometriosis/leiomyomata   CATARACT EXTRACTION, BILATERAL     CHOLECYSTECTOMY  1976   DILATION AND CURETTAGE OF UTERUS     HAND SURGERY  04-02-2011   right   OOPHORECTOMY     BSO   ORIF SHOULDER FRACTURE Right 06/28/2014   Procedure: RIGHT SHOULDER HEMI-ARTHROPLASTY;  Surgeon: Netta Cedars, MD;  Location: [facility];  Service: Orthopedics;  Laterality: Right;   TONSILLECTOMY  1954    Social History:  reports that she quit smoking about 44 years ago. Her smoking use included cigarettes. She has never used smokeless tobacco. She reports current alcohol use. She reports that she does not use drugs.  Allergies: Allergies  Allergen Reactions   Augmentin [Amoxicillin-Pot Clavulanate] Itching and Swelling    Headache   Cephalexin Diarrhea   Codeine Sulfate     REACTION: nausea, vomiting   Fluconazole Itching and Swelling    REACTION: swelling and itching   Morphine     REACTION: nausea, itching   Oxycodone Nausea Only   Sulfonamide Derivatives     REACTION: rash    Amoxicillin Rash   Cyclobenzaprine     Per patient makes her "very groggy"-unlike usual effect   Other Swelling    Pt states shes also allergic to an antibiotic but does not know the name or have her list with her today, this antibiotic causes face swelling.    Family History:  Family History  Problem Relation Age of Onset   Diabetes Mother    Hypertension Mother    Cancer Mother        ANGIOSARCOMA   COPD Father    Thyroid disease Sister    Scoliosis Sister      Current Outpatient Medications:    acetaminophen (TYLENOL) 500 MG tablet, Take 1 tablet (500 mg total) by mouth every 6 (six) hours as needed for mild pain or moderate pain., Disp: 30 tablet, Rfl: 0   ALPRAZolam (XANAX) 0.25 MG tablet, Take 1 tablet (0.25 mg total) by mouth at bedtime as needed for anxiety., Disp: 30 tablet, Rfl: 1   atorvastatin (LIPITOR) 10 MG tablet, Take 1 tablet (10 mg total) by mouth daily., Disp: 90 tablet, Rfl: 1   Ca Phosphate-Cholecalciferol (CALTRATE GUMMY BITES PO), 630 mg and 1000 units, Disp: , Rfl:    cetirizine (ZYRTEC) 10 MG tablet, Take 10 mg by mouth daily as needed., Disp: , Rfl:    cholecalciferol (VITAMIN D3) 25 MCG (1000 UNIT) tablet, Take 1,000 Units by mouth daily., Disp: , Rfl:  citalopram (CELEXA) 20 MG tablet, TAKE ONE TABLET BY MOUTH EVERY DAY, Disp: 90 tablet, Rfl: 2   cyanocobalamin 1000 MCG tablet, Take 1,000 mcg by mouth daily., Disp: , Rfl:    hyoscyamine (LEVBID) 0.375 MG 12 hr tablet, Take 0.375 mg by mouth 2 (two) times daily., Disp: , Rfl:    Probiotic Product (ACIDOPHILUS PEARLS PO), Take 1 capsule by mouth daily., Disp: , Rfl:    valACYclovir (VALTREX) 500 MG tablet, Take 1 tablet (500 mg total) by mouth 2 (two) times daily as needed., Disp: 30 tablet, Rfl: 1  Review of Systems:  Constitutional: Denies fever, chills, diaphoresis, appetite change and fatigue.  HEENT: Denies photophobia, eye pain, redness, hearing loss, ear pain, congestion, sore throat, rhinorrhea,  sneezing, mouth sores, trouble swallowing, neck pain, neck stiffness and tinnitus.   Respiratory: Denies SOB, DOE, cough, chest tightness,  and wheezing.   Cardiovascular: Denies chest pain, palpitations and leg swelling.  Gastrointestinal: Denies nausea, vomiting, abdominal pain, diarrhea, constipation, blood in stool and abdominal distention.  Genitourinary: Denies dysuria, urgency, frequency, hematuria, flank pain and difficulty urinating.  Endocrine: Denies: hot or cold intolerance, sweats, changes in hair or nails, polyuria, polydipsia. Musculoskeletal: Denies myalgias, back pain, joint swelling, arthralgias and gait problem.  Skin: Denies pallor, rash and wound.  Neurological: Denies dizziness, seizures, syncope, weakness, light-headedness, numbness and headaches.  Hematological: Denies adenopathy. Easy bruising, personal or family bleeding history  Psychiatric/Behavioral: Denies suicidal ideation, mood changes, confusion, nervousness, sleep disturbance and agitation    Physical Exam: Vitals:   08/25/21 0908  BP: 120/80  Pulse: 65  Temp: (!) 97.1 F (36.2 C)  TempSrc: Oral  SpO2: 98%  Weight: 178 lb 8 oz (81 kg)  Height: 5' 2.5" (1.588 m)    Body mass index is 32.13 kg/m.   Constitutional: NAD, calm, comfortable Eyes: PERRL, lids and conjunctivae normal ENMT: Mucous membranes are moist. Posterior pharynx clear of any exudate or lesions. Normal dentition. Tympanic membrane is pearly white, no erythema or bulging. Neck: normal, supple, no masses, no thyromegaly Respiratory: clear to auscultation bilaterally, no wheezing, no crackles. Normal respiratory effort. No accessory muscle use.  Cardiovascular: Regular rate and rhythm, no murmurs / rubs / gallops. No extremity edema. 2+ pedal pulses. No carotid bruits.  Abdomen: no tenderness, no masses palpated. No hepatosplenomegaly. Bowel sounds positive.  Musculoskeletal: no clubbing / cyanosis. No joint deformity upper and lower  extremities. Good ROM, no contractures. Normal muscle tone.  Skin: no rashes, lesions, ulcers. No induration Neurologic: CN 2-12 grossly intact. Sensation intact, DTR normal. Strength 5/5 in all 4.  Psychiatric: Normal judgment and insight. Alert and oriented x 3. Normal mood.    Subsequent Medicare wellness visit   1. Risk factors, based on past  M,S,F -cardiovascular disease risk factors include age, gender, history of hyperlipidemia   2.  Physical activities: Walking, housework   3.  Depression/mood: Stable, not depressed   4.  Hearing: No perceived issues   5.  ADL's: Independent in all ADLs   6.  Fall risk: Low fall risk   7.  Home safety: No problems identified   8.  Height weight, and visual acuity: height and weight as above, vision:  Vision Screening   Right eye Left eye Both eyes  Without correction '20/50 20/30 20/30 '$  With correction        9.  Counseling: Discussed healthy lifestyle changes   10. Lab orders based on risk factors: Laboratory update will be reviewed   11.  Referral : DEXA   12. Care plan: Follow-up with me in 6 months   13. Cognitive assessment: No cognitive impairment   14. Screening: Patient provided with a written and personalized 5-10 year screening schedule in the AVS. yes   15. Provider List Update: PCP only  16. Advance Directives: Full code   17. Opioids: Patient is not on any opioid prescriptions and has no risk factors for a substance use disorder.   [place] Office Visit from 01/22/2021 in [facility] [place]  PHQ-9 Total Score 1          08/17/2019    9:44 AM 12/14/2019   10:21 PM 12/27/2019    3:31 PM 01/22/2021   10:54 AM 01/22/2021   10:57 AM  Fall Risk  Falls in the past year? 0  1 0 0  Was there an injury with Fall?   1 0 0  Fall Risk Category Calculator   2 0 0  Fall Risk Category   Moderate Low Low  Patient Fall Risk Level  Low fall risk        Impression and Plan:  Encounter for  preventive health examination -Recommend routine eye and dental care. -Immunizations: Advised bivalent COVID and shingles vaccines -Healthy lifestyle discussed in detail. -Labs to be updated today. -Colon cancer screening: 04/2017 -Breast cancer screening: 09/2020 -Cervical cancer screening: Declines -Lung cancer screening: Not applicable -Prostate cancer screening: Not applicable -DEXA: Requested  Encounter for osteoporosis screening in asymptomatic postmenopausal patient  - Plan: DG Bone Density  Vitamin D deficiency  - Plan: VITAMIN D 25 Hydroxy (Vit-D Deficiency, Fractures)  Hyperlipidemia, unspecified hyperlipidemia type  - Plan: CBC with Differential/Platelet, Comprehensive metabolic panel, Lipid panel, TSH, Vitamin B12  IGT (impaired glucose tolerance)  - Plan: Hemoglobin A1c  Other irritable bowel syndrome     Patient Instructions  -Nice seeing you today!!  -Lab work today; will notify you once results are available.  -Remember your COVID and shingles vaccines.  -Schedule follow up in 6 months.      Lelon Frohlich, MD [facility]

## 2021-08-26 ENCOUNTER — Encounter: Payer: Self-pay | Admitting: Internal Medicine

## 2021-08-26 ENCOUNTER — Other Ambulatory Visit (INDEPENDENT_AMBULATORY_CARE_PROVIDER_SITE_OTHER): Payer: Medicare Other

## 2021-08-26 ENCOUNTER — Other Ambulatory Visit: Payer: Self-pay | Admitting: Internal Medicine

## 2021-08-26 DIAGNOSIS — E039 Hypothyroidism, unspecified: Secondary | ICD-10-CM

## 2021-08-26 DIAGNOSIS — R7989 Other specified abnormal findings of blood chemistry: Secondary | ICD-10-CM

## 2021-08-26 LAB — T3, FREE: T3, Free: 2.5 pg/mL (ref 2.3–4.2)

## 2021-08-26 LAB — T4, FREE: Free T4: 0.6 ng/dL (ref 0.60–1.60)

## 2021-08-26 MED ORDER — LEVOTHYROXINE SODIUM 25 MCG PO TABS: 25.0000 ug | ORAL_TABLET | Freq: Every day | ORAL | 1 refills | Status: DC

## 2021-08-27 MED ORDER — LEVOTHYROXINE SODIUM 25 MCG PO TABS: 25.0000 ug | ORAL_TABLET | Freq: Every day | ORAL | 1 refills | Status: DC

## 2021-08-27 NOTE — Addendum Note (Signed)
Addended by: Westley Hummer B on: 08/27/2021 10:11 AM   Modules accepted: Orders

## 2021-10-01 ENCOUNTER — Telehealth: Payer: Self-pay | Admitting: Pharmacist

## 2021-10-01 NOTE — Chronic Care Management (AMB) (Signed)
Chronic Care Management Pharmacy Assistant   Name: Brianna Mack  MRN: [id number] DOB: 11-11-1945.   Reason for Encounter: Disease State   Conditions to be addressed/monitored: General Assessment  Recent office visits:  08/25/21 Isaac Bliss, Rayford Halsted, MD - Patient presented for Preventative health exam and other concerns. Stopped Ascorbic Acid. Stopped Lysine.   04/15/21 Isaac Bliss, Rayford Halsted, MD - Patient presented for IGT and other concerns. No medication changes.  Recent consult visits:  08/06/21 Medoff, Tawni Pummel, MD - Patient presented for LLQ Pain. Prescribed Hyoscyamine (LEVBID) 0.375 mg & hyoscyamine (ANASPAZ) 0.125 mg   07/02/21 Hyman Hopes, MD (Ophthalmology) - Patient presented for Diabetic Eye exam. No medication changes.  05/14/21 Rolm Bookbinder (Derm) - Claims encounter for Inflamed Seborrheic Keratosis. No other visit details available.  04/23/21 [name], NP (Gyn) - Patient presented for Well female exam with routine GYN Exam. No medication changes.   Hospital visits:  None in previous 6 months  Medications: Outpatient Encounter Medications as of 10/01/2021  Medication Sig   acetaminophen (TYLENOL) 500 MG tablet Take 1 tablet (500 mg total) by mouth every 6 (six) hours as needed for mild pain or moderate pain.   ALPRAZolam (XANAX) 0.25 MG tablet Take 1 tablet (0.25 mg total) by mouth at bedtime as needed for anxiety.   atorvastatin (LIPITOR) 10 MG tablet Take 1 tablet (10 mg total) by mouth daily.   Ca Phosphate-Cholecalciferol (CALTRATE GUMMY BITES PO) 630 mg and 1000 units   cetirizine (ZYRTEC) 10 MG tablet Take 10 mg by mouth daily as needed.   cholecalciferol (VITAMIN D3) 25 MCG (1000 UNIT) tablet Take 1,000 Units by mouth daily.   citalopram (CELEXA) 20 MG tablet TAKE ONE TABLET BY MOUTH EVERY DAY   cyanocobalamin 1000 MCG tablet Take 1,000 mcg by mouth daily.   hyoscyamine (LEVBID) 0.375 MG 12 hr tablet Take 0.375 mg by mouth 2  (two) times daily.   levothyroxine (SYNTHROID) 25 MCG tablet Take 1 tablet (25 mcg total) by mouth daily.   Probiotic Product (ACIDOPHILUS PEARLS PO) Take 1 capsule by mouth daily.   valACYclovir (VALTREX) 500 MG tablet Take 1 tablet (500 mg total) by mouth 2 (two) times daily as needed.   No facility-administered encounter medications on file as of 10/01/2021.    Contacted Odis Hollingshead for General Review Call  Adherence Review:  Does the Clinical Pharmacist Assistant have access to adherence rates? Yes Adherence rates for STAR metric medications Atorvastatin (Lipitor) 10 mg - Last filled 07/09/21 90 DS at [facility]  Atorvastatin (Lipitor) 10 mg - Last filled 04/15/21 90 DS at [facility]   Does the patient have >5 day gap between last estimated fill dates for any of the above medications or other medication gaps? No  Disease State Questions:  Able to connect with Patient? Yes Did patient have any problems with their health recently? No  Have you had any admissions or emergency room visits or worsening of your condition(s) since last visit? No  Have you had any visits with new specialists or providers since your last visit? No  Have you had any new health care problem(s) since your last visit? No  Have you run out of any of your medications since you last spoke with clinical pharmacist? No  Are there any medications you are not taking as prescribed? No  Are you having any issues or side effects with your medications? No  Do you have any other health concerns  or questions you want to discuss with your Clinical Pharmacist before your next visit? No  Are there any health concerns that you feel we can do a better job addressing? No  Are you having any problems with any of the following since the last visit: (select all that apply)  None  12. Any falls since last visit? No  13. Any increased or uncontrolled pain since last visit? No  Details: 14. Next visit Type: telephone        Visit with: MP/2/24          15. Additional Details? Yes    Patient reports she is dong well for the most part has been focused on her sister lately who recently was hospitalized with pneumonia. She reports she should be coming home on tomorrow and when she is discharged planning to stay with her for a bit. She reports no issues concerns or needs at this time.  Care Gaps: Hepatitis Screening - Overdue Flu Vaccine - Overdue COVID Booster - Postponed CCM-2/24 BP- 120/80 08/25/21 AWV- 08/25/21  Star Rating Drugs: Atorvastatin (Lipitor) 10 mg - Last filled 07/09/21 90 DS at [facility] Pharmacist Assistant [phone]

## 2021-10-15 DIAGNOSIS — L570 Actinic keratosis: Secondary | ICD-10-CM | POA: Diagnosis not present

## 2021-10-15 DIAGNOSIS — D485 Neoplasm of uncertain behavior of skin: Secondary | ICD-10-CM | POA: Diagnosis not present

## 2021-10-16 ENCOUNTER — Other Ambulatory Visit: Payer: Self-pay | Admitting: Internal Medicine

## 2021-10-16 DIAGNOSIS — E785 Hyperlipidemia, unspecified: Secondary | ICD-10-CM

## 2021-11-26 DIAGNOSIS — M25561 Pain in right knee: Secondary | ICD-10-CM | POA: Diagnosis not present

## 2021-11-26 DIAGNOSIS — M25562 Pain in left knee: Secondary | ICD-10-CM | POA: Diagnosis not present

## 2021-12-10 DIAGNOSIS — Z1231 Encounter for screening mammogram for malignant neoplasm of breast: Secondary | ICD-10-CM | POA: Diagnosis not present

## 2021-12-17 ENCOUNTER — Encounter: Payer: Self-pay | Admitting: Internal Medicine

## 2022-01-22 ENCOUNTER — Encounter: Payer: Self-pay | Admitting: Internal Medicine

## 2022-01-28 DIAGNOSIS — M19211 Secondary osteoarthritis, right shoulder: Secondary | ICD-10-CM | POA: Diagnosis not present

## 2022-02-18 ENCOUNTER — Encounter: Payer: Self-pay | Admitting: Family Medicine

## 2022-02-18 ENCOUNTER — Telehealth (INDEPENDENT_AMBULATORY_CARE_PROVIDER_SITE_OTHER): Payer: Medicare Other | Admitting: Family Medicine

## 2022-02-18 VITALS — Temp 100.0°F

## 2022-02-18 DIAGNOSIS — U071 COVID-19: Secondary | ICD-10-CM

## 2022-02-18 MED ORDER — NIRMATRELVIR/RITONAVIR (PAXLOVID)TABLET: 3.0000 | ORAL_TABLET | Freq: Two times a day (BID) | ORAL | 0 refills | Status: AC

## 2022-02-18 MED ORDER — HYDROCODONE BIT-HOMATROP MBR 5-1.5 MG/5ML PO SOLN: 5.0000 mL | ORAL | 0 refills | Status: DC | PRN

## 2022-02-18 NOTE — Progress Notes (Signed)
Subjective:    Patient ID: Brianna Mack, female    DOB: 04/14/1945, 77 y.o.   MRN: [id number]  HPI Virtual Visit via Video Note  I connected with the patient on 02/18/22 at  9:00 AM EST by a video enabled telemedicine application and verified that I am speaking with the correct person using two identifiers.  Location patient: home Location provider:work or home office Persons participating in the virtual visit: patient, provider  I discussed the limitations of evaluation and management by telemedicine and the availability of in person appointments. The patient expressed understanding and agreed to proceed.   HPI: Here for a Covid infection. Four days ago she developed a ST and a cough that produces clear sputum. She had a fever to 100.6 degrees. No headache or body aches. No SOB. Drinking fluids. She tested positive for the Covid virus a few days ago.    ROS: See pertinent positives and negatives per HPI.  Past Medical History:  Diagnosis Date   Anxiety    Arthritis    Depression    HSV (herpes simplex virus) anogenital infection 11/2015   IBS (irritable bowel syndrome)    Pemphigus vulgaris    Stress fracture     Past Surgical History:  Procedure Laterality Date   ABDOMINAL HYSTERECTOMY  2001   BSO ; SLING PROCEDURE endometriosis/leiomyomata   CATARACT EXTRACTION, BILATERAL     CHOLECYSTECTOMY  1976   DILATION AND CURETTAGE OF UTERUS     HAND SURGERY  04-02-2011   right   OOPHORECTOMY     BSO   ORIF SHOULDER FRACTURE Right 06/28/2014   Procedure: RIGHT SHOULDER HEMI-ARTHROPLASTY;  Surgeon: Netta Cedars, MD;  Location: [facility];  Service: Orthopedics;  Laterality: Right;   TONSILLECTOMY  1954    Family History  Problem Relation Age of Onset   Diabetes Mother    Hypertension Mother    Cancer Mother        ANGIOSARCOMA   COPD Father    Thyroid disease Sister    Scoliosis Sister      Current Outpatient Medications:    acetaminophen (TYLENOL) 500 MG tablet,  Take 1 tablet (500 mg total) by mouth every 6 (six) hours as needed for mild pain or moderate pain., Disp: 30 tablet, Rfl: 0   ALPRAZolam (XANAX) 0.25 MG tablet, Take 1 tablet (0.25 mg total) by mouth at bedtime as needed for anxiety., Disp: 30 tablet, Rfl: 1   atorvastatin (LIPITOR) 10 MG tablet, Take 1 tablet (10 mg total) by mouth daily., Disp: 90 tablet, Rfl: 1   Ca Phosphate-Cholecalciferol (CALTRATE GUMMY BITES PO), 630 mg and 1000 units, Disp: , Rfl:    cetirizine (ZYRTEC) 10 MG tablet, Take 10 mg by mouth daily as needed., Disp: , Rfl:    cholecalciferol (VITAMIN D3) 25 MCG (1000 UNIT) tablet, Take 1,000 Units by mouth daily., Disp: , Rfl:    citalopram (CELEXA) 20 MG tablet, TAKE ONE TABLET BY MOUTH EVERY DAY, Disp: 90 tablet, Rfl: 2   cyanocobalamin 1000 MCG tablet, Take 1,000 mcg by mouth daily., Disp: , Rfl:    HYDROcodone bit-homatropine (HYCODAN) 5-1.5 MG/5ML syrup, Take 5 mLs by mouth every 4 (four) hours as needed for cough., Disp: 240 mL, Rfl: 0   hyoscyamine (LEVBID) 0.375 MG 12 hr tablet, Take 0.375 mg by mouth 2 (two) times daily., Disp: , Rfl:    levothyroxine (SYNTHROID) 25 MCG tablet, Take 1 tablet (25 mcg total) by mouth daily., Disp: 90 tablet, Rfl: 1  nirmatrelvir/ritonavir (PAXLOVID) 20 x 150 MG & 10 x '100MG'$  TABS, Take 3 tablets by mouth 2 (two) times daily for 5 days. (Take nirmatrelvir 150 mg two tablets twice daily for 5 days and ritonavir 100 mg one tablet twice daily for 5 days) Patient GFR is 82, Disp: 30 tablet, Rfl: 0   Probiotic Product (ACIDOPHILUS PEARLS PO), Take 1 capsule by mouth daily., Disp: , Rfl:    valACYclovir (VALTREX) 500 MG tablet, Take 1 tablet (500 mg total) by mouth 2 (two) times daily as needed., Disp: 30 tablet, Rfl: 1  EXAM:  VITALS per patient if applicable:  GENERAL: alert, oriented, appears well and in no acute distress  HEENT: atraumatic, conjunttiva clear, no obvious abnormalities on inspection of external nose and ears  NECK:  normal movements of the head and neck  LUNGS: on inspection no signs of respiratory distress, breathing rate appears normal, no obvious gross SOB, gasping or wheezing  CV: no obvious cyanosis  MS: moves all visible extremities without noticeable abnormality  PSYCH/NEURO: pleasant and cooperative, no obvious depression or anxiety, speech and thought processing grossly intact  ASSESSMENT AND PLAN: Covid infection. Treat with 5 days of Paxlovid. Use Hycodan as needed.  Alysia Penna, MD  Discussed the following assessment and plan:  No diagnosis found.     I discussed the assessment and treatment plan with the patient. The patient was provided an opportunity to ask questions and all were answered. The patient agreed with the plan and demonstrated an understanding of the instructions.   The patient was advised to call back or seek an in-person evaluation if the symptoms worsen or if the condition fails to improve as anticipated.      Review of Systems     Objective:   Physical Exam        Assessment & Plan:

## 2022-02-27 DIAGNOSIS — M2042 Other hammer toe(s) (acquired), left foot: Secondary | ICD-10-CM | POA: Diagnosis not present

## 2022-03-09 ENCOUNTER — Other Ambulatory Visit: Payer: Self-pay | Admitting: Nurse Practitioner

## 2022-03-10 NOTE — Telephone Encounter (Signed)
Last AEX 04/23/2021.

## 2022-03-31 NOTE — Progress Notes (Unsigned)
Care Management & Coordination Services Pharmacy Note  03/31/2022 Name:  Brianna Mack MRN:  [id number] DOB:  01-12-1946  Summary: ***  Recommendations/Changes made from today's visit: ***  Follow up plan: ***   Subjective: Brianna Mack is an 77 y.o. year old female who is a primary patient of Isaac Bliss, Rayford Halsted, MD.  The care coordination team was consulted for assistance with disease management and care coordination needs.    {CCMTELEPHONEFACETOFACE:21091510} for follow up visit.  Recent office visits: 02/18/22 Laurey Morale, MD - For COVID, video visit. START Paxlovid x 5 days  Recent consult visits: 11/26/21 Gerrit Halls, [name] [facility]) For knee pain of both joints, no other visit details available  Hospital visits: None in previous 6 months   Objective:  Lab Results  Component Value Date   CREATININE 0.72 08/25/2021   BUN 19 08/25/2021   GFR 82.01 08/25/2021   GFRNONAA >60 12/14/2019   GFRAA >60 06/28/2014   NA 141 08/25/2021   K 4.5 08/25/2021   CALCIUM 9.5 08/25/2021   CO2 28 08/25/2021   GLUCOSE 89 08/25/2021    Lab Results  Component Value Date/Time   HGBA1C 6.1 08/25/2021 09:47 AM   HGBA1C 5.7 (A) 04/15/2021 11:08 AM   HGBA1C 5.9 09/20/2020 09:55 AM   GFR 82.01 08/25/2021 09:47 AM   GFR 84.04 07/23/2020 09:17 AM    Last diabetic Eye exam:  Lab Results  Component Value Date/Time   HMDIABEYEEXA No Retinopathy 07/02/2021 12:00 AM    Last diabetic Foot exam: No results found for: "HMDIABFOOTEX"   Lab Results  Component Value Date   CHOL 181 08/25/2021   HDL 71.40 08/25/2021   LDLCALC 94 08/25/2021   LDLDIRECT 124.9 05/27/2011   TRIG 80.0 08/25/2021   CHOLHDL 3 08/25/2021       Latest Ref Rng & Units 08/25/2021    9:47 AM 07/23/2020    9:17 AM 07/18/2019    9:40 AM  Hepatic Function  Total Protein 6.0 - 8.3 g/dL 7.2  6.6  6.7   Albumin 3.5 - 5.2 g/dL 4.3  4.1  4.2   AST 0 - 37 U/L 19  15  16   $ ALT 0 - 35 U/L 14  12  12    $ Alk Phosphatase 39 - 117 U/L 87  83  79   Total Bilirubin 0.2 - 1.2 mg/dL 0.5  0.5  0.4     Lab Results  Component Value Date/Time   TSH 5.84 (H) 08/25/2021 09:47 AM   TSH 3.96 07/23/2020 09:17 AM   FREET4 0.60 08/26/2021 12:10 PM       Latest Ref Rng & Units 08/25/2021    9:47 AM 07/23/2020    9:17 AM 12/14/2019   10:40 PM  CBC  WBC 4.0 - 10.5 K/uL 6.4  5.8  12.2   Hemoglobin 12.0 - 15.0 g/dL 14.0  13.7  13.9   Hematocrit 36.0 - 46.0 % 43.1  41.4  44.4   Platelets 150.0 - 400.0 K/uL 341.0  305.0  302     Lab Results  Component Value Date/Time   VD25OH 43.35 08/25/2021 09:47 AM   VD25OH 46.35 10/23/2020 08:51 AM   VITAMINB12 285 08/25/2021 09:47 AM   VITAMINB12 392 07/23/2020 09:17 AM    Clinical ASCVD: No  The 10-year ASCVD risk score (Arnett DK, et al., 2019) is: 14.2%   Values used to calculate the score:     Age: 81 years     Sex:  Female     Is Non-Hispanic African American: No     Diabetic: No     Tobacco smoker: No     Systolic Blood Pressure: 123456 mmHg     Is BP treated: No     HDL Cholesterol: 71.4 mg/dL     Total Cholesterol: 181 mg/dL    DEXA 12/22/16 - Normal, recommend repeat in 5 years. PAST DUE     01/22/2021   10:54 AM 07/23/2020    8:34 AM 12/27/2019    3:31 PM  Depression screen PHQ 2/9  Decreased Interest 0 0 0  Down, Depressed, Hopeless 0 0 0  PHQ - 2 Score 0 0 0  Altered sleeping 0 2 0  Tired, decreased energy 1 0 0  Change in appetite 0 2 0  Feeling bad or failure about yourself  0 0 0  Trouble concentrating 0 0 0  Moving slowly or fidgety/restless 0 0 0  Suicidal thoughts 0 0 0  PHQ-9 Score 1 4 0  Difficult doing work/chores  Not difficult at all Not difficult at all     Social History   Tobacco Use  Smoking Status Former   Types: Cigarettes   Quit date: 08/03/1977   Years since quitting: 44.6  Smokeless Tobacco Never   BP Readings from Last 3 Encounters:  08/25/21 120/80  04/23/21 124/76  04/15/21 140/80   Pulse Readings  from Last 3 Encounters:  08/25/21 65  04/15/21 69  01/22/21 80   Wt Readings from Last 3 Encounters:  08/25/21 178 lb 8 oz (81 kg)  04/23/21 179 lb (81.2 kg)  04/15/21 178 lb (80.7 kg)   BMI Readings from Last 3 Encounters:  08/25/21 32.13 kg/m  04/23/21 33.27 kg/m  04/15/21 31.54 kg/m    Allergies  Allergen Reactions   Augmentin [Amoxicillin-Pot Clavulanate] Itching and Swelling    Headache   Cephalexin Diarrhea   Codeine Sulfate     REACTION: nausea, vomiting   Fluconazole Itching and Swelling    REACTION: swelling and itching   Morphine     REACTION: nausea, itching   Oxycodone Nausea Only   Sulfonamide Derivatives     REACTION: rash   Amoxicillin Rash   Cyclobenzaprine     Per patient makes her "very groggy"-unlike usual effect   Other Swelling    Pt states shes also allergic to an antibiotic but does not know the name or have her list with her today, this antibiotic causes face swelling.    Medications Reviewed Today     Reviewed by Laurey Morale, MD (Physician) on 02/18/22 at [place] List Status: <None>   Medication Order Taking? Sig Documenting Provider Last Dose Status Informant  acetaminophen (TYLENOL) 500 MG tablet [id number] Yes Take 1 tablet (500 mg total) by mouth every 6 (six) hours as needed for mild pain or moderate pain. [name], [name] Taking Active   ALPRAZolam Duanne Moron) 0.25 MG tablet [id number] Yes Take 1 tablet (0.25 mg total) by mouth at bedtime as needed for anxiety. [name], NP Taking Active   atorvastatin (LIPITOR) 10 MG tablet [id number] Yes Take 1 tablet (10 mg total) by mouth daily. Isaac Bliss, Rayford Halsted, MD Taking Active   Ca Phosphate-Cholecalciferol (CALTRATE GUMMY BITES PO) [id number] Yes 630 mg and 1000 units [provider] Taking Active   cetirizine (ZYRTEC) 10 MG tablet [id number] Yes Take 10 mg by mouth daily as needed. [provider] Taking Active   cholecalciferol (VITAMIN D3) 25  MCG (1000  UNIT) tablet [id number] Yes Take 1,000 Units by mouth daily. [provider] Taking Active   citalopram (CELEXA) 20 MG tablet [id number] Yes TAKE ONE TABLET BY MOUTH EVERY DAY Marny Lowenstein A, NP Taking Active   cyanocobalamin 1000 MCG tablet [id number] Yes Take 1,000 mcg by mouth daily. [provider] Taking Active   hyoscyamine (LEVBID) 0.375 MG 12 hr tablet [id number] Yes Take 0.375 mg by mouth 2 (two) times daily. [provider] Taking Active Self  levothyroxine (SYNTHROID) 25 MCG tablet [id number] Yes Take 1 tablet (25 mcg total) by mouth daily. Isaac Bliss, Rayford Halsted, MD Taking Active   Probiotic Product (ACIDOPHILUS PEARLS PO) [id number] Yes Take 1 capsule by mouth daily. [provider] Taking Active   valACYclovir (VALTREX) 500 MG tablet [id number] Yes Take 1 tablet (500 mg total) by mouth 2 (two) times daily as needed. [name], NP Taking Active             SDOH:  (Social Determinants of Health) assessments and interventions performed: Yes SDOH Interventions    Flowsheet Row Chronic Care Management from 09/30/2020 in [place] at [place] Interventions   Transportation Interventions Intervention Not Indicated  Financial Strain Interventions Intervention Not Indicated       Medication Assistance: None required.  Patient affirms current coverage meets needs.  Medication Access: Within the past 30 days, how often has patient missed a dose of medication? *** Is a pillbox or other method used to improve adherence? {YES/NO:21197} Factors that may affect medication adherence? {CHL DESC; BARRIERS:21522} Are meds synced by current pharmacy? {YES/NO:21197} Are meds delivered by current pharmacy? {YES/NO:21197} Does patient experience delays in picking up medications due to transportation concerns? {YES/NO:21197}  Upstream Services Reviewed: Is patient disadvantaged to use [facility]?: No  Current Rx  insurance plan: [facility] Name and location of Current pharmacy:  [place], [address] [place] [address] Phone: [phone] Fax: [phone]  [facility] services reviewed with patient today?: {YES/NO:21197} Patient requests to transfer care to [facility]?: {YES/NO:21197} Reason patient declined to change pharmacies: {US patient preference:27474}  Compliance/Adherence/Medication fill history: Care Gaps: Vaccine: Flu/COVID  Star-Rating Drugs: Atorvastatin 93m PDC 96%   Assessment/Plan   Anxiety (Goal: Lessen anxiety to a level that allows for regular daily life) -{US controlled/uncontrolled:25276} -Current treatment: Xanax 0.277m1qhs prn Citalopram 2069m qd -Medications previously tried/failed: None -PHQ9: *** -GAD7: *** -Educated on {CCM mental health counseling:25127} -{CCMPHARMDINTERVENTION:25122}  Hypothyroidism (Goal: TSH WNL) -{US controlled/uncontrolled:25276} -Current treatment  Levothyroxine 31m107mqd -Medications previously tried: None  -{CCMPHARMDINTERVENTION:25122}  AngeMaren Reamernical Pharmacist [phone]

## 2022-04-07 ENCOUNTER — Telehealth: Payer: Self-pay

## 2022-04-07 NOTE — Progress Notes (Signed)
Patient ID: Brianna Mack, female   DOB: Dec 23, 1945, 77 y.o.   MRN: [id number]  Care Management & Coordination Services Pharmacy Team  Reason for Encounter: Appointment Reminder  Contacted patient to confirm telephone appointment with Theo Dills, PharmD , patient r/s to 04/10/22 @ 11 amSpoke with patient on 04/07/2022     Star Rating Drugs:  Atorvastatin (Lipitor) 10 mg - Last filled 01/17/22 90 DS at [facility]: AWV- 08/25/21 Hepatitis Screen - Overdue Flu Vaccine - Overdue COVID Booster - [place] Clinical Pharmacist Assistant [phone]

## 2022-04-08 NOTE — Progress Notes (Signed)
Care Management & Coordination Services Pharmacy Note  04/10/2022 Name:  Brianna Mack MRN:  [id number] DOB:  [birth year]  Summary: -Pt reports well-controlled anxiety and proper levothyroxine administration -Reminded of need for dexa scan, deferred to sch to sometime next month  Recommendations/Changes made from today's visit: -Continue medication therapy -Reminded pt dexa scan is past due, she deferred to sch sometime next month  Follow up plan: General call in 1 month to sch DEXA scan Pharmacist visit in 6 months   Subjective: Brianna Mack is an 77 y.o. year old female who is a primary patient of Isaac Bliss, Rayford Halsted, MD.  The care coordination team was consulted for assistance with disease management and care coordination needs.    Engaged with patient by telephone for follow up visit.  Recent office visits: 02/18/22 Laurey Morale, MD - For COVID, video visit. START Paxlovid x 5 days  Recent consult visits: 11/26/21 Gerrit Halls, [name] [facility]) For knee pain of both joints, no other visit details available  Hospital visits: None in previous 6 months   Objective:  Lab Results  Component Value Date   CREATININE 0.72 08/25/2021   BUN 19 08/25/2021   GFR 82.01 08/25/2021   GFRNONAA >60 12/14/2019   GFRAA >60 06/28/2014   NA 141 08/25/2021   K 4.5 08/25/2021   CALCIUM 9.5 08/25/2021   CO2 28 08/25/2021   GLUCOSE 89 08/25/2021    Lab Results  Component Value Date/Time   HGBA1C 6.1 08/25/2021 09:47 AM   HGBA1C 5.7 (A) 04/15/2021 11:08 AM   HGBA1C 5.9 09/20/2020 09:55 AM   GFR 82.01 08/25/2021 09:47 AM   GFR 84.04 07/23/2020 09:17 AM    Last diabetic Eye exam:  Lab Results  Component Value Date/Time   HMDIABEYEEXA No Retinopathy 07/02/2021 12:00 AM    Last diabetic Foot exam: No results found for: "HMDIABFOOTEX"   Lab Results  Component Value Date   CHOL 181 08/25/2021   HDL 71.40 08/25/2021   LDLCALC 94 08/25/2021   LDLDIRECT 124.9  05/27/2011   TRIG 80.0 08/25/2021   CHOLHDL 3 08/25/2021       Latest Ref Rng & Units 08/25/2021    9:47 AM 07/23/2020    9:17 AM 07/18/2019    9:40 AM  Hepatic Function  Total Protein 6.0 - 8.3 g/dL 7.2  6.6  6.7   Albumin 3.5 - 5.2 g/dL 4.3  4.1  4.2   AST 0 - 37 U/L '19  15  16   '$ ALT 0 - 35 U/L '14  12  12   '$ Alk Phosphatase 39 - 117 U/L 87  83  79   Total Bilirubin 0.2 - 1.2 mg/dL 0.5  0.5  0.4     Lab Results  Component Value Date/Time   TSH 5.84 (H) 08/25/2021 09:47 AM   TSH 3.96 07/23/2020 09:17 AM   FREET4 0.60 08/26/2021 12:10 PM       Latest Ref Rng & Units 08/25/2021    9:47 AM 07/23/2020    9:17 AM 12/14/2019   10:40 PM  CBC  WBC 4.0 - 10.5 K/uL 6.4  5.8  12.2   Hemoglobin 12.0 - 15.0 g/dL 14.0  13.7  13.9   Hematocrit 36.0 - 46.0 % 43.1  41.4  44.4   Platelets 150.0 - 400.0 K/uL 341.0  305.0  302     Lab Results  Component Value Date/Time   VD25OH 43.35 08/25/2021 09:47 AM   VD25OH 46.35 10/23/2020 08:51 AM  VITAMINB12 285 08/25/2021 09:47 AM   VITAMINB12 392 07/23/2020 09:17 AM    Clinical ASCVD: No  The 10-year ASCVD risk score (Arnett DK, et al., 2019) is: 14.2%   Values used to calculate the score:     Age: 59 years     Sex: Female     Is Non-Hispanic African American: No     Diabetic: No     Tobacco smoker: No     Systolic Blood Pressure: 123456 mmHg     Is BP treated: No     HDL Cholesterol: 71.4 mg/dL     Total Cholesterol: 181 mg/dL    DEXA 12/22/16 - Normal, recommend repeat in 5 years. PAST DUE     01/22/2021   10:54 AM 07/23/2020    8:34 AM 12/27/2019    3:31 PM  Depression screen PHQ 2/9  Decreased Interest 0 0 0  Down, Depressed, Hopeless 0 0 0  PHQ - 2 Score 0 0 0  Altered sleeping 0 2 0  Tired, decreased energy 1 0 0  Change in appetite 0 2 0  Feeling bad or failure about yourself  0 0 0  Trouble concentrating 0 0 0  Moving slowly or fidgety/restless 0 0 0  Suicidal thoughts 0 0 0  PHQ-9 Score 1 4 0  Difficult doing work/chores   Not difficult at all Not difficult at all     Social History   Tobacco Use  Smoking Status Former   Types: Cigarettes   Quit date: 08/03/1977   Years since quitting: 44.7  Smokeless Tobacco Never   BP Readings from Last 3 Encounters:  08/25/21 120/80  04/23/21 124/76  04/15/21 140/80   Pulse Readings from Last 3 Encounters:  08/25/21 65  04/15/21 69  01/22/21 80   Wt Readings from Last 3 Encounters:  08/25/21 178 lb 8 oz (81 kg)  04/23/21 179 lb (81.2 kg)  04/15/21 178 lb (80.7 kg)   BMI Readings from Last 3 Encounters:  08/25/21 32.13 kg/m  04/23/21 33.27 kg/m  04/15/21 31.54 kg/m    Allergies  Allergen Reactions   Augmentin [Amoxicillin-Pot Clavulanate] Itching and Swelling    Headache   Cephalexin Diarrhea   Codeine Sulfate     REACTION: nausea, vomiting   Fluconazole Itching and Swelling    REACTION: swelling and itching   Morphine     REACTION: nausea, itching   Oxycodone Nausea Only   Sulfonamide Derivatives     REACTION: rash   Amoxicillin Rash   Cyclobenzaprine     Per patient makes her "very groggy"-unlike usual effect   Other Swelling    Pt states shes also allergic to an antibiotic but does not know the name or have her list with her today, this antibiotic causes face swelling.    Medications Reviewed Today     Reviewed by Maren Reamer, RPH (Pharmacist) on 04/10/22 at 75  Med List Status: <None>   Medication Order Taking? Sig Documenting Provider Last Dose Status Informant  acetaminophen (TYLENOL) 500 MG tablet [id number] No Take 1 tablet (500 mg total) by mouth every 6 (six) hours as needed for mild pain or moderate pain. [name], [name] Taking Active   ALPRAZolam Duanne Moron) 0.25 MG tablet [id number]  TAKE ONE TABLET BY MOUTH AT BEDTIME AS NEEDED FOR ANXIETY Marny Lowenstein A, NP  Active   atorvastatin (LIPITOR) 10 MG tablet [id number] No Take 1 tablet (10 mg total) by mouth daily. Isaac Bliss, Rayford Halsted, MD Taking Active  Ca  Phosphate-Cholecalciferol (CALTRATE GUMMY BITES PO) [id number] No 630 mg and 1000 units [provider] Taking Active   cetirizine (ZYRTEC) 10 MG tablet [id number] No Take 10 mg by mouth daily as needed. [provider] Taking Active   cholecalciferol (VITAMIN D3) 25 MCG (1000 UNIT) tablet [id number] No Take 1,000 Units by mouth daily. [provider] Taking Active   citalopram (CELEXA) 20 MG tablet [id number] No TAKE ONE TABLET BY MOUTH EVERY DAY Marny Lowenstein A, NP Taking Active   cyanocobalamin 1000 MCG tablet [id number] No Take 1,000 mcg by mouth daily. [provider] Taking Active   HYDROcodone bit-homatropine (HYCODAN) 5-1.5 MG/5ML syrup [id number]  Take 5 mLs by mouth every 4 (four) hours as needed for cough. Laurey Morale, MD  Active   hyoscyamine (LEVBID) 0.375 MG 12 hr tablet [id number] No Take 0.375 mg by mouth 2 (two) times daily. [provider] Taking Active Self  levothyroxine (SYNTHROID) 25 MCG tablet [id number] No Take 1 tablet (25 mcg total) by mouth daily. Isaac Bliss, Rayford Halsted, MD Taking Active   Probiotic Product (ACIDOPHILUS PEARLS PO) [id number] No Take 1 capsule by mouth daily. [provider] Taking Active   valACYclovir (VALTREX) 500 MG tablet [id number] No Take 1 tablet (500 mg total) by mouth 2 (two) times daily as needed. [name], NP Taking Active             SDOH:  (Social Determinants of Health) assessments and interventions performed: Yes SDOH Interventions    Flowsheet Row Care Coordination from 04/10/2022 in [place] Management from 09/30/2020 in [place] at [place] Interventions Intervention Not Indicated --  Housing Interventions Intervention Not Indicated --  Transportation Interventions Intervention Not Indicated Intervention Not Indicated  Utilities Interventions Intervention Not Indicated --   Financial Strain Interventions Intervention Not Indicated Intervention Not Indicated       Medication Assistance: None required.  Patient affirms current coverage meets needs.  Medication Access: Within the past 30 days, how often has patient missed a dose of medication? None Is a pillbox or other method used to improve adherence? Yes  Factors that may affect medication adherence? no barriers identified Are meds synced by current pharmacy? No  Are meds delivered by current pharmacy? No  Does patient experience delays in picking up medications due to transportation concerns? No   Upstream Services Reviewed: Is patient disadvantaged to use [facility]?: No  Current Rx insurance plan: [facility] Name and location of Current pharmacy:  [place], [address] [place] [address] Phone: [phone] Fax: [phone]  [facility] services reviewed with patient today?: No  Patient requests to transfer care to [facility]?: No  Reason patient declined to change pharmacies: Disadvantaged due to insurance/mail order and Not mentioned at this visit  Compliance/Adherence/Medication fill history: Care Gaps: Vaccine: Flu/COVID  Star-Rating Drugs: Atorvastatin '10mg'$  PDC 96%   Assessment/Plan   Anxiety (Goal: Lessen anxiety to a level that allows for regular daily life) -Controlled -Current treatment: Xanax 0.'25mg'$  1qhs prn Appropriate, Effective, Safe, Accessible Citalopram '20mg'$  1 qd Appropriate, Effective, Safe, Accessible -Medications previously tried/failed: None -PHQ9: not performed today -GAD7: declined today, added stress currently from recent fall from sister but is improving -Educated on Benefits of medication for symptom control -Recommended to continue current medication  Hypothyroidism (Goal: TSH WNL) -Controlled -Current treatment  Levothyroxine 54mg 1qd Appropriate, Effective,  Safe, Accessible -Medications previously tried: None  -Recommended to continue current medication Counseled on separation from other meds and food/drink  ASan JosePharmacist [phone]

## 2022-04-10 ENCOUNTER — Ambulatory Visit: Payer: Medicare Other

## 2022-04-19 ENCOUNTER — Other Ambulatory Visit: Payer: Self-pay | Admitting: Internal Medicine

## 2022-04-19 ENCOUNTER — Other Ambulatory Visit: Payer: Self-pay | Admitting: Nurse Practitioner

## 2022-04-19 DIAGNOSIS — E785 Hyperlipidemia, unspecified: Secondary | ICD-10-CM

## 2022-04-19 DIAGNOSIS — F341 Dysthymic disorder: Secondary | ICD-10-CM

## 2022-04-20 NOTE — Telephone Encounter (Addendum)
Last AEX 04/23/21.  One year med check appt scheduled 05/06/22.

## 2022-04-21 DIAGNOSIS — M25562 Pain in left knee: Secondary | ICD-10-CM | POA: Diagnosis not present

## 2022-04-27 ENCOUNTER — Other Ambulatory Visit: Payer: Medicare Other

## 2022-04-30 ENCOUNTER — Encounter: Payer: Self-pay | Admitting: Nurse Practitioner

## 2022-05-06 ENCOUNTER — Ambulatory Visit: Payer: Medicare Other | Admitting: Nurse Practitioner

## 2022-05-22 DIAGNOSIS — D485 Neoplasm of uncertain behavior of skin: Secondary | ICD-10-CM | POA: Diagnosis not present

## 2022-05-22 DIAGNOSIS — L820 Inflamed seborrheic keratosis: Secondary | ICD-10-CM | POA: Diagnosis not present

## 2022-05-22 DIAGNOSIS — L821 Other seborrheic keratosis: Secondary | ICD-10-CM | POA: Diagnosis not present

## 2022-05-22 DIAGNOSIS — D1801 Hemangioma of skin and subcutaneous tissue: Secondary | ICD-10-CM | POA: Diagnosis not present

## 2022-05-22 DIAGNOSIS — L578 Other skin changes due to chronic exposure to nonionizing radiation: Secondary | ICD-10-CM | POA: Diagnosis not present

## 2022-05-22 DIAGNOSIS — L814 Other melanin hyperpigmentation: Secondary | ICD-10-CM | POA: Diagnosis not present

## 2022-05-28 ENCOUNTER — Ambulatory Visit: Payer: Medicare Other | Admitting: Nurse Practitioner

## 2022-05-29 ENCOUNTER — Telehealth: Payer: Self-pay

## 2022-05-29 NOTE — Progress Notes (Signed)
Patient ID: Pilar Plate, female   DOB: 1945/09/25, 77 y.o.   MRN: [id number]  Care Management & Coordination Services Pharmacy Team  Reason for Encounter: Appointment Reminder  Contacted patient  Per Milas Kocher to confirm DEXA appointment with [facility], on 06/02/22 at 11. Unsuccessful outreach. Left voicemail for patient to return call.     Star Rating Drugs:  Atorvastatin (Lipitor) 10 mg - Last filled 04/30/22 90 DS at [facility] Gaps: Hepatitis C Screen - Overdue AWV - 08/25/21  Pamala Duffel CMA Clinical Pharmacist Assistant [phone]

## 2022-06-02 ENCOUNTER — Ambulatory Visit (INDEPENDENT_AMBULATORY_CARE_PROVIDER_SITE_OTHER)
Admission: RE | Admit: 2022-06-02 | Discharge: 2022-06-02 | Disposition: A | Discharge status: Home / Self Care | Payer: Medicare Other | Source: Ambulatory Visit | Attending: Internal Medicine | Admitting: Internal Medicine

## 2022-06-02 DIAGNOSIS — Z780 Asymptomatic menopausal state: Secondary | ICD-10-CM | POA: Diagnosis not present

## 2022-06-02 DIAGNOSIS — Z13820 Encounter for screening for osteoporosis: Secondary | ICD-10-CM

## 2022-06-03 ENCOUNTER — Encounter: Payer: Self-pay | Admitting: Nurse Practitioner

## 2022-06-03 ENCOUNTER — Ambulatory Visit (INDEPENDENT_AMBULATORY_CARE_PROVIDER_SITE_OTHER): Payer: Medicare Other | Admitting: Nurse Practitioner

## 2022-06-03 VITALS — BP 122/80 | HR 66 | Ht 62.75 in | Wt 180.0 lb

## 2022-06-03 DIAGNOSIS — Z9189 Other specified personal risk factors, not elsewhere classified: Secondary | ICD-10-CM

## 2022-06-03 DIAGNOSIS — Z8619 Personal history of other infectious and parasitic diseases: Secondary | ICD-10-CM | POA: Diagnosis not present

## 2022-06-03 DIAGNOSIS — Z780 Asymptomatic menopausal state: Secondary | ICD-10-CM

## 2022-06-03 DIAGNOSIS — Z01419 Encounter for gynecological examination (general) (routine) without abnormal findings: Secondary | ICD-10-CM

## 2022-06-03 NOTE — Progress Notes (Signed)
   Vollie Aaron [facility] March 23, 1945 [id number]   History:  77 y.o. G0 presents for breast and pelvic exam. Postmenopausal - stopped HRT a few years ago. S/P 2001 TAH BSO for endometriosis and fibroids. Normal pap history. Anxiety and depression, vitamin D deficiency, pre-diabetes managed by PCP. Takes 1/2 tablet 0.25 mg xanax sparingly. Sees dermatology.   Gynecologic History No LMP recorded. Patient has had a hysterectomy.   Contraception: status post hysterectomy Sexually active: No  Health Maintenance Last Pap: 10/24/2014. Results were: Normal Last mammogram: 12/10/2021. Results were: Normal Last colonoscopy: 04/20/2017. Results were: Normal, 10-year recall Last Dexa: 06/02/2022. Results were: Normal  Past medical history, past surgical history, family history and social history were all reviewed and documented in the EPIC chart. Widowed. Has great friends, travels with them. Caring for sister who has chronic back pain. Harlow Mares has 57 yo daughter.   ROS:  A ROS was performed and pertinent positives and negatives are included.  Exam:  Vitals:   06/03/22 0946  BP: 122/80  Pulse: 66  SpO2: 97%  Weight: 180 lb (81.6 kg)  Height: 5' 2.75" (1.594 m)    Body mass index is 32.14 kg/m.  General appearance:  Normal Thyroid:  Symmetrical, normal in size, without palpable masses or nodularity. Respiratory  Auscultation:  Clear without wheezing or rhonchi Cardiovascular  Auscultation:  Regular rate, without rubs, murmurs or gallops  Edema/varicosities:  Not grossly evident Abdominal  Soft,nontender, without masses, guarding or rebound.  Liver/spleen:  No organomegaly noted  Hernia:  None appreciated  Skin  Inspection:  Grossly normal. Generalized seborrheic keratosis and cherry angiomas Breasts: Examined lying and sitting.   Right: Without masses, retractions, nipple discharge or axillary adenopathy.   Left: Without masses, retractions, nipple discharge or axillary  adenopathy. Genitourinary   Inguinal/mons:  Normal without inguinal adenopathy  External genitalia:  Normal appearing vulva with no masses, tenderness, or lesions  BUS/Urethra/Skene's glands:  Normal  Vagina:  Normal appearing with normal color and discharge, no lesions. Atrophic changes  Cervix:  Absent  Uterus:  Absent  Adnexa/parametria:     Rt: Normal in size, without masses or tenderness.   Lt: Normal in size, without masses or tenderness.  Anus and perineum: Normal  Digital rectal exam: Deferred  Patient informed chaperone available to be present for breast and pelvic exam. Patient has requested no chaperone to be present. Patient has been advised what will be completed during breast and pelvic exam.   Assessment/Plan:  77 y.o. G0 for breast and pelvic exam.   Well female exam with routine gynecological exam - Education provided on SBEs, importance of preventative screenings, current guidelines, high calcium diet, regular exercise, and multivitamin daily. Labs with PCP.   Postmenopausal - Stopped HRT a few years ago. S/P 2001 TAH BSO for endometriosis and fibroids.   Screening for cervical cancer - Normal Pap history. No longer screening per guidelines.   Screening for breast cancer - Normal mammogram history.  Continue annual screenings.  Normal breast exam today.  Screening for colon cancer - 2019 colonoscopy. See GI soon for LLQ pain.   Screening for osteoporosis - Normal bone density yesterday. Managed by PCP. Continue Vitamin D + Calcium supplement, increase exercise. Considering membership at [employer].   Return in 2 years for breast and pelvic exam.      Olivia Mackie DNP, 10:03 AM 06/03/2022

## 2022-06-25 DIAGNOSIS — M1711 Unilateral primary osteoarthritis, right knee: Secondary | ICD-10-CM | POA: Diagnosis not present

## 2022-06-26 ENCOUNTER — Telehealth: Payer: Self-pay | Admitting: Internal Medicine

## 2022-06-26 NOTE — Telephone Encounter (Signed)
Last OV 08/25/21, MD recommendations to f/u in 6 months. LVM instructions for pt to call office to schedule an appt to come in to have A1c checked & update on chronic conditions.

## 2022-06-26 NOTE — Telephone Encounter (Signed)
Pt has cpe sch for July and would like to A1c check prior

## 2022-06-29 ENCOUNTER — Other Ambulatory Visit: Payer: Self-pay | Admitting: Nurse Practitioner

## 2022-06-30 NOTE — Telephone Encounter (Signed)
Med refill request: valacyclovir 500 mg tab BID PRN Last AEX: 06/03/22 -TW Next AEX: Not scheduled  Last MMG (if hormonal med) N/A  HX HSV 2 Refill authorized: Please Advise?

## 2022-07-01 DIAGNOSIS — H52203 Unspecified astigmatism, bilateral: Secondary | ICD-10-CM | POA: Diagnosis not present

## 2022-07-01 DIAGNOSIS — H524 Presbyopia: Secondary | ICD-10-CM | POA: Diagnosis not present

## 2022-07-01 DIAGNOSIS — H5213 Myopia, bilateral: Secondary | ICD-10-CM | POA: Diagnosis not present

## 2022-07-01 DIAGNOSIS — Z961 Presence of intraocular lens: Secondary | ICD-10-CM | POA: Diagnosis not present

## 2022-07-01 DIAGNOSIS — R7303 Prediabetes: Secondary | ICD-10-CM | POA: Diagnosis not present

## 2022-07-21 ENCOUNTER — Other Ambulatory Visit: Payer: Self-pay | Admitting: Nurse Practitioner

## 2022-07-21 DIAGNOSIS — F341 Dysthymic disorder: Secondary | ICD-10-CM

## 2022-07-21 NOTE — Telephone Encounter (Signed)
Medication refill request: celexa 20mg  Last AEX:  06/03/22 Next AEX: not scheduled  Last MMG (if hormonal medication request): n/a Refill authorized: #90 with 3 rf pended for today please advise.

## 2022-07-29 DIAGNOSIS — K589 Irritable bowel syndrome without diarrhea: Secondary | ICD-10-CM | POA: Diagnosis not present

## 2022-07-29 DIAGNOSIS — R7303 Prediabetes: Secondary | ICD-10-CM | POA: Diagnosis not present

## 2022-07-29 DIAGNOSIS — R1032 Left lower quadrant pain: Secondary | ICD-10-CM | POA: Diagnosis not present

## 2022-07-29 DIAGNOSIS — G8929 Other chronic pain: Secondary | ICD-10-CM | POA: Diagnosis not present

## 2022-08-24 ENCOUNTER — Other Ambulatory Visit: Payer: Self-pay | Admitting: Internal Medicine

## 2022-08-24 DIAGNOSIS — E039 Hypothyroidism, unspecified: Secondary | ICD-10-CM

## 2022-09-01 ENCOUNTER — Ambulatory Visit (INDEPENDENT_AMBULATORY_CARE_PROVIDER_SITE_OTHER): Payer: Medicare Other | Admitting: Internal Medicine

## 2022-09-01 ENCOUNTER — Encounter: Payer: Self-pay | Admitting: Internal Medicine

## 2022-09-01 VITALS — BP 112/74 | HR 62 | Temp 98.5°F | Ht 65.0 in | Wt 176.0 lb

## 2022-09-01 DIAGNOSIS — Z1159 Encounter for screening for other viral diseases: Secondary | ICD-10-CM | POA: Diagnosis not present

## 2022-09-01 DIAGNOSIS — E785 Hyperlipidemia, unspecified: Secondary | ICD-10-CM | POA: Diagnosis not present

## 2022-09-01 DIAGNOSIS — E559 Vitamin D deficiency, unspecified: Secondary | ICD-10-CM

## 2022-09-01 DIAGNOSIS — E039 Hypothyroidism, unspecified: Secondary | ICD-10-CM | POA: Diagnosis not present

## 2022-09-01 DIAGNOSIS — K589 Irritable bowel syndrome without diarrhea: Secondary | ICD-10-CM

## 2022-09-01 DIAGNOSIS — R7302 Impaired glucose tolerance (oral): Secondary | ICD-10-CM

## 2022-09-01 DIAGNOSIS — Z0000 Encounter for general adult medical examination without abnormal findings: Secondary | ICD-10-CM

## 2022-09-01 DIAGNOSIS — H9190 Unspecified hearing loss, unspecified ear: Secondary | ICD-10-CM | POA: Diagnosis not present

## 2022-09-01 LAB — COMPREHENSIVE METABOLIC PANEL
ALT: 13 U/L (ref 0–35)
AST: 18 U/L (ref 0–37)
Albumin: 4.1 g/dL (ref 3.5–5.2)
Alkaline Phosphatase: 74 U/L (ref 39–117)
BUN: 15 mg/dL (ref 6–23)
CO2: 28 mEq/L (ref 19–32)
Calcium: 9.5 mg/dL (ref 8.4–10.5)
Chloride: 105 mEq/L (ref 96–112)
Creatinine, Ser: 0.64 mg/dL (ref 0.40–1.20)
GFR: 86.06 mL/min (ref >60.00)
Glucose, Bld: 86 mg/dL (ref 70–99)
Potassium: 4.4 mEq/L (ref 3.5–5.1)
Sodium: 139 mEq/L (ref 135–145)
Total Bilirubin: 0.6 mg/dL (ref 0.2–1.2)
Total Protein: 6.9 g/dL (ref 6.0–8.3)

## 2022-09-01 LAB — LIPID PANEL
Cholesterol: 162 mg/dL (ref 0–200)
HDL: 72.2 mg/dL (ref >39.00)
LDL Cholesterol: 80 mg/dL (ref 0–99)
NonHDL: 90.04
Total CHOL/HDL Ratio: 2
Triglycerides: 50 mg/dL (ref 0.0–149.0)
VLDL: 10 mg/dL (ref 0.0–40.0)

## 2022-09-01 LAB — CBC WITH DIFFERENTIAL/PLATELET
Basophils Absolute: 0.1 10*3/uL (ref 0.0–0.1)
Basophils Relative: 1.2 % (ref 0.0–3.0)
Eosinophils Absolute: 0.2 10*3/uL (ref 0.0–0.7)
Eosinophils Relative: 2.9 % (ref 0.0–5.0)
HCT: 43.2 % (ref 36.0–46.0)
Hemoglobin: 13.6 g/dL (ref 12.0–15.0)
Lymphocytes Relative: 23.5 % (ref 12.0–46.0)
Lymphs Abs: 1.5 10*3/uL (ref 0.7–4.0)
MCHC: 31.6 g/dL (ref 30.0–36.0)
MCV: 93 fl (ref 78.0–100.0)
Monocytes Absolute: 0.6 10*3/uL (ref 0.1–1.0)
Monocytes Relative: 9.4 % (ref 3.0–12.0)
Neutro Abs: 4 10*3/uL (ref 1.4–7.7)
Neutrophils Relative %: 63 % (ref 43.0–77.0)
Platelets: 356 10*3/uL (ref 150.0–400.0)
RBC: 4.64 Mil/uL (ref 3.87–5.11)
RDW: 15.4 % (ref 11.5–15.5)
WBC: 6.4 10*3/uL (ref 4.0–10.5)

## 2022-09-01 LAB — VITAMIN B12: Vitamin B-12: 262 pg/mL (ref 211–911)

## 2022-09-01 LAB — TSH: TSH: 2.62 u[IU]/mL (ref 0.35–5.50)

## 2022-09-01 LAB — VITAMIN D 25 HYDROXY (VIT D DEFICIENCY, FRACTURES): VITD: 35.57 ng/mL (ref 30.00–100.00)

## 2022-09-01 NOTE — Progress Notes (Signed)
Established Patient Office Visit     CC/Reason for Visit: Annual preventive exam and subsequent Medicare wellness visit  HPI: Brianna Mack is a 77 y.o. female who is coming in today for the above mentioned reasons. Past Medical History is significant for: Hyperlipidemia, impaired glucose tolerance, hypothyroidism, vitamin D deficiency and depression.  She is feeling well.  Increased social stressors.  She has routine eye and dental care.  She has noticed some decreased hearing and is requesting referral to radiology.  Screening Is Up-To-Date.  She Is Requesting Referral to Local GI As Her Previous One Is in [place].   Past Medical/Surgical History: Past Medical History:  Diagnosis Date   Anxiety    Arthritis    Depression    HSV (herpes simplex virus) anogenital infection 11/2015   IBS (irritable bowel syndrome)    Pemphigus vulgaris    Stress fracture     Past Surgical History:  Procedure Laterality Date   ABDOMINAL HYSTERECTOMY  2001   BSO ; SLING PROCEDURE endometriosis/leiomyomata   CATARACT EXTRACTION, BILATERAL     CHOLECYSTECTOMY  1976   DILATION AND CURETTAGE OF UTERUS     HAND SURGERY  04-02-2011   right   OOPHORECTOMY     BSO   ORIF SHOULDER FRACTURE Right 06/28/2014   Procedure: RIGHT SHOULDER HEMI-ARTHROPLASTY;  Surgeon: Beverely Low, MD;  Location: [facility];  Service: Orthopedics;  Laterality: Right;   TONSILLECTOMY  1954    Social History:  reports that she quit smoking about 45 years ago. Her smoking use included cigarettes. She has never used smokeless tobacco. She reports current alcohol use. She reports that she does not use drugs.  Allergies: Allergies  Allergen Reactions   Augmentin [Amoxicillin-Pot Clavulanate] Itching and Swelling    Headache   Meloxicam Other (See Comments)    Stomach upset   Cephalexin Diarrhea   Codeine Sulfate     REACTION: nausea, vomiting   Fluconazole Itching and Swelling    REACTION: swelling and itching    Morphine     REACTION: nausea, itching   Oxycodone Nausea Only   Sulfonamide Derivatives     Other Reaction(s): Unknown   Amoxicillin Rash   Cyclobenzaprine     Per patient makes her "very groggy"-unlike usual effect   Other Swelling    Pt states shes also allergic to an antibiotic but does not know the name or have her list with her today, this antibiotic causes face swelling.    Family History:  Family History  Problem Relation Age of Onset   Diabetes Mother    Hypertension Mother    Cancer Mother        ANGIOSARCOMA   COPD Father    Thyroid disease Sister    Scoliosis Sister      Current Outpatient Medications:    acetaminophen (TYLENOL) 500 MG tablet, Take 1 tablet (500 mg total) by mouth every 6 (six) hours as needed for mild pain or moderate pain., Disp: 30 tablet, Rfl: 0   ALPRAZolam (XANAX) 0.25 MG tablet, TAKE ONE TABLET BY MOUTH AT BEDTIME AS NEEDED FOR ANXIETY, Disp: 30 tablet, Rfl: 1   atorvastatin (LIPITOR) 10 MG tablet, Take 1 tablet (10 mg total) by mouth daily., Disp: 90 tablet, Rfl: 1   Calcium Carb-Cholecalciferol (CALCIUM 500 + D) 500-3.125 MG-MCG TABS, Take by mouth., Disp: , Rfl:    cetirizine (ZYRTEC) 10 MG tablet, Take 10 mg by mouth daily as needed., Disp: , Rfl:  cholecalciferol (VITAMIN D3) 25 MCG (1000 UNIT) tablet, Take 1,000 Units by mouth daily., Disp: , Rfl:    citalopram (CELEXA) 20 MG tablet, TAKE ONE TABLET BY MOUTH EVERY DAY, Disp: 90 tablet, Rfl: 3   diphenhydrAMINE HCl (BENADRYL PO), Benadryl, Disp: , Rfl:    hyoscyamine (LEVBID) 0.375 MG 12 hr tablet, Take 0.375 mg by mouth 2 (two) times daily., Disp: , Rfl:    hyoscyamine (LEVSIN) 0.125 MG tablet, Take 0.125 mg by mouth every 4 (four) hours as needed., Disp: , Rfl:    Lactobacillus (ACIDOPHILUS PO), Acidophilus, Disp: , Rfl:    levothyroxine (SYNTHROID) 25 MCG tablet, Take 1 tablet (25 mcg total) by mouth daily., Disp: 90 tablet, Rfl: 0   Propylene Glycol (SYSTANE BALANCE) 0.6 % SOLN, 1  drop Once Daily., Disp: , Rfl:    valACYclovir (VALTREX) 500 MG tablet, Take 1 tablet (500 mg total) by mouth 2 (two) times daily as needed., Disp: 30 tablet, Rfl: 1  Review of Systems:  Negative unless indicated in HPI.   Physical Exam: Vitals:   09/01/22 0959  BP: 112/74  Pulse: 62  Temp: 98.5 F (36.9 C)  TempSrc: Oral  SpO2: 99%  Weight: 176 lb (79.8 kg)  Height: 5\' 5"  (1.651 m)    Body mass index is 29.29 kg/m.   Physical Exam Vitals reviewed.  Constitutional:      General: She is not in acute distress.    Appearance: Normal appearance. She is not ill-appearing, toxic-appearing or diaphoretic.  HENT:     Head: Normocephalic.     Right Ear: Tympanic membrane, ear canal and external ear normal. There is no impacted cerumen.     Left Ear: Tympanic membrane, ear canal and external ear normal. There is no impacted cerumen.     Nose: Nose normal.     Mouth/Throat:     Mouth: Mucous membranes are moist.     Pharynx: Oropharynx is clear. No oropharyngeal exudate or posterior oropharyngeal erythema.  Eyes:     General: No scleral icterus.       Right eye: No discharge.        Left eye: No discharge.     Conjunctiva/sclera: Conjunctivae normal.     Pupils: Pupils are equal, round, and reactive to light.  Neck:     Vascular: No carotid bruit.  Cardiovascular:     Rate and Rhythm: Normal rate and regular rhythm.     Pulses: Normal pulses.     Heart sounds: Normal heart sounds.  Pulmonary:     Effort: Pulmonary effort is normal. No respiratory distress.     Breath sounds: Normal breath sounds.  Abdominal:     General: Abdomen is flat. Bowel sounds are normal.     Palpations: Abdomen is soft.  Musculoskeletal:        General: Normal range of motion.     Cervical back: Normal range of motion.  Skin:    General: Skin is warm and dry.  Neurological:     General: No focal deficit present.     Mental Status: She is alert and oriented to person, place, and time. Mental  status is at baseline.  Psychiatric:        Mood and Affect: Mood normal.        Behavior: Behavior normal.        Thought Content: Thought content normal.        Judgment: Judgment normal.    Subsequent Medicare wellness visit   1.  Risk factors, based on past  M,S,F - Cardiac Risk Factors include: advanced age (>64men, >6 women)   2.  Physical activities: Dietary issues and exercise activities discussed:      3.  Depression/mood:  Flowsheet Row Office Visit from 09/01/2022 in [facility] Total Score 0        4.  ADL's:    08/31/2022    4:56 PM  In your present state of health, do you have any difficulty performing the following activities:  Hearing? 0  Vision? 0  Difficulty concentrating or making decisions? 0  Walking or climbing stairs? 0  Dressing or bathing? 0  Doing errands, shopping? 0  Preparing Food and eating ? N  Using the Toilet? N  In the past six months, have you accidently leaked urine? Y  Do you have problems with loss of bowel control? N  Managing your Medications? N  Managing your Finances? N  Housekeeping or managing your Housekeeping? N     5.  Fall risk:     01/22/2021   10:54 AM 01/22/2021   10:57 AM 02/18/2022    8:54 AM 06/03/2022    9:45 AM 08/31/2022    4:59 PM  Fall Risk  Falls in the past year? 0 0 1 1 0  Was there an injury with Fall? 0 0 1 0 0  Was there an injury with Fall? - Comments   Broken tooth    Fall Risk Category Calculator 0 0 2 1 0  Fall Risk Category (Retired) Low Low Moderate    (RETIRED) Patient Fall Risk Level   Low fall risk    Patient at Risk for Falls Due to   No Fall Risks    Fall risk Follow up   Falls evaluation completed  Falls evaluation completed     6.  Home safety: No problems identified   7.  Height weight, and visual acuity: height and weight as above, vision/hearing: Vision Screening   Right eye Left eye Both eyes  Without correction     With correction 20/40  20/40 20/40     8.  Counseling: Counseling given: Not Answered    9. Lab orders based on risk factors: Laboratory update will be reviewed   10. Cognitive assessment:        08/31/2022    5:01 PM  6CIT Screen  What Year? 0 points  What month? 0 points  What time? 0 points  Count back from 20 0 points  Months in reverse 0 points  Repeat phrase 0 points  Total Score 0 points     11. Screening: Patient provided with a written and personalized 5-10 year screening schedule in the AVS. Health Maintenance  Topic Date Due   Hepatitis C Screening  Never done   COVID-19 Vaccine (5 - 2023-24 season) 10/17/2021   Flu Shot  09/17/2022   Medicare Annual Wellness Visit  09/01/2023   DTaP/Tdap/Td vaccine (2 - Td or Tdap) 06/26/2024   Pneumonia Vaccine  Completed   DEXA scan (bone density measurement)  Completed   Zoster (Shingles) Vaccine  Completed   HPV Vaccine  Aged Out   Colon Cancer Screening  Discontinued    12. Provider List Update: Patient Care Team    Relationship Specialty Notifications Start End  Philip Aspen, Limmie Patricia, MD PCP - General Internal Medicine  01/21/18   Sherrill Raring, [facility] (Inactive)  Pharmacist  04/10/22      13.  Advance Directives: Does Patient Have a Medical Advance Directive?: Yes Type of Advance Directive: Healthcare Power of Attorney, Living will, Out of facility DNR (pink MOST or yellow form) Does patient want to make changes to medical advance directive?: No - Patient declined Copy of Healthcare Power of Attorney in Chart?: No - copy requested  14. Opioids: Patient is not on any opioid prescriptions and has no risk factors for a substance use disorder.   15.   Goals      organize home         I have personally reviewed and noted the following in the patient's chart:   Medical and social history Use of alcohol, tobacco or illicit drugs  Current medications and supplements Functional ability and status Nutritional status Physical  activity Advanced directives List of other physicians Hospitalizations, surgeries, and ER visits in previous 12 months Vitals Screenings to include cognitive, depression, and falls Referrals and appointments  In addition, I have reviewed and discussed with patient certain preventive protocols, quality metrics, and best practice recommendations. A written personalized care plan for preventive services as well as general preventive health recommendations were provided to patient.   Impression and Plan:  Acquired hypothyroidism -     TSH; Future -     Ambulatory referral to Endocrinology  Encounter for preventive health examination  Vitamin D deficiency -     VITAMIN D 25 Hydroxy (Vit-D Deficiency, Fractures); Future  Hyperlipidemia, unspecified hyperlipidemia type -     CBC with Differential/Platelet; Future -     Comprehensive metabolic panel; Future -     Lipid panel; Future -     Vitamin B12; Future  IGT (impaired glucose tolerance) -     Amb Referral to Nutrition and Diabetic Education  Hearing loss, unspecified hearing loss type, unspecified laterality -     Ambulatory referral to Audiology  Irritable bowel syndrome, unspecified type -     Ambulatory referral to Gastroenterology  Medicare annual wellness visit, subsequent  Encounter for hepatitis C screening test for low risk patient -     Hepatitis C antibody; Future  -Recommend routine eye and dental care. -Healthy lifestyle discussed in detail. -Labs to be updated today. -Prostate cancer screening: N/A Health Maintenance  Topic Date Due   Hepatitis C Screening  Never done   COVID-19 Vaccine (5 - 2023-24 season) 10/17/2021   Flu Shot  09/17/2022   Medicare Annual Wellness Visit  09/01/2023   DTaP/Tdap/Td vaccine (2 - Td or Tdap) 06/26/2024   Pneumonia Vaccine  Completed   DEXA scan (bone density measurement)  Completed   Zoster (Shingles) Vaccine  Completed   HPV Vaccine  Aged Out   Colon Cancer Screening   Discontinued       Brianna Mack Philip Aspen, MD [facility]

## 2022-09-01 NOTE — Patient Instructions (Signed)
 -  Nice seeing you today!!  -Lab work today; will notify you once results are available.  -Schedule follow up in 6 months. 

## 2022-09-02 ENCOUNTER — Ambulatory Visit: Payer: Medicare Other

## 2022-09-02 ENCOUNTER — Encounter: Payer: Self-pay | Admitting: Internal Medicine

## 2022-09-02 DIAGNOSIS — R7302 Impaired glucose tolerance (oral): Secondary | ICD-10-CM

## 2022-09-02 LAB — HEMOGLOBIN A1C: Hgb A1c MFr Bld: 6 % (ref 4.6–6.5)

## 2022-09-02 LAB — HEPATITIS C ANTIBODY: Hepatitis C Ab: NONREACTIVE

## 2022-09-02 NOTE — Telephone Encounter (Signed)
A1c add on sheet faxed to [facility] lab.

## 2022-09-08 DIAGNOSIS — E039 Hypothyroidism, unspecified: Secondary | ICD-10-CM | POA: Diagnosis not present

## 2022-09-08 DIAGNOSIS — R1032 Left lower quadrant pain: Secondary | ICD-10-CM | POA: Diagnosis not present

## 2022-09-08 DIAGNOSIS — R7309 Other abnormal glucose: Secondary | ICD-10-CM | POA: Diagnosis not present

## 2022-09-08 DIAGNOSIS — G8929 Other chronic pain: Secondary | ICD-10-CM | POA: Diagnosis not present

## 2022-09-15 ENCOUNTER — Ambulatory Visit: Payer: Medicare Other | Admitting: Audiology

## 2022-09-17 ENCOUNTER — Telehealth: Payer: Self-pay | Admitting: Gastroenterology

## 2022-09-17 NOTE — Telephone Encounter (Signed)
Good Morning Dr. Meridee Score,   Patient called stating that her PCP Dr. Ardyth Harps had sent a referral to our practice requesting patient to see you and for her to be seen for IBS. Patient stated that she has previous GI history with  Dr. Kinnie Scales with [facility]. Looking at patients chart, she was last seen with them in June of 2024. Patient is requesting transfer of care due to wanting to have all [facility] providers. Patients records are in epic. Will you please review and advise on scheduling patient?  Thank you.

## 2022-09-17 NOTE — Telephone Encounter (Signed)
Request received to transfer GI care from outside practice to [facility].  We appreciate the interest in our practice, however at this time due to high demand from patients without established GI providers we cannot accommodate this transfer.  Ability to accommodate future transfer requests may change over time and the patient can contact us again in 6-12 months if still interested in being seen at [facility].      °

## 2022-09-25 NOTE — Telephone Encounter (Signed)
Patient advised.

## 2022-10-06 ENCOUNTER — Ambulatory Visit: Payer: Medicare Other | Admitting: Audiology

## 2022-10-14 DIAGNOSIS — R7303 Prediabetes: Secondary | ICD-10-CM | POA: Diagnosis not present

## 2022-10-21 ENCOUNTER — Ambulatory Visit (INDEPENDENT_AMBULATORY_CARE_PROVIDER_SITE_OTHER): Payer: Medicare Other | Admitting: Internal Medicine

## 2022-10-21 VITALS — BP 118/70 | HR 64 | Temp 98.5°F | Wt 180.0 lb

## 2022-10-21 DIAGNOSIS — R1032 Left lower quadrant pain: Secondary | ICD-10-CM | POA: Diagnosis not present

## 2022-10-21 DIAGNOSIS — Z23 Encounter for immunization: Secondary | ICD-10-CM

## 2022-10-21 NOTE — Progress Notes (Signed)
Established Patient Office Visit     CC/Reason for Visit: Discuss abdominal pain  HPI: Brianna Mack is a 77 y.o. female who is coming in today for the above mentioned reasons.  She has had 2 distinct episodes of left lower quadrant abdominal pain once in June and 1 in August.  She describes abrupt onset of left lower quadrant pain with nausea and vomiting with the pain resolving after a few hours.  She has done some research and wonders if this might be diverticulitis.  Requesting flu vaccine.  Past Medical/Surgical History: Past Medical History:  Diagnosis Date   Anxiety    Arthritis    Depression    HSV (herpes simplex virus) anogenital infection 11/2015   IBS (irritable bowel syndrome)    Pemphigus vulgaris    Stress fracture     Past Surgical History:  Procedure Laterality Date   ABDOMINAL HYSTERECTOMY  2001   BSO ; SLING PROCEDURE endometriosis/leiomyomata   CATARACT EXTRACTION, BILATERAL     CHOLECYSTECTOMY  1976   DILATION AND CURETTAGE OF UTERUS     HAND SURGERY  04-02-2011   right   OOPHORECTOMY     BSO   ORIF SHOULDER FRACTURE Right 06/28/2014   Procedure: RIGHT SHOULDER HEMI-ARTHROPLASTY;  Surgeon: Beverely Low, MD;  Location: [facility];  Service: Orthopedics;  Laterality: Right;   TONSILLECTOMY  1954    Social History:  reports that she quit smoking about 45 years ago. Her smoking use included cigarettes. She has never used smokeless tobacco. She reports current alcohol use. She reports that she does not use drugs.  Allergies: Allergies  Allergen Reactions   Augmentin [Amoxicillin-Pot Clavulanate] Itching and Swelling    Headache   Meloxicam Other (See Comments)    Stomach upset   Cephalexin Diarrhea   Codeine Sulfate     REACTION: nausea, vomiting   Fluconazole Itching and Swelling    REACTION: swelling and itching   Morphine     REACTION: nausea, itching   Oxycodone Nausea Only   Sulfonamide Derivatives     Other Reaction(s): Unknown    Amoxicillin Rash   Cyclobenzaprine     Per patient makes her "very groggy"-unlike usual effect   Other Swelling    Pt states shes also allergic to an antibiotic but does not know the name or have her list with her today, this antibiotic causes face swelling.    Family History:  Family History  Problem Relation Age of Onset   Diabetes Mother    Hypertension Mother    Cancer Mother        ANGIOSARCOMA   COPD Father    Thyroid disease Sister    Scoliosis Sister      Current Outpatient Medications:    acetaminophen (TYLENOL) 500 MG tablet, Take 1 tablet (500 mg total) by mouth every 6 (six) hours as needed for mild pain or moderate pain., Disp: 30 tablet, Rfl: 0   ALPRAZolam (XANAX) 0.25 MG tablet, TAKE ONE TABLET BY MOUTH AT BEDTIME AS NEEDED FOR ANXIETY, Disp: 30 tablet, Rfl: 1   atorvastatin (LIPITOR) 10 MG tablet, Take 1 tablet (10 mg total) by mouth daily., Disp: 90 tablet, Rfl: 1   Calcium Carb-Cholecalciferol (CALCIUM 500 + D) 500-3.125 MG-MCG TABS, Take by mouth., Disp: , Rfl:    cetirizine (ZYRTEC) 10 MG tablet, Take 10 mg by mouth daily as needed., Disp: , Rfl:    cholecalciferol (VITAMIN D3) 25 MCG (1000 UNIT) tablet, Take 1,000 Units by  mouth daily., Disp: , Rfl:    citalopram (CELEXA) 20 MG tablet, TAKE ONE TABLET BY MOUTH EVERY DAY, Disp: 90 tablet, Rfl: 3   diphenhydrAMINE HCl (BENADRYL PO), Benadryl, Disp: , Rfl:    hyoscyamine (LEVBID) 0.375 MG 12 hr tablet, Take 0.375 mg by mouth 2 (two) times daily., Disp: , Rfl:    hyoscyamine (LEVSIN) 0.125 MG tablet, Take 0.125 mg by mouth every 4 (four) hours as needed., Disp: , Rfl:    Lactobacillus (ACIDOPHILUS PO), Acidophilus, Disp: , Rfl:    levothyroxine (SYNTHROID) 25 MCG tablet, Take 1 tablet (25 mcg total) by mouth daily., Disp: 90 tablet, Rfl: 0   Propylene Glycol (SYSTANE BALANCE) 0.6 % SOLN, 1 drop Once Daily., Disp: , Rfl:    valACYclovir (VALTREX) 500 MG tablet, Take 1 tablet (500 mg total) by mouth 2 (two) times  daily as needed., Disp: 30 tablet, Rfl: 1  Review of Systems:  Negative unless indicated in HPI.   Physical Exam: Vitals:   10/21/22 1132  BP: 118/70  Pulse: 64  Temp: 98.5 F (36.9 C)  TempSrc: Oral  SpO2: 95%  Weight: 180 lb (81.6 kg)    Body mass index is 29.95 kg/m.   Physical Exam Vitals reviewed.  Constitutional:      Appearance: Normal appearance.  HENT:     Head: Normocephalic and atraumatic.  Eyes:     Conjunctiva/sclera: Conjunctivae normal.     Pupils: Pupils are equal, round, and reactive to light.  Skin:    General: Skin is warm and dry.  Neurological:     General: No focal deficit present.     Mental Status: She is alert and oriented to person, place, and time.  Psychiatric:        Mood and Affect: Mood normal.        Behavior: Behavior normal.        Thought Content: Thought content normal.        Judgment: Judgment normal.      Impression and Plan:  Need for immunization against influenza -     Flu Vaccine Trivalent High Dose (Fluad)  Left lower quadrant abdominal pain   -Flu vaccine administered in office today. -I think it is unlikely for this to be diverticulitis as there is no mention of diverticulosis in her colonoscopy from 2019. -Nonetheless, since no active symptoms no need to workup today.   Time spent:30 minutes reviewing chart, interviewing and examining patient and formulating plan of care.     Chaya Jan, MD [facility]

## 2022-10-24 ENCOUNTER — Other Ambulatory Visit: Payer: Self-pay | Admitting: Nurse Practitioner

## 2022-10-24 ENCOUNTER — Other Ambulatory Visit: Payer: Self-pay | Admitting: Internal Medicine

## 2022-10-24 DIAGNOSIS — E785 Hyperlipidemia, unspecified: Secondary | ICD-10-CM

## 2022-10-27 NOTE — Telephone Encounter (Signed)
Med refill request: Xanax 0.25 mg tab PRN at HS for anxiety Last AEX: 06/03/22 -TW Next AEX: Not scheduled Last MMG (if hormonal med) N/A  Last RX sent 03/10/22 Refill authorized: Please Advise?

## 2022-11-20 ENCOUNTER — Other Ambulatory Visit: Payer: Self-pay | Admitting: Internal Medicine

## 2022-11-20 DIAGNOSIS — E039 Hypothyroidism, unspecified: Secondary | ICD-10-CM

## 2022-12-16 DIAGNOSIS — Z1231 Encounter for screening mammogram for malignant neoplasm of breast: Secondary | ICD-10-CM | POA: Diagnosis not present

## 2022-12-16 LAB — HM MAMMOGRAPHY

## 2022-12-17 ENCOUNTER — Encounter: Payer: Self-pay | Admitting: Internal Medicine

## 2022-12-30 DIAGNOSIS — R7309 Other abnormal glucose: Secondary | ICD-10-CM | POA: Diagnosis not present

## 2023-01-06 DIAGNOSIS — R7303 Prediabetes: Secondary | ICD-10-CM | POA: Diagnosis not present

## 2023-01-06 DIAGNOSIS — R5383 Other fatigue: Secondary | ICD-10-CM | POA: Diagnosis not present

## 2023-01-06 DIAGNOSIS — E039 Hypothyroidism, unspecified: Secondary | ICD-10-CM | POA: Diagnosis not present

## 2023-01-12 DIAGNOSIS — M1712 Unilateral primary osteoarthritis, left knee: Secondary | ICD-10-CM | POA: Diagnosis not present

## 2023-02-07 ENCOUNTER — Other Ambulatory Visit: Payer: Self-pay | Admitting: Nurse Practitioner

## 2023-02-08 NOTE — Telephone Encounter (Signed)
Med refill request: Valtrex Last AEX: 04/23/2021 Next AEX: not schedule, will send message to front desk to schedule annual visit Last MMG (if hormonal med) 12/16/2022 Refill authorized: Last Rx was sent #30 with 1 refills on 06/30/2022. Please approve or deny as appropriate.

## 2023-02-22 ENCOUNTER — Other Ambulatory Visit: Payer: Self-pay | Admitting: Internal Medicine

## 2023-02-22 DIAGNOSIS — E039 Hypothyroidism, unspecified: Secondary | ICD-10-CM

## 2023-02-27 DIAGNOSIS — R059 Cough, unspecified: Secondary | ICD-10-CM | POA: Diagnosis not present

## 2023-02-27 DIAGNOSIS — J209 Acute bronchitis, unspecified: Secondary | ICD-10-CM | POA: Diagnosis not present

## 2023-03-01 ENCOUNTER — Other Ambulatory Visit: Payer: Self-pay | Admitting: Internal Medicine

## 2023-03-01 ENCOUNTER — Telehealth: Payer: Self-pay

## 2023-03-01 DIAGNOSIS — R051 Acute cough: Secondary | ICD-10-CM

## 2023-03-01 MED ORDER — BENZONATATE 100 MG PO CAPS: 100.0000 mg | ORAL_CAPSULE | Freq: Two times a day (BID) | ORAL | 0 refills | Status: DC | PRN

## 2023-03-01 NOTE — Telephone Encounter (Signed)
 Copied from CRM [id number]. Topic: Clinical - Medication Question >> Mar 01, 2023 10:57 AM Burnard DEL wrote: Reason for CRM: patient was seen at urgent care and diagnosed with bronchitis. They gave her 2 injections of a steroid and an antibiotic,but told her that they wouldn't last long.She called in stating that she has a horrible cough that she's not able to get rid of.She would like to know if a cough syrup could be sent in for her.In the past she has used hydrocodone  cough syrup. She said that usually seems to help her.  [facility] [place], [place] - [address]  Phone: [phone] Fax: [phone]

## 2023-03-01 NOTE — Telephone Encounter (Signed)
 Left detailed message on machine for patient with Dr Ardyth Harps recommendation.

## 2023-03-03 ENCOUNTER — Encounter: Payer: Self-pay | Admitting: Internal Medicine

## 2023-03-03 ENCOUNTER — Telehealth: Payer: Self-pay

## 2023-03-03 ENCOUNTER — Ambulatory Visit (INDEPENDENT_AMBULATORY_CARE_PROVIDER_SITE_OTHER): Payer: Medicare Other | Admitting: Internal Medicine

## 2023-03-03 VITALS — BP 130/84 | HR 70 | Temp 98.3°F | Wt 183.0 lb

## 2023-03-03 DIAGNOSIS — R29898 Other symptoms and signs involving the musculoskeletal system: Secondary | ICD-10-CM

## 2023-03-03 DIAGNOSIS — J069 Acute upper respiratory infection, unspecified: Secondary | ICD-10-CM | POA: Diagnosis not present

## 2023-03-03 DIAGNOSIS — E039 Hypothyroidism, unspecified: Secondary | ICD-10-CM | POA: Diagnosis not present

## 2023-03-03 DIAGNOSIS — E785 Hyperlipidemia, unspecified: Secondary | ICD-10-CM

## 2023-03-03 DIAGNOSIS — R7302 Impaired glucose tolerance (oral): Secondary | ICD-10-CM

## 2023-03-03 LAB — POCT GLYCOSYLATED HEMOGLOBIN (HGB A1C): Hemoglobin A1C: 5.8 % — AB (ref 4.0–5.6)

## 2023-03-03 MED ORDER — HYDROCODONE BIT-HOMATROP MBR 5-1.5 MG/5ML PO SOLN: 5.0000 mL | Freq: Three times a day (TID) | ORAL | 0 refills | Status: DC | PRN

## 2023-03-03 NOTE — Telephone Encounter (Signed)
 Copied from CRM [id number]. Topic: Clinical - Medical Advice >> Mar 03, 2023  8:08 AM Crist Dominion wrote: Reason for CRM: Patient is is seeking confirmation on if she will needs to fast for her appointment this morning at 11 AM

## 2023-03-03 NOTE — Progress Notes (Signed)
 Established Patient Office Visit     CC/Reason for Visit: Follow-up urgent care, continued cough, follow-up chronic conditions, left arm weakness  HPI: Brianna Mack is a 78 y.o. female who is coming in today for the above mentioned reasons. Past Medical History is significant for: Hyperlipidemia, impaired glucose tolerance, hypothyroidism, vitamin D  deficiency, depression.  About 2 weeks ago she developed URI symptoms.  Visited urgent care.  Apparently flu, COVID, RSV tests were all negative.  She was given intramuscular steroids and antibiotics.  She is mostly improved other than a residual cough with clear sputum production.  She is requesting cough suppressants.  About 4 weeks ago she had an episode of transient left arm weakness, this lasted about 8 minutes.  She did not seek medical attention at that time.   Past Medical/Surgical History: Past Medical History:  Diagnosis Date   Anxiety    Arthritis    Depression    HSV (herpes simplex virus) anogenital infection 11/2015   IBS (irritable bowel syndrome)    Pemphigus vulgaris    Stress fracture     Past Surgical History:  Procedure Laterality Date   ABDOMINAL HYSTERECTOMY  2001   BSO ; SLING PROCEDURE endometriosis/leiomyomata   CATARACT EXTRACTION, BILATERAL     CHOLECYSTECTOMY  1976   DILATION AND CURETTAGE OF UTERUS     HAND SURGERY  04-02-2011   right   OOPHORECTOMY     BSO   ORIF SHOULDER FRACTURE Right 06/28/2014   Procedure: RIGHT SHOULDER HEMI-ARTHROPLASTY;  Surgeon: Winston Hawking, MD;  Location: [facility];  Service: Orthopedics;  Laterality: Right;   TONSILLECTOMY  1954    Social History:  reports that she quit smoking about 45 years ago. Her smoking use included cigarettes. She has never used smokeless tobacco. She reports current alcohol use. She reports that she does not use drugs.  Allergies: Allergies  Allergen Reactions   Augmentin  [Amoxicillin -Pot Clavulanate] Itching and Swelling    Headache    Meloxicam  Other (See Comments)    Stomach upset   Cephalexin Diarrhea   Codeine Sulfate     REACTION: nausea, vomiting   Fluconazole Itching and Swelling    REACTION: swelling and itching   Morphine     REACTION: nausea, itching   Oxycodone  Nausea Only   Sulfonamide Derivatives     Other Reaction(s): Unknown   Amoxicillin  Rash   Cyclobenzaprine      Per patient makes her "very groggy"-unlike usual effect   Other Swelling    Pt states shes also allergic to an antibiotic but does not know the name or have her list with her today, this antibiotic causes face swelling.    Family History:  Family History  Problem Relation Age of Onset   Diabetes Mother    Hypertension Mother    Cancer Mother        ANGIOSARCOMA   COPD Father    Thyroid  disease Sister    Scoliosis Sister      Current Outpatient Medications:    acetaminophen  (TYLENOL ) 500 MG tablet, Take 1 tablet (500 mg total) by mouth every 6 (six) hours as needed for mild pain or moderate pain., Disp: 30 tablet, Rfl: 0   ALPRAZolam  (XANAX ) 0.25 MG tablet, TAKE ONE TABLET BY MOUTH AT BEDTIME AS NEEDED FOR ANXIETY, Disp: 30 tablet, Rfl: 0   atorvastatin  (LIPITOR) 10 MG tablet, TAKE ONE TABLET BY MOUTH DAILY, Disp: 90 tablet, Rfl: 1   benzonatate  (TESSALON ) 100 MG capsule, Take 1 capsule (  100 mg total) by mouth 2 (two) times daily as needed for cough., Disp: 20 capsule, Rfl: 0   Calcium  Carb-Cholecalciferol (CALCIUM  500 + D) 500-3.125 MG-MCG TABS, Take by mouth., Disp: , Rfl:    cetirizine (ZYRTEC) 10 MG tablet, Take 10 mg by mouth daily as needed., Disp: , Rfl:    cholecalciferol (VITAMIN D3) 25 MCG (1000 UNIT) tablet, Take 1,000 Units by mouth daily., Disp: , Rfl:    citalopram  (CELEXA ) 20 MG tablet, TAKE ONE TABLET BY MOUTH EVERY DAY, Disp: 90 tablet, Rfl: 3   diphenhydrAMINE  HCl (BENADRYL  PO), Benadryl , Disp: , Rfl:    HYDROcodone  bit-homatropine (HYCODAN) 5-1.5 MG/5ML syrup, Take 5 mLs by mouth every 8 (eight) hours as needed  for cough., Disp: 120 mL, Rfl: 0   hyoscyamine  (LEVBID) 0.375 MG 12 hr tablet, Take 0.375 mg by mouth 2 (two) times daily., Disp: , Rfl:    hyoscyamine  (LEVSIN) 0.125 MG tablet, Take 0.125 mg by mouth every 4 (four) hours as needed., Disp: , Rfl:    Lactobacillus (ACIDOPHILUS PO), Acidophilus, Disp: , Rfl:    levothyroxine  (SYNTHROID ) 25 MCG tablet, Take 1 tablet (25 mcg total) by mouth daily., Disp: 90 tablet, Rfl: 1   Propylene Glycol (SYSTANE BALANCE) 0.6 % SOLN, 1 drop Once Daily., Disp: , Rfl:    valACYclovir  (VALTREX ) 500 MG tablet, Take 1 tablet (500 mg total) by mouth 2 (two) times daily as needed., Disp: 30 tablet, Rfl: 1  Review of Systems:  Negative unless indicated in HPI.   Physical Exam: Vitals:   03/03/23 1113  BP: 130/84  Pulse: 70  Temp: 98.3 F (36.8 C)  TempSrc: Oral  SpO2: 98%  Weight: 183 lb (83 kg)    Body mass index is 30.45 kg/m.   Physical Exam Vitals reviewed.  Constitutional:      Appearance: Normal appearance.  HENT:     Right Ear: Tympanic membrane, ear canal and external ear normal.     Left Ear: Tympanic membrane, ear canal and external ear normal.     Mouth/Throat:     Mouth: Mucous membranes are moist.     Pharynx: Oropharynx is clear.  Eyes:     Conjunctiva/sclera: Conjunctivae normal.     Pupils: Pupils are equal, round, and reactive to light.  Cardiovascular:     Rate and Rhythm: Normal rate and regular rhythm.  Pulmonary:     Effort: Pulmonary effort is normal.     Breath sounds: Normal breath sounds.  Neurological:     Mental Status: She is alert.      Impression and Plan:  IGT (impaired glucose tolerance) -     POCT glycosylated hemoglobin (Hb A1C)  Hyperlipidemia, unspecified hyperlipidemia type  Acquired hypothyroidism  Viral URI with cough -     HYDROcodone  Bit-Homatrop MBr; Take 5 mLs by mouth every 8 (eight) hours as needed for cough.  Dispense: 120 mL; Refill: 0  Left arm weakness   -A1c is stable to  slightly improved at 5.8.  Continue to work on lifestyle changes. -Last LDL was at goal at 80. -Hycodan cough syrup will be prescribed.  Given normal lung auscultation believe no further treatment is needed than what was provided at urgent care. -Given her transient left arm weakness occurred over 4 weeks ago I believe no further workup is needed at this time other than controlling risk factors.  I have however advised that if this happens in the future I would recommend her to call 911 for more  urgent stroke rule out.   Time spent:32 minutes reviewing chart, interviewing and examining patient and formulating plan of care.     Marguerita Shih, MD [facility]

## 2023-03-03 NOTE — Telephone Encounter (Signed)
 Spoke to patient and informed her that she does not have to fast.

## 2023-03-11 ENCOUNTER — Ambulatory Visit: Payer: Self-pay | Admitting: Internal Medicine

## 2023-03-11 NOTE — Telephone Encounter (Signed)
Patient has scheduled an appointment 

## 2023-03-11 NOTE — Telephone Encounter (Signed)
Copied from CRM [id number]. Topic: Clinical - Red Word Triage >> Mar 11, 2023 12:29 PM Corin V wrote: [facility] that prompted transfer to Nurse Triage: Patient saw Dr. Loreta Ave 03/03/23 for a cough. Urgent care diagnosed her with bronchitis on 02/28/23. She is still having trouble with coughing. Her torso is bruised just above her waist. One is brown another is pink. Located on the left side of torso. They also look swollen. She tried to put ice on them to help with swelling and it "burned". Her side hurts when she coughs.   Chief Complaint: 2 abdominal bruises Symptoms: 10/10 pain with coughing Frequency: pain 3-4 days; bruising noticed today Pertinent Negatives: Patient denies shortness of breath Disposition: [x] ED /[] Urgent Care (no appt availability in office) / [] Appointment(In office/virtual)/ []  [facility]/ [] Home Care/ [] Refused Recommended Disposition /[] [facility]/ []  Follow-up with PCP Additional Notes:  The patient reported two bruises on the left side of her abdomen causing 10/10 pain when she coughs.  She reported the bruises are larger than a silver dollar.  She denied anything striking her abdomen.  The pain has been ongoing for 3-4 days and she just noticed the bruises today.  She said her cough has been ongoing since 02/22/23 and cough syrup with hydrocodone was prescribed but it has been unhelpful.  She was advised to go to the ED due to the size of the bruises and that she has not report an external injury causing the bruises.  She declined stating that some people have to wait in the ED for up to 5 hours and she would prefer seeing Dr. Loreta Ave first.  Called the CAL line to notify of her refusal.  Nelva Bush stated that she would notify the nurse.    Reason for Disposition  Large bruise of abdominal wall > 2 inches (5 cm)  Answer Assessment - Initial Assessment Questions 1. MECHANISM: "How did the injury happen?"      Unsure  2. ONSET: "When did the injury happen?"  (Minutes or hours ago)     Has been hurting for 3-4 days but just noticed bruise today  3. LOCATION: "What part of the abdomen is injured?"     Left side feels swollen  4. APPEARANCE of INJURY: "What does the injury look like?"     2 bruises one is red and the other is dark red and purplish  5. PAIN: "Is there any pain? If Yes, ask: "How bad is the pain?"  (e.g., Scale 1-10; mild, moderate, or severe)   - MILD (1-3): doesn't interfere with normal activities, abdomen soft and not tender to touch    - MODERATE (4-7): interferes with normal activities or awakens from sleep, abdomen tender to touch    - SEVERE (8-10): excruciating pain, doubled over, unable to do any normal activities      1-2/10 sitting still  Closer to a 10 when coughing  6. SIZE: For cuts, bruises, or swelling, ask: "How large is it?" (e.g., inches or centimeters)     Larger than a silver dollar  8. OTHER SYMPTOMS: "Do you have any other symptoms?"     Cough - clear phlegm; cough started 02/22/23 urgent care 02/27/23 Hydrocodone cough syrup prescribed but it hasn't helped  Protocols used: Abdominal Injury-A-AH

## 2023-03-12 ENCOUNTER — Ambulatory Visit (INDEPENDENT_AMBULATORY_CARE_PROVIDER_SITE_OTHER): Payer: Medicare Other | Admitting: Family Medicine

## 2023-03-12 ENCOUNTER — Ambulatory Visit (INDEPENDENT_AMBULATORY_CARE_PROVIDER_SITE_OTHER): Payer: Medicare Other

## 2023-03-12 ENCOUNTER — Encounter: Payer: Self-pay | Admitting: Family Medicine

## 2023-03-12 VITALS — BP 120/70 | HR 82 | Temp 97.9°F | Resp 16 | Ht 65.0 in | Wt 183.0 lb

## 2023-03-12 DIAGNOSIS — Z96611 Presence of right artificial shoulder joint: Secondary | ICD-10-CM | POA: Diagnosis not present

## 2023-03-12 DIAGNOSIS — R58 Hemorrhage, not elsewhere classified: Secondary | ICD-10-CM | POA: Diagnosis not present

## 2023-03-12 DIAGNOSIS — R053 Chronic cough: Secondary | ICD-10-CM | POA: Diagnosis not present

## 2023-03-12 NOTE — Patient Instructions (Addendum)
A few things to remember from today's visit:  Ecchymosis - Plan: CBC with Differential/Platelet  Persistent cough - Plan: CBC with Differential/Platelet, DG Chest 2 View Try Flonase nasal spray at night for 10-14 days. Plain Mucinex may help. No changes in cough meds. Monitor for new symptoms.  If you need refills for medications you take chronically, please call your pharmacy. Do not use My Chart to request refills or for acute issues that need immediate attention. If you send a my chart message, it may take a few days to be addressed, specially if I am not in the office.  Please be sure medication list is accurate. If a new problem present, please set up appointment sooner than planned today.

## 2023-03-12 NOTE — Progress Notes (Unsigned)
ACUTE VISIT Chief Complaint  Patient presents with   Cough   bruising    On left side, noticed yesterday    HPI: Brianna Mack is a 78 y.o. female with a PMHx significant for IBS, GERD, IGT, hypothyroidism, vitamin D deficiency, HLD, anxiety, and depression, who is here today complaining of cough with bruising.   Cough:  Patient complains of constant cough since 1/7. The cough is occasionally productive. She went to UC on 1/11 and tested negative for covid, flu, and RSV.  She also endorses some rhinorrhea, hoarseness, and ear pain when coughing.  Cough This is a new problem. The current episode started 1 to 4 weeks ago. The problem has been gradually improving. Associated symptoms include postnasal drip. Pertinent negatives include no chest pain, chills, ear congestion, fever, headaches, myalgias, rash, rhinorrhea, sore throat, sweats or weight loss. Nothing aggravates the symptoms. She has tried prescription cough suppressant for the symptoms. The treatment provided mild relief. Her past medical history is significant for environmental allergies. There is no history of asthma, COPD or emphysema.   She mentions she vomited a few times near the onset of her cough, but has not vomited for several days.  She has been taking hydocan 5 mL's every 8 hours prn.  Has not been taking benzonatate, it did not help in the past.  She has a hx of seasonal allergies, for which she takes zyrtec 10 mg daily prn.  No hx of asthma.  Pertinent negatives include wheezing, SOB, heartburn, nausea, or difficulty swallowing.   Ecchymosis:  Concerned about 2 large bruises on the left side of her abdomen. She noticed them yesterday, but acknowledges they could have appeared earlier and not been noticed because she has been in bed for most of the past couple days due to her cough.  No hx of blunt trauma. Sometimes  burning like sensation on affected areas, and occasionally hurt when she coughs a lot.  Denies  nosebleed, gum bleed, blood in urine, blood in stool, or black stool.   Review of Systems  Constitutional:  Negative for chills, diaphoresis, fever, unexpected weight change and weight loss.  HENT:  Positive for postnasal drip. Negative for rhinorrhea and sore throat.   Respiratory:  Positive for cough.   Cardiovascular:  Negative for chest pain, palpitations and leg swelling.  Gastrointestinal:  Negative for abdominal pain.  Endocrine: Negative for cold intolerance and heat intolerance.  Genitourinary:  Negative for decreased urine volume, dysuria and hematuria.  Musculoskeletal:  Negative for gait problem and myalgias.  Skin:  Negative for rash.  Allergic/Immunologic: Positive for environmental allergies.  Neurological:  Negative for syncope, facial asymmetry and headaches.  Hematological:  Negative for adenopathy. Does not bruise/bleed easily.  See other pertinent positives and negatives in HPI.  Current Outpatient Medications on File Prior to Visit  Medication Sig Dispense Refill   acetaminophen (TYLENOL) 500 MG tablet Take 1 tablet (500 mg total) by mouth every 6 (six) hours as needed for mild pain or moderate pain. 30 tablet 0   ALPRAZolam (XANAX) 0.25 MG tablet TAKE ONE TABLET BY MOUTH AT BEDTIME AS NEEDED FOR ANXIETY 30 tablet 0   atorvastatin (LIPITOR) 10 MG tablet TAKE ONE TABLET BY MOUTH DAILY 90 tablet 1   benzonatate (TESSALON) 100 MG capsule Take 1 capsule (100 mg total) by mouth 2 (two) times daily as needed for cough. 20 capsule 0   Calcium Carb-Cholecalciferol (CALCIUM 500 + D) 500-3.125 MG-MCG TABS Take by mouth.  cetirizine (ZYRTEC) 10 MG tablet Take 10 mg by mouth daily as needed.     cholecalciferol (VITAMIN D3) 25 MCG (1000 UNIT) tablet Take 1,000 Units by mouth daily.     citalopram (CELEXA) 20 MG tablet TAKE ONE TABLET BY MOUTH EVERY DAY 90 tablet 3   diphenhydrAMINE HCl (BENADRYL PO) Benadryl     HYDROcodone bit-homatropine (HYCODAN) 5-1.5 MG/5ML syrup Take 5  mLs by mouth every 8 (eight) hours as needed for cough. 120 mL 0   hyoscyamine (LEVBID) 0.375 MG 12 hr tablet Take 0.375 mg by mouth 2 (two) times daily.     hyoscyamine (LEVSIN) 0.125 MG tablet Take 0.125 mg by mouth every 4 (four) hours as needed.     Lactobacillus (ACIDOPHILUS PO) Acidophilus     levothyroxine (SYNTHROID) 25 MCG tablet Take 1 tablet (25 mcg total) by mouth daily. 90 tablet 1   Propylene Glycol (SYSTANE BALANCE) 0.6 % SOLN 1 drop Once Daily.     valACYclovir (VALTREX) 500 MG tablet Take 1 tablet (500 mg total) by mouth 2 (two) times daily as needed. 30 tablet 1   No current facility-administered medications on file prior to visit.    Past Medical History:  Diagnosis Date   Anxiety    Arthritis    Depression    HSV (herpes simplex virus) anogenital infection 11/2015   IBS (irritable bowel syndrome)    Pemphigus vulgaris    Stress fracture    Allergies  Allergen Reactions   Augmentin [Amoxicillin-Pot Clavulanate] Itching and Swelling    Headache   Meloxicam Other (See Comments)    Stomach upset   Cephalexin Diarrhea   Codeine Sulfate     REACTION: nausea, vomiting   Fluconazole Itching and Swelling    REACTION: swelling and itching   Morphine     REACTION: nausea, itching   Oxycodone Nausea Only   Sulfonamide Derivatives     Other Reaction(s): Unknown   Amoxicillin Rash   Cyclobenzaprine     Per patient makes her "very groggy"-unlike usual effect   Other Swelling    Pt states shes also allergic to an antibiotic but does not know the name or have her list with her today, this antibiotic causes face swelling.    Social History   Socioeconomic History   Marital status: Widowed    Spouse name: Not on file   Number of children: Not on file   Years of education: Not on file   Highest education level: Associate degree: occupational, Scientist, product/process development, or vocational program  Occupational History   Not on file  Tobacco Use   Smoking status: Former    Current  packs/day: 0.00    Types: Cigarettes    Quit date: 08/03/1977    Years since quitting: 45.6   Smokeless tobacco: Never  Vaping Use   Vaping status: Never Used  Substance and Sexual Activity   Alcohol use: Yes    Alcohol/week: 0.0 standard drinks of alcohol    Comment: occasionally   Drug use: No   Sexual activity: Not Currently    Birth control/protection: Surgical    Comment: Hyst; First IC >16 y/o, <5 Partners, HSV 2+  Other Topics Concern   Not on file  Social History Narrative   Not on file   Social Drivers of Health   Financial Resource Strain: Low Risk  (03/11/2023)   Overall Financial Resource Strain (CARDIA)    Difficulty of Paying Living Expenses: Not hard at all  Food Insecurity: No Food Insecurity (  03/11/2023)   Hunger Vital Sign    Worried About Running Out of Food in the Last Year: Never true    Ran Out of Food in the Last Year: Never true  Transportation Needs: No Transportation Needs (03/11/2023)   PRAPARE - Administrator, Civil Service (Medical): No    Lack of Transportation (Non-Medical): No  Physical Activity: Inactive (03/11/2023)   Exercise Vital Sign    Days of Exercise per Week: 0 days    Minutes of Exercise per Session: 20 min  Stress: No Stress Concern Present (03/11/2023)   [employer] of Occupational Health - Occupational Stress Questionnaire    Feeling of Stress : Only a little  Social Connections: Moderately Integrated (03/11/2023)   Social Connection and Isolation Panel [NHANES]    Frequency of Communication with Friends and Family: More than three times a week    Frequency of Social Gatherings with Friends and Family: Twice a week    Attends Religious Services: 1 to 4 times per year    Active Member of [employer] or Organizations: Yes    Attends Banker Meetings: More than 4 times per year    Marital Status: Widowed   Today's Vitals   03/12/23 0858  BP: 120/70  Pulse: 82  Resp: 16  Temp: 97.9 F (36.6 C)   TempSrc: Oral  SpO2: 96%  Weight: 183 lb (83 kg)  Height: 5\' 5"  (1.651 m)   Body mass index is 30.45 kg/m.  Physical Exam Vitals and nursing note reviewed.  Constitutional:      General: She is not in acute distress.    Appearance: She is well-developed.  HENT:     Head: Normocephalic and atraumatic.     Right Ear: Tympanic membrane, ear canal and external ear normal.     Left Ear: Tympanic membrane, ear canal and external ear normal.     Nose: Septal deviation and rhinorrhea present.     Mouth/Throat:     Mouth: Mucous membranes are moist.     Pharynx: Uvula midline. Postnasal drip present.  Eyes:     Conjunctiva/sclera: Conjunctivae normal.  Cardiovascular:     Rate and Rhythm: Normal rate and regular rhythm.     Heart sounds: No murmur heard. Pulmonary:     Effort: Pulmonary effort is normal. No respiratory distress.     Breath sounds: Normal breath sounds.  Lymphadenopathy:     Cervical: No cervical adenopathy.  Skin:    General: Skin is warm.     Findings: Bruising present. No erythema or rash.       Neurological:     General: No focal deficit present.     Mental Status: She is alert and oriented to person, place, and time.     Gait: Gait normal.  Psychiatric:        Mood and Affect: Affect normal. Mood is anxious.   ASSESSMENT AND PLAN:  Ms. Whalin was seen today for cough with bruising.   Ecchymosis We discussed possible etiologies. Hx and examination do not suggest a serious illness. Ecchymosis are resolving. Monitor for new symptoms. Further recommendations according to lab result.  -     CBC with Differential/Platelet; Future  Persistent cough We discussed possible causes, most likely post viral, could also be aggravated by some of her chronic comorbilities like GERD or allergies. Lung auscultation negative. Recommend trying Benzonatate and continue Hycodan. Adequate hydration. Plain Mucinex may also help. Instructed about warning  signs. Further recommendations according  to CXR result.  -     CBC with Differential/Platelet; Future -     DG Chest 2 View; Future  Return if symptoms worsen or fail to improve.  I, Rolla Etienne Wierda, acting as a scribe for Dominyck Reser [place], MD., have documented all relevant documentation on the behalf of Dianne Whelchel [place], MD, as directed by  Shaunte Tuft [place], MD while in the presence of Shalayah Beagley [place], MD.   I, Tiphany Fayson [place], MD, have reviewed all documentation for this visit. The documentation on 03/12/23 for the exam, diagnosis, procedures, and orders are all accurate and complete.  Shahla Betsill G. [place], MD  [facility]. [place] office.

## 2023-03-17 ENCOUNTER — Encounter: Payer: Self-pay | Admitting: Family Medicine

## 2023-03-19 ENCOUNTER — Other Ambulatory Visit: Payer: Self-pay | Admitting: Internal Medicine

## 2023-03-19 DIAGNOSIS — J069 Acute upper respiratory infection, unspecified: Secondary | ICD-10-CM

## 2023-03-19 DIAGNOSIS — J40 Bronchitis, not specified as acute or chronic: Secondary | ICD-10-CM | POA: Diagnosis not present

## 2023-03-19 DIAGNOSIS — R059 Cough, unspecified: Secondary | ICD-10-CM | POA: Diagnosis not present

## 2023-03-19 NOTE — Telephone Encounter (Signed)
Copied from CRM [id number]. Topic: Clinical - Medication Refill >> Mar 19, 2023 11:13 AM Louie Boston wrote: Most Recent Primary Care Visit:  Provider: [place], BETTY G  Department: [facility]  Visit Type: OFFICE VISIT  Date: 03/12/2023  Medication: HYDROcodone bit-homatropine (HYCODAN) 5-1.5 MG/5ML syrup  Has the patient contacted their pharmacy? Yes (Agent: If no, request that the patient contact the pharmacy for the refill. If patient does not wish to contact the pharmacy document the reason why and proceed with request.) (Agent: If yes, when and what did the pharmacy advise?)  Patient was advised by pharmacy there were no refills, and because the medication is a controlled substance, patient needed to contact provider.   Is this the correct pharmacy for this prescription? Yes If no, delete pharmacy and type the correct one.  This is the patient's preferred pharmacy:  [facility] [place], [place] - [address] [place] [address] Phone: [phone] Fax: [phone]   Has the prescription been filled recently? Yes  Is the patient out of the medication? Yes  Has the patient been seen for an appointment in the last year OR does the patient have an upcoming appointment? Yes  Can we respond through MyChart? Yes  Agent: Please be advised that Rx refills may take up to 3 business days. We ask that you follow-up with your pharmacy.

## 2023-03-22 MED ORDER — HYDROCODONE BIT-HOMATROP MBR 5-1.5 MG/5ML PO SOLN: 5.0000 mL | Freq: Three times a day (TID) | ORAL | 0 refills | Status: DC | PRN

## 2023-04-08 DIAGNOSIS — Z961 Presence of intraocular lens: Secondary | ICD-10-CM | POA: Diagnosis not present

## 2023-04-08 DIAGNOSIS — H26491 Other secondary cataract, right eye: Secondary | ICD-10-CM | POA: Diagnosis not present

## 2023-04-20 DIAGNOSIS — L820 Inflamed seborrheic keratosis: Secondary | ICD-10-CM | POA: Diagnosis not present

## 2023-04-20 DIAGNOSIS — L821 Other seborrheic keratosis: Secondary | ICD-10-CM | POA: Diagnosis not present

## 2023-04-22 ENCOUNTER — Other Ambulatory Visit: Payer: Self-pay | Admitting: Internal Medicine

## 2023-04-22 DIAGNOSIS — E785 Hyperlipidemia, unspecified: Secondary | ICD-10-CM

## 2023-05-05 DIAGNOSIS — E039 Hypothyroidism, unspecified: Secondary | ICD-10-CM | POA: Diagnosis not present

## 2023-05-05 DIAGNOSIS — R7303 Prediabetes: Secondary | ICD-10-CM | POA: Diagnosis not present

## 2023-05-12 DIAGNOSIS — R7303 Prediabetes: Secondary | ICD-10-CM | POA: Diagnosis not present

## 2023-05-12 DIAGNOSIS — E039 Hypothyroidism, unspecified: Secondary | ICD-10-CM | POA: Diagnosis not present

## 2023-05-26 DIAGNOSIS — D485 Neoplasm of uncertain behavior of skin: Secondary | ICD-10-CM | POA: Diagnosis not present

## 2023-05-26 DIAGNOSIS — L820 Inflamed seborrheic keratosis: Secondary | ICD-10-CM | POA: Diagnosis not present

## 2023-05-26 DIAGNOSIS — L578 Other skin changes due to chronic exposure to nonionizing radiation: Secondary | ICD-10-CM | POA: Diagnosis not present

## 2023-05-26 DIAGNOSIS — L814 Other melanin hyperpigmentation: Secondary | ICD-10-CM | POA: Diagnosis not present

## 2023-05-26 DIAGNOSIS — L821 Other seborrheic keratosis: Secondary | ICD-10-CM | POA: Diagnosis not present

## 2023-05-26 DIAGNOSIS — D1801 Hemangioma of skin and subcutaneous tissue: Secondary | ICD-10-CM | POA: Diagnosis not present

## 2023-06-23 DIAGNOSIS — E039 Hypothyroidism, unspecified: Secondary | ICD-10-CM | POA: Diagnosis not present

## 2023-07-18 ENCOUNTER — Other Ambulatory Visit: Payer: Self-pay | Admitting: Nurse Practitioner

## 2023-07-18 DIAGNOSIS — F341 Dysthymic disorder: Secondary | ICD-10-CM

## 2023-07-20 NOTE — Telephone Encounter (Signed)
 Med refill request: Celexa  Last AEX: 06/03/22 TW Next AEX: not scheduled, will sent message to front desk to schedule patient for annual visit Last MMG (if hormonal med) 12/16/22 Refill authorized: Last Rx sent 90 with 3 refills on 07/22/22 TW. Please approve or deny

## 2023-07-21 ENCOUNTER — Other Ambulatory Visit: Payer: Self-pay

## 2023-07-21 DIAGNOSIS — F341 Dysthymic disorder: Secondary | ICD-10-CM

## 2023-07-21 NOTE — Telephone Encounter (Signed)
 Med refill request: citalopram  20 mg Patient LM on RX line. Last AEX: 06/03/22 Next AEX: none scheduled, B&P due 05/2024 Last MMG (if hormonal med) n/a Refill authorized: Please approve or deny as appropriate.  Patient notified RF request sent to TW.

## 2023-07-22 MED ORDER — CITALOPRAM HYDROBROMIDE 20 MG PO TABS: 20.0000 mg | ORAL_TABLET | Freq: Every day | ORAL | 0 refills | Status: DC

## 2023-07-28 ENCOUNTER — Ambulatory Visit: Payer: Self-pay | Admitting: *Deleted

## 2023-07-28 ENCOUNTER — Ambulatory Visit (INDEPENDENT_AMBULATORY_CARE_PROVIDER_SITE_OTHER): Admitting: Family Medicine

## 2023-07-28 ENCOUNTER — Encounter: Payer: Self-pay | Admitting: Family Medicine

## 2023-07-28 VITALS — BP 126/70 | HR 85 | Resp 16 | Ht 65.0 in | Wt 182.0 lb

## 2023-07-28 DIAGNOSIS — R102 Pelvic and perineal pain: Secondary | ICD-10-CM | POA: Diagnosis not present

## 2023-07-28 DIAGNOSIS — R300 Dysuria: Secondary | ICD-10-CM | POA: Diagnosis not present

## 2023-07-28 DIAGNOSIS — N390 Urinary tract infection, site not specified: Secondary | ICD-10-CM

## 2023-07-28 LAB — POC URINALSYSI DIPSTICK (AUTOMATED)
Bilirubin, UA: NEGATIVE
Blood, UA: POSITIVE
Glucose, UA: NEGATIVE
Ketones, UA: NEGATIVE
Nitrite, UA: NEGATIVE
Protein, UA: POSITIVE — AB
Spec Grav, UA: 1.02 (ref 1.010–1.025)
Urobilinogen, UA: 0.2 U/dL
pH, UA: 6 (ref 5.0–8.0)

## 2023-07-28 MED ORDER — NITROFURANTOIN MONOHYD MACRO 100 MG PO CAPS: 100.0000 mg | ORAL_CAPSULE | Freq: Two times a day (BID) | ORAL | 0 refills | Status: AC

## 2023-07-28 NOTE — Telephone Encounter (Signed)
  FYI Only or Action Required?: Action required by provider  Patient was last seen in primary care on 03/12/2023 by [place], Betty G, MD. Called Nurse Triage reporting Urinary Frequency. Symptoms began today. Interventions attempted: Nothing. Symptoms are: gradually worsening.  Triage Disposition: See Physician Within 24 Hours  Patient/caregiver understands and will follow disposition?: Yes                    Copied from CRM [id number]. Topic: Clinical - Red Word Triage >> Jul 28, 2023  1:38 PM Shereese L wrote: [facility] that prompted transfer to Nurse Triage: slight pressure, irritation and itching in vaginal area Reason for Disposition  Urinating more frequently than usual (i.e., frequency)  Answer Assessment - Initial Assessment Questions 1. SYMPTOM: What's the main symptom you're concerned about? (e.g., frequency, incontinence)     Frequency , pressure  2. ONSET: When did the  sx   start?     Today  3. PAIN: Is there any pain? If Yes, ask: How bad is it? (Scale: 1-10; mild, moderate, severe)     More pressure  4. CAUSE: What do you think is causing the symptoms?     Pressure low abdomen  5. OTHER SYMPTOMS: Do you have any other symptoms? (e.g., blood in urine, fever, flank pain, pain with urination)     Burning with urination , frequency and pressure, vaginal irritation.  6. PREGNANCY: Is there any chance you are pregnant? When was your last menstrual period?     Na  No available appt with PCP until July. Scheduled with other provider today. Patient will be going out of town soon and requesting appt today .  Protocols used: Urinary Symptoms-A-AH

## 2023-07-28 NOTE — Patient Instructions (Addendum)
 A few things to remember from today's visit:  Dysuria - Plan: POCT Urinalysis Dipstick (Automated), Culture, Urine, Culture, Urine  Urinary tract infection without hematuria, site unspecified  Suprapubic abdominal pain   Adequate fluid intake, avoid holding urine for long hours, and over the counter Vit C OR cranberry capsules might help.  Today we will treat empirically with antibiotic, which we might need to change when urine culture comes back depending of bacteria susceptibility.  Seek immediate medical attention if severe abdominal pain, vomiting, fever/chills, or worsening symptoms. F/U if symptomatic are not any better after 2-3 days of antibiotic treatment.  Do not use My Chart to request refills or for acute issues that need immediate attention. If you send a my chart message, it may take a few days to be addressed, specially if I am not in the office.  Please be sure medication list is accurate. If a new problem present, please set up appointment sooner than planned today.

## 2023-07-28 NOTE — Progress Notes (Signed)
 ACUTE VISIT Chief Complaint  Patient presents with   Urinary Tract Infection    Started today, going out of town Saturday. Having slight burning, but main symptom is the pressure.    HPI: Brianna Mack is a 78 y.o. female with a PMHx significant for IBS, GERD, IGT, hypothyroidism, vitamin D  deficiency, HLD, anxiety, and depression, who is here today complaining of UTI symptoms as described above.   Patient complains of mild dysuria, suprapubic presssure, and frequency that began today.  Urinary Tract Infection  This is a new problem. The current episode started today. The problem occurs every urination. The problem has been unchanged. The quality of the pain is described as burning. The pain is at a severity of 6/10. The pain is moderate. There has been no fever. There is No history of pyelonephritis. Associated symptoms include frequency. Pertinent negatives include no chills, discharge, flank pain, hematuria, hesitancy, nausea, sweats, urgency or vomiting. She has tried nothing for the symptoms. There is no history of a urological procedure.   She rates the pressure as a 6/10 and says it is constant.  She has not taken anything OTC before.  Pertinent negatives include hematuria, urinary urgency, incontinence, fever, chills, appetite changes, nausea, vomiting, or bowel changes (had a bowel movement today).   Review of Systems  Constitutional:  Negative for chills.  Respiratory:  Negative for shortness of breath.   Cardiovascular:  Negative for leg swelling.  Gastrointestinal:  Negative for blood in stool, nausea and vomiting.  Genitourinary:  Positive for frequency. Negative for flank pain, hematuria, hesitancy and urgency.  Skin:  Negative for rash.  Neurological:  Negative for syncope and weakness.  Psychiatric/Behavioral:  Negative for confusion and hallucinations.   See other pertinent positives and negatives in HPI.  Current Outpatient Medications on File Prior to Visit   Medication Sig Dispense Refill   acetaminophen  (TYLENOL ) 500 MG tablet Take 1 tablet (500 mg total) by mouth every 6 (six) hours as needed for mild pain or moderate pain. 30 tablet 0   ALPRAZolam  (XANAX ) 0.25 MG tablet TAKE ONE TABLET BY MOUTH AT BEDTIME AS NEEDED FOR ANXIETY 30 tablet 0   atorvastatin  (LIPITOR) 10 MG tablet TAKE ONE TABLET BY MOUTH DAILY 90 tablet 1   benzonatate  (TESSALON ) 100 MG capsule Take 1 capsule (100 mg total) by mouth 2 (two) times daily as needed for cough. 20 capsule 0   Calcium  Carb-Cholecalciferol (CALCIUM  500 + D) 500-3.125 MG-MCG TABS Take by mouth.     cetirizine (ZYRTEC) 10 MG tablet Take 10 mg by mouth daily as needed.     cholecalciferol (VITAMIN D3) 25 MCG (1000 UNIT) tablet Take 1,000 Units by mouth daily.     citalopram  (CELEXA ) 20 MG tablet Take 1 tablet (20 mg total) by mouth daily. 90 tablet 0   diphenhydrAMINE  HCl (BENADRYL  PO) Benadryl      HYDROcodone  bit-homatropine (HYCODAN) 5-1.5 MG/5ML syrup Take 5 mLs by mouth every 8 (eight) hours as needed for cough. 120 mL 0   hyoscyamine  (LEVBID) 0.375 MG 12 hr tablet Take 0.375 mg by mouth 2 (two) times daily.     hyoscyamine  (LEVSIN) 0.125 MG tablet Take 0.125 mg by mouth every 4 (four) hours as needed.     Lactobacillus (ACIDOPHILUS PO) Acidophilus     levothyroxine  (SYNTHROID ) 25 MCG tablet Take 1 tablet (25 mcg total) by mouth daily. 90 tablet 1   Propylene Glycol (SYSTANE BALANCE) 0.6 % SOLN 1 drop Once Daily.  valACYclovir  (VALTREX ) 500 MG tablet Take 1 tablet (500 mg total) by mouth 2 (two) times daily as needed. 30 tablet 1   No current facility-administered medications on file prior to visit.    Past Medical History:  Diagnosis Date   Anxiety    Arthritis    Depression    HSV (herpes simplex virus) anogenital infection 11/2015   IBS (irritable bowel syndrome)    Pemphigus vulgaris    Stress fracture    Allergies  Allergen Reactions   Augmentin  [Amoxicillin -Pot Clavulanate] Itching  and Swelling    Headache   Meloxicam  Other (See Comments)    Stomach upset   Cephalexin Diarrhea   Codeine Sulfate     REACTION: nausea, vomiting   Fluconazole Itching and Swelling    REACTION: swelling and itching   Morphine     REACTION: nausea, itching   Oxycodone  Nausea Only   Sulfonamide Derivatives     Other Reaction(s): Unknown   Amoxicillin  Rash   Cyclobenzaprine      Per patient makes her very groggy-unlike usual effect   Other Swelling    Pt states shes also allergic to an antibiotic but does not know the name or have her list with her today, this antibiotic causes face swelling.    Social History   Socioeconomic History   Marital status: Widowed    Spouse name: Not on file   Number of children: Not on file   Years of education: Not on file   Highest education level: Associate degree: occupational, Scientist, product/process development, or vocational program  Occupational History   Not on file  Tobacco Use   Smoking status: Former    Current packs/day: 0.00    Types: Cigarettes    Quit date: 08/03/1977    Years since quitting: 46.0   Smokeless tobacco: Never  Vaping Use   Vaping status: Never Used  Substance and Sexual Activity   Alcohol use: Yes    Alcohol/week: 0.0 standard drinks of alcohol    Comment: occasionally   Drug use: No   Sexual activity: Not Currently    Birth control/protection: Surgical    Comment: Hyst; First IC >16 y/o, <5 Partners, HSV 2+  Other Topics Concern   Not on file  Social History Narrative   Not on file   Social Drivers of Health   Financial Resource Strain: Low Risk  (03/11/2023)   Overall Financial Resource Strain (CARDIA)    Difficulty of Paying Living Expenses: Not hard at all  Food Insecurity: No Food Insecurity (03/11/2023)   Hunger Vital Sign    Worried About Running Out of Food in the Last Year: Never true    Ran Out of Food in the Last Year: Never true  Transportation Needs: No Transportation Needs (03/11/2023)   PRAPARE - Therapist, art (Medical): No    Lack of Transportation (Non-Medical): No  Physical Activity: Inactive (03/11/2023)   Exercise Vital Sign    Days of Exercise per Week: 0 days    Minutes of Exercise per Session: 20 min  Stress: No Stress Concern Present (03/11/2023)   [employer] of Occupational Health - Occupational Stress Questionnaire    Feeling of Stress : Only a little  Social Connections: Moderately Integrated (03/11/2023)   Social Connection and Isolation Panel [NHANES]    Frequency of Communication with Friends and Family: More than three times a week    Frequency of Social Gatherings with Friends and Family: Twice a week  Attends Religious Services: 1 to 4 times per year    Active Member of Clubs or Organizations: Yes    Attends Banker Meetings: More than 4 times per year    Marital Status: Widowed   Vitals:   07/28/23 1420  BP: 126/70  Pulse: 85  Resp: 16  SpO2: 98%   Body mass index is 30.29 kg/m.  Physical Exam Vitals and nursing note reviewed.  Constitutional:      General: She is not in acute distress.    Appearance: She is well-developed.  HENT:     Head: Normocephalic and atraumatic.  Eyes:     Conjunctiva/sclera: Conjunctivae normal.  Cardiovascular:     Rate and Rhythm: Normal rate and regular rhythm.     Pulses:          Dorsalis pedis pulses are 2+ on the right side and 2+ on the left side.  Pulmonary:     Effort: Pulmonary effort is normal. No respiratory distress.     Breath sounds: Normal breath sounds.  Abdominal:     Palpations: Abdomen is soft. There is no hepatomegaly or mass.     Tenderness: There is no abdominal tenderness. There is no right CVA tenderness or left CVA tenderness.  Musculoskeletal:     Right lower leg: No edema.     Left lower leg: No edema.  Skin:    General: Skin is warm.     Findings: No erythema.  Neurological:     Mental Status: She is alert and oriented to person, place, and time.      Gait: Gait normal.  Psychiatric:        Mood and Affect: Mood and affect normal.   ASSESSMENT AND PLAN:  Ms. Herbig was seen today for UTI symptoms.   Dysuria Symptoms started today. Urine dipstick here in the office positive for protein, blood, and 1+ leukocytes. Will send urine for culture.  -     POCT Urinalysis Dipstick (Automated) -     Urine Culture; Future  Urinary tract infection without hematuria, site unspecified A few antibiotic allergies (Augmentin , cephalexin, and sulfas), so empiric treatment with nitrofurantoin  100 mg twice daily for 5 days started today. Continue adequate hydration. OTC cranberry pills or vitamin C may help to acidify urine and therefore help with symptoms. Treatment will be tailored according to urine culture results and sensitivity. Clearly instructed about warning signs.  -     Nitrofurantoin  Monohyd Macro; Take 1 capsule (100 mg total) by mouth 2 (two) times daily for 5 days.  Dispense: 10 capsule; Refill: 0  Suprapubic abdominal pain Most likely related with above problem, we discussed other possible etiologies. Monitor for changes. Instructed about warning signs.  Return if symptoms worsen or fail to improve.  I, Fritz Jewel Wierda, acting as a scribe for Malikah Principato [place], MD., have documented all relevant documentation on the behalf of Brianna Barrett [place], MD, as directed by  Moriah Loughry [place], MD while in the presence of Verlaine Embry [place], MD.   I, Whitney Bingaman [place], MD, have reviewed all documentation for this visit. The documentation on 07/28/23 for the exam, diagnosis, procedures, and orders are all accurate and complete.  Aadin Gaut G. [place], MD  [facility]. [place] office.

## 2023-07-28 NOTE — Telephone Encounter (Signed)
 noted

## 2023-07-31 LAB — URINE CULTURE
MICRO NUMBER:: 16567301
SPECIMEN QUALITY:: ADEQUATE

## 2023-08-02 ENCOUNTER — Ambulatory Visit: Payer: Self-pay | Admitting: Family Medicine

## 2023-08-18 ENCOUNTER — Ambulatory Visit (INDEPENDENT_AMBULATORY_CARE_PROVIDER_SITE_OTHER): Admitting: Nurse Practitioner

## 2023-08-18 ENCOUNTER — Encounter: Payer: Self-pay | Admitting: Nurse Practitioner

## 2023-08-18 VITALS — BP 110/60 | HR 89 | Ht 62.0 in | Wt 179.2 lb

## 2023-08-18 DIAGNOSIS — B009 Herpesviral infection, unspecified: Secondary | ICD-10-CM | POA: Diagnosis not present

## 2023-08-18 DIAGNOSIS — F418 Other specified anxiety disorders: Secondary | ICD-10-CM | POA: Diagnosis not present

## 2023-08-18 MED ORDER — VALACYCLOVIR HCL 500 MG PO TABS: 500.0000 mg | ORAL_TABLET | Freq: Two times a day (BID) | ORAL | 1 refills | Status: AC | PRN

## 2023-08-18 MED ORDER — ALPRAZOLAM 0.25 MG PO TABS: 0.2500 mg | ORAL_TABLET | Freq: Every day | ORAL | 0 refills | Status: DC | PRN

## 2023-08-18 MED ORDER — CITALOPRAM HYDROBROMIDE 20 MG PO TABS
20.0000 mg | ORAL_TABLET | Freq: Every day | ORAL | 3 refills | Status: DC
Start: 2023-08-18 — End: 2023-12-22

## 2023-08-18 NOTE — Progress Notes (Signed)
   Brianna Mack Brianna Mack 12/24/1945 [id number]   History:  78 y.o. G0 presents for medication management. Takes 1/2 tablet 0.25 mg xanax  rarely, celexa  daily for management of anxiety and depression. Caring for sister who has chronic health issues and sometimes it can be overwhelming. H/O HSV, takes Valtrex  as needed.   Gynecologic History No LMP recorded. Patient has had a hysterectomy.   Contraception: status post hysterectomy Sexually active: No  Health Maintenance Last Pap: 10/24/2014. Results were: Normal Last mammogram: 12/16/2022. Results were: Normal Last colonoscopy: 04/20/2017. Results were: Normal, 10-year recall Last Dexa: 06/02/2022. Results were: Normal  Past medical history, past surgical history, family history and social history were all reviewed and documented in the EPIC chart. Widowed. Has great friends, travels with them. Brianna Mack has 62 yo daughter.   ROS:  A ROS was performed and pertinent positives and negatives are included.  Exam:  Vitals:   08/18/23 1058  BP: 110/60  Pulse: 89  SpO2: 99%  Weight: 179 lb 3.2 oz (81.3 kg)  Height: 5' 2 (1.575 m)     Body mass index is 32.78 kg/m.  General appearance:  Normal   Assessment/Plan:  78 y.o. G0 for medication management.   Anxiety with depression - Plan: ALPRAZolam  (XANAX ) 0.25 MG tablet as needed for anxiety. Takes rarely. Aware of addictive properties. Many times she will cut in half. Citalopram  (CELEXA ) 20 MG tablet daily. Doing well on this.   HSV (herpes simplex virus) infection - Plan: valACYclovir  (VALTREX ) 500 MG tablet BID x 3-5 days at first sign of outbreak.   Return in about 1 year (around 08/17/2024) for B&P.     Brianna Mack Brianna Shutter DNP, 11:33 AM 08/18/2023

## 2023-08-25 DIAGNOSIS — M67912 Unspecified disorder of synovium and tendon, left shoulder: Secondary | ICD-10-CM | POA: Diagnosis not present

## 2023-08-25 DIAGNOSIS — M25511 Pain in right shoulder: Secondary | ICD-10-CM | POA: Diagnosis not present

## 2023-09-03 ENCOUNTER — Encounter: Payer: Self-pay | Admitting: Advanced Practice Midwife

## 2023-09-07 DIAGNOSIS — M7542 Impingement syndrome of left shoulder: Secondary | ICD-10-CM | POA: Diagnosis not present

## 2023-09-13 ENCOUNTER — Telehealth: Payer: Self-pay | Admitting: Gastroenterology

## 2023-09-13 ENCOUNTER — Ambulatory Visit: Admitting: Internal Medicine

## 2023-09-13 DIAGNOSIS — R7302 Impaired glucose tolerance (oral): Secondary | ICD-10-CM

## 2023-09-13 NOTE — Telephone Encounter (Signed)
 She has had long-established IBS and followed with [facility] for years. Workup seems to have been reasonable. As it has been a year, and she would like to transition, I am willing for her to come to the [facility]. She can be seen by one of my APP's as they will have a likely sooner availability than me. If she wants to wait for me, that she can be scheduled for a new patient clinic appointment (new patient visit only do not use held or overbook slot). In the interim she needs to follow-up with her current GI provider and/your PCP if other issues arise.   Aloha Finner, MD [facility] Office # [phone]

## 2023-09-13 NOTE — Telephone Encounter (Signed)
 Currently I am the hospital MD. Will make final determination by end of week.

## 2023-09-13 NOTE — Telephone Encounter (Signed)
 Good morning Dr. Wilhelmenia,   We received a call from patient requesting to transfer her care over to you. Patient requested to transfer her care in August 2024 but was advised to contact us  again in 6-12 months. Patient would still like to transfer her care due to her providers being within the [facility]. Also stated she was referred to you by a friend who is a current patient of yours. Patient would like to be seen for IBS. Please review and advise on transfer of care request.   Thank you.

## 2023-09-14 ENCOUNTER — Encounter: Payer: Self-pay | Admitting: Gastroenterology

## 2023-09-14 NOTE — Telephone Encounter (Signed)
 Left patient voicemail to call back to discuss scheduling

## 2023-09-15 DIAGNOSIS — M7542 Impingement syndrome of left shoulder: Secondary | ICD-10-CM | POA: Diagnosis not present

## 2023-09-20 ENCOUNTER — Ambulatory Visit (INDEPENDENT_AMBULATORY_CARE_PROVIDER_SITE_OTHER): Admitting: Internal Medicine

## 2023-09-20 VITALS — BP 130/70 | HR 70 | Temp 98.3°F | Wt 177.3 lb

## 2023-09-20 DIAGNOSIS — E66811 Obesity, class 1: Secondary | ICD-10-CM

## 2023-09-20 DIAGNOSIS — K219 Gastro-esophageal reflux disease without esophagitis: Secondary | ICD-10-CM | POA: Diagnosis not present

## 2023-09-20 DIAGNOSIS — E039 Hypothyroidism, unspecified: Secondary | ICD-10-CM | POA: Diagnosis not present

## 2023-09-20 DIAGNOSIS — R7302 Impaired glucose tolerance (oral): Secondary | ICD-10-CM

## 2023-09-20 DIAGNOSIS — E785 Hyperlipidemia, unspecified: Secondary | ICD-10-CM | POA: Diagnosis not present

## 2023-09-20 DIAGNOSIS — E6609 Other obesity due to excess calories: Secondary | ICD-10-CM

## 2023-09-20 DIAGNOSIS — F341 Dysthymic disorder: Secondary | ICD-10-CM

## 2023-09-20 DIAGNOSIS — Z6832 Body mass index (BMI) 32.0-32.9, adult: Secondary | ICD-10-CM

## 2023-09-20 LAB — POCT GLYCOSYLATED HEMOGLOBIN (HGB A1C): Hemoglobin A1C: 5.9 % — AB (ref 4.0–5.6)

## 2023-09-20 MED ORDER — HYOSCYAMINE SULFATE ER 0.375 MG PO TB12: 0.3750 mg | ORAL_TABLET | Freq: Every day | ORAL | 0 refills | Status: AC

## 2023-09-20 NOTE — Progress Notes (Signed)
 Established Patient Office Visit     CC/Reason for Visit: Follow-up of conditions, medication refills  HPI: Brianna Mack is a 78 y.o. female who is coming in today for the above mentioned reasons. Past Medical History is significant for: Impaired glucose tolerance, hypothyroidism, hyperlipidemia, depression and anxiety, GERD.  Is requesting a refill of her hyoscyamine .  She is feeling well without major concerns or complaints.   Past Medical/Surgical History: Past Medical History:  Diagnosis Date   Anxiety    Arthritis    Depression    HSV (herpes simplex virus) anogenital infection 11/2015   IBS (irritable bowel syndrome)    Pemphigus vulgaris    Stress fracture     Past Surgical History:  Procedure Laterality Date   ABDOMINAL HYSTERECTOMY  2001   BSO ; SLING PROCEDURE endometriosis/leiomyomata   CATARACT EXTRACTION, BILATERAL     CHOLECYSTECTOMY  1976   DILATION AND CURETTAGE OF UTERUS     HAND SURGERY  04-02-2011   right   OOPHORECTOMY     BSO   ORIF SHOULDER FRACTURE Right 06/28/2014   Procedure: RIGHT SHOULDER HEMI-ARTHROPLASTY;  Surgeon: Marcey Her, MD;  Location: [facility];  Service: Orthopedics;  Laterality: Right;   TONSILLECTOMY  1954    Social History:  reports that she quit smoking about 46 years ago. Her smoking use included cigarettes. She has never used smokeless tobacco. She reports current alcohol use. She reports that she does not use drugs.  Allergies: Allergies  Allergen Reactions   Augmentin  [Amoxicillin -Pot Clavulanate] Itching and Swelling    Headache   Meloxicam  Other (See Comments)    Stomach upset   Cephalexin Diarrhea   Codeine Sulfate     REACTION: nausea, vomiting   Fluconazole Itching and Swelling    REACTION: swelling and itching   Morphine     REACTION: nausea, itching   Oxycodone  Nausea Only   Sulfonamide Derivatives     Other Reaction(s): Unknown   Amoxicillin  Rash   Cyclobenzaprine      Per patient makes her very  groggy-unlike usual effect   Other Swelling    Pt states shes also allergic to an antibiotic but does not know the name or have her list with her today, this antibiotic causes face swelling.    Family History:  Family History  Problem Relation Age of Onset   Diabetes Mother    Hypertension Mother    Cancer Mother        ANGIOSARCOMA   COPD Father    Thyroid  disease Sister    Scoliosis Sister      Current Outpatient Medications:    acetaminophen  (TYLENOL ) 500 MG tablet, Take 1 tablet (500 mg total) by mouth every 6 (six) hours as needed for mild pain or moderate pain., Disp: 30 tablet, Rfl: 0   ALPRAZolam  (XANAX ) 0.25 MG tablet, Take 1 tablet (0.25 mg total) by mouth daily as needed for anxiety., Disp: 30 tablet, Rfl: 0   atorvastatin  (LIPITOR) 10 MG tablet, TAKE ONE TABLET BY MOUTH DAILY, Disp: 90 tablet, Rfl: 1   Calcium  Carb-Cholecalciferol (CALCIUM  500 + D) 500-3.125 MG-MCG TABS, Take by mouth., Disp: , Rfl:    cholecalciferol (VITAMIN D3) 25 MCG (1000 UNIT) tablet, Take 1,000 Units by mouth daily., Disp: , Rfl:    citalopram  (CELEXA ) 20 MG tablet, Take 1 tablet (20 mg total) by mouth daily., Disp: 90 tablet, Rfl: 3   hyoscyamine  (LEVBID ) 0.375 MG 12 hr tablet, Take 0.375 mg by mouth 2 (two)  times daily., Disp: , Rfl:    hyoscyamine  (LEVBID ) 0.375 MG 12 hr tablet, Take 1 tablet (0.375 mg total) by mouth daily., Disp: 30 tablet, Rfl: 0   hyoscyamine  (LEVSIN ) 0.125 MG tablet, Take 0.125 mg by mouth every 4 (four) hours as needed., Disp: , Rfl:    Lactobacillus (ACIDOPHILUS PO), Acidophilus, Disp: , Rfl:    levothyroxine  (SYNTHROID ) 25 MCG tablet, Take 1 tablet (25 mcg total) by mouth daily., Disp: 90 tablet, Rfl: 1   Propylene Glycol (SYSTANE BALANCE) 0.6 % SOLN, 1 drop Once Daily., Disp: , Rfl:    valACYclovir  (VALTREX ) 500 MG tablet, Take 1 tablet (500 mg total) by mouth 2 (two) times daily as needed., Disp: 30 tablet, Rfl: 1  Review of Systems:  Negative unless indicated in  HPI.   Physical Exam: Vitals:   09/20/23 0943  BP: 130/70  Pulse: 70  Temp: 98.3 F (36.8 C)  TempSrc: Oral  SpO2: 96%  Weight: 177 lb 4.8 oz (80.4 kg)    Body mass index is 32.43 kg/m.   Physical Exam   Impression and Plan:  IGT (impaired glucose tolerance) -     POCT glycosylated hemoglobin (Hb A1C)  Hyperlipidemia, unspecified hyperlipidemia type -     Lipid panel; Future  Acquired hypothyroidism  Gastroesophageal reflux disease without esophagitis -     Hyoscyamine  Sulfate ER; Take 1 tablet (0.375 mg total) by mouth daily.  Dispense: 30 tablet; Refill: 0  ANXIETY DEPRESSION  Class 1 obesity due to excess calories with serious comorbidity and body mass index (BMI) of 32.0 to 32.9 in adult -     Amb Ref to Medical Weight Management  -A1c is stable to slightly improved at 5.9, continue to work on lifestyle changes, to that effect she is requesting a referral to weight and wellness. - Lipids were at goal at last check, continue atorvastatin . - Continue thyroid  replacement, check TSH when she returns for CPE. - For her mood disorder she is on Celexa  and alprazolam .   Time spent:30 minutes reviewing chart, interviewing and examining patient and formulating plan of care.     Tully Theophilus Andrews, MD [facility]

## 2023-09-21 ENCOUNTER — Ambulatory Visit: Admitting: Internal Medicine

## 2023-09-21 ENCOUNTER — Encounter (INDEPENDENT_AMBULATORY_CARE_PROVIDER_SITE_OTHER): Payer: Self-pay

## 2023-09-21 DIAGNOSIS — M7542 Impingement syndrome of left shoulder: Secondary | ICD-10-CM | POA: Diagnosis not present

## 2023-09-22 ENCOUNTER — Other Ambulatory Visit: Payer: Self-pay | Admitting: Orthopedic Surgery

## 2023-09-22 DIAGNOSIS — M67912 Unspecified disorder of synovium and tendon, left shoulder: Secondary | ICD-10-CM | POA: Diagnosis not present

## 2023-09-22 DIAGNOSIS — M25511 Pain in right shoulder: Secondary | ICD-10-CM | POA: Diagnosis not present

## 2023-09-22 DIAGNOSIS — G8929 Other chronic pain: Secondary | ICD-10-CM

## 2023-09-24 ENCOUNTER — Ambulatory Visit
Admission: RE | Admit: 2023-09-24 | Discharge: 2023-09-24 | Disposition: A | Discharge status: Home / Self Care | Source: Ambulatory Visit | Attending: Orthopedic Surgery | Admitting: Orthopedic Surgery

## 2023-09-24 DIAGNOSIS — M19011 Primary osteoarthritis, right shoulder: Secondary | ICD-10-CM | POA: Diagnosis not present

## 2023-09-24 DIAGNOSIS — Z96611 Presence of right artificial shoulder joint: Secondary | ICD-10-CM | POA: Diagnosis not present

## 2023-09-24 DIAGNOSIS — G8929 Other chronic pain: Secondary | ICD-10-CM

## 2023-10-04 DIAGNOSIS — M67912 Unspecified disorder of synovium and tendon, left shoulder: Secondary | ICD-10-CM | POA: Diagnosis not present

## 2023-10-04 DIAGNOSIS — M25511 Pain in right shoulder: Secondary | ICD-10-CM | POA: Diagnosis not present

## 2023-10-09 DIAGNOSIS — M25512 Pain in left shoulder: Secondary | ICD-10-CM | POA: Diagnosis not present

## 2023-10-11 NOTE — Progress Notes (Unsigned)
 [address] [place], [place], [place] [address] Office: [phone]  /  Fax: [phone]   Initial Consultation    Brianna Mack was seen in clinic today to evaluate for obesity. She is interested in losing weight to improve overall health and reduce the risk of weight related complications. She presents today to review program treatment options, initial physical assessment, and evaluation.    Jaloni does have a history of impaired glucose tolerance, hyperlipidemia, hypothyroidism, GERD, Anxiety/depression and Obesity.  She is followed by Dr. Tully Theophilus Andrews.  She is currently on Atorvastatin  10 mg every day for hyperlipidemia- last lipid panel was good. Currently well controlled on levothyroxine  25 mcg for hypothyroidism. Anxiety/depression symptoms are currently well controlled on Celexa  20 mg every day and Alprazolam  0.25 mg 1/2 tab once a day PRN, treated by OB/GYN Annabella Shutter DNP. She has a history of Vit D deficiency and is currently taking Vitamin D3 1000 units daily.   Anthropometrics and Bioimpedance Analysis   There is no height or weight on file to calculate BMI. Body Fat Mass : *** % Visceral Fat Mass Rating : *** Waist to Height Ratio: ***  Obesity Related Diseases and Complications  Obesity Quality of Life and Psychosocial Complications: {emqolpsychosoc:33006::Reduced health-related quality of life}  Cardiometabolic: {emcardiometabolic complications:33007}  Biomechanical: {embiomechanical:33008}   Weight Related History  She was referred by: {emreferby:28303}  When asked what they would like to accomplish? She states: {EMHopetoaccomplish:28304::Adopt a healthier eating pattern and lifestyle,Improve energy levels and physical activity,Improve existing medical conditions,Improve quality of life}  Weight history: ***  Highest weight: ***  Contributing factors: {EMcontributingfactors:28307}  Prior weight loss attempts:  {emweightlossprograms:31590::None}  Current or previous pharmacotherapy: {EM previousRx:28311}  Response to medication: {EMResponsetomedication:28312}  Current nutrition plan: {EMNutritionplan:28309::None}  Greatest challenge with dieting: {emgreatestchallengediet:31593}.  Current level of physical activity: {EMcurrentPA:28310::None}  Barriers to Exercise: {embarrierstoexercise:32606::no barriers}  Readiness and Motivation  On a scale from 0 to 10 How ready are you to make changes to your eating and physical activity to lose weight? {NUMBER 1-10:22536} How important is it for you to lose weight right now ? {NUMBER 1-10:22536} How confident are you that you can lose weight if you try? {NUMBER [id number]  Past Medical History   Past Medical History:  Diagnosis Date   Anxiety    Arthritis    Depression    HSV (herpes simplex virus) anogenital infection 11/2015   IBS (irritable bowel syndrome)    Pemphigus vulgaris    Stress fracture      Objective    There were no vitals taken for this visit. She was weighed on the bioimpedance scale: There is no height or weight on file to calculate BMI.    General:  Alert, oriented and cooperative. Patient is in no acute distress.  Respiratory: Normal respiratory effort, no problems with respiration noted   Gait: able to ambulate independently  Mental Status: Normal mood and affect. Normal behavior. Normal judgment and thought content.   Diagnostic Data Reviewed  BMET    Component Value Date/Time   NA 139 09/01/2022 1036   K 4.4 09/01/2022 1036   CL 105 09/01/2022 1036   CO2 28 09/01/2022 1036   GLUCOSE 86 09/01/2022 1036   BUN 15 09/01/2022 1036   CREATININE 0.64 09/01/2022 1036   CALCIUM  9.5 09/01/2022 1036   GFRNONAA >60 12/14/2019 2240   GFRAA >60 06/28/2014 0405   Lab Results  Component Value Date   HGBA1C 5.9 (A) 09/20/2023   HGBA1C 5.8 07/18/2019  No results found for: INSULIN CBC    Component  Value Date/Time   WBC 6.4 09/01/2022 1036   RBC 4.64 09/01/2022 1036   HGB 13.6 09/01/2022 1036   HCT 43.2 09/01/2022 1036   PLT 356.0 09/01/2022 1036   MCV 93.0 09/01/2022 1036   MCH 29.1 12/14/2019 2240   MCHC 31.6 09/01/2022 1036   RDW 15.4 09/01/2022 1036   Iron/TIBC/Ferritin/ %Sat No results found for: IRON, TIBC, FERRITIN, IRONPCTSAT Lipid Panel     Component Value Date/Time   CHOL 162 09/01/2022 1036   TRIG 50.0 09/01/2022 1036   HDL 72.20 09/01/2022 1036   CHOLHDL 2 09/01/2022 1036   VLDL 10.0 09/01/2022 1036   LDLCALC 80 09/01/2022 1036   LDLCALC 148 (H) 01/25/2020 1208   LDLDIRECT 124.9 05/27/2011 0930   Hepatic Function Panel     Component Value Date/Time   PROT 6.9 09/01/2022 1036   ALBUMIN 4.1 09/01/2022 1036   AST 18 09/01/2022 1036   ALT 13 09/01/2022 1036   ALKPHOS 74 09/01/2022 1036   BILITOT 0.6 09/01/2022 1036   BILIDIR 0.1 02/19/2016 0917      Component Value Date/Time   TSH 2.62 09/01/2022 1036    Medications  Outpatient Encounter Medications as of 10/12/2023  Medication Sig   acetaminophen  (TYLENOL ) 500 MG tablet Take 1 tablet (500 mg total) by mouth every 6 (six) hours as needed for mild pain or moderate pain.   ALPRAZolam  (XANAX ) 0.25 MG tablet Take 1 tablet (0.25 mg total) by mouth daily as needed for anxiety.   atorvastatin  (LIPITOR) 10 MG tablet TAKE ONE TABLET BY MOUTH DAILY   Calcium  Carb-Cholecalciferol (CALCIUM  500 + D) 500-3.125 MG-MCG TABS Take by mouth.   cholecalciferol (VITAMIN D3) 25 MCG (1000 UNIT) tablet Take 1,000 Units by mouth daily.   citalopram  (CELEXA ) 20 MG tablet Take 1 tablet (20 mg total) by mouth daily.   hyoscyamine  (LEVBID ) 0.375 MG 12 hr tablet Take 0.375 mg by mouth 2 (two) times daily.   hyoscyamine  (LEVBID ) 0.375 MG 12 hr tablet Take 1 tablet (0.375 mg total) by mouth daily.   hyoscyamine  (LEVSIN ) 0.125 MG tablet Take 0.125 mg by mouth every 4 (four) hours as needed.   Lactobacillus (ACIDOPHILUS PO)  Acidophilus   levothyroxine  (SYNTHROID ) 25 MCG tablet Take 1 tablet (25 mcg total) by mouth daily.   Propylene Glycol (SYSTANE BALANCE) 0.6 % SOLN 1 drop Once Daily.   valACYclovir  (VALTREX ) 500 MG tablet Take 1 tablet (500 mg total) by mouth 2 (two) times daily as needed.   No facility-administered encounter medications on file as of 10/12/2023.     Assessment and Plan   There are no diagnoses linked to this encounter.     Obesity Treatment and Action Plan:  {EMobesityactionplanscribe:28314::Patient will work on garnering support from family and friends to begin weight loss journey.,Will work on eliminating or reducing the presence of highly palatable, calorie dense foods in the home.,Will complete provided nutritional and psychosocial assessment questionnaire before the next appointment.,Will be scheduled for indirect calorimetry to determine resting energy expenditure in a fasting state.  This will allow us  to create a reduced calorie, high-protein meal plan to promote loss of fat mass while preserving muscle mass.,Counseled on the health benefits of losing 5%-15% of total body weight.,Was counseled on nutritional approaches to weight loss and benefits of reducing processed foods and consuming plant-based foods and high quality protein as part of nutritional weight management.,Was counseled on pharmacotherapy and role as an adjunct in  weight management. }  Education and Additional resources  She was weighed on the bioimpedance scale and results were discussed and documented in the synopsis.  We discussed obesity as a progressive, chronic disease and the importance of a more detailed evaluation of all the factors contributing to the disease.  We reviewed the basic principles in obesity management.   We discussed the importance of long term lifestyle changes which include nutrition, exercise and behavioral modification as well as the importance of customizing this to her  specific health and social needs.  We reviewed the role of medical interventions including pharmacotherapy and surgical interventions.   We discussed the benefits of reaching a healthier weight to alleviate the symptoms of existing conditions and reduce the risks of the biomechanical, cardiometabolic and psychological effects of obesity.  We reviewed our program approach and philosophy, which are guided by the four pillars of obesity medicine.  We discussed how to prepare for intake appointment and the importance of fasting and avoidance of stimulants for at least 8 hours prior to indirect calorimetry.  Beverley Latanya Flies appears to be in the action stage of change and reports being ready to initiate intensive lifestyle and behavioral modifications as part of their weight loss journey.  Attestation  Reviewed by clinician on day of visit: allergies, medications, problem list, medical history, surgical history, family history, social history, and previous encounter notes pertinent to obesity diagnosis.  I have spent *** minutes in the care of the patient today including: {NUMBER 1-10:22536} minutes before the visit reviewing and preparing the chart. *** minutes face-to-face {emfacetoface:32598::assessing and reviewing listed medical problems as outlined in obesity care plan,providing nutritional and behavioral counseling on topics outlined in the obesity care plan,independently interpreting test results and goals of care, as described in assessment and plan,reviewing and discussing biometric information and progress} {NUMBER 1-10:22536} minutes after the visit updating chart and documentation of encounter.  Yordin Rhoda ANP-C

## 2023-10-12 ENCOUNTER — Ambulatory Visit: Admitting: Family Medicine

## 2023-10-12 ENCOUNTER — Encounter (INDEPENDENT_AMBULATORY_CARE_PROVIDER_SITE_OTHER): Payer: Self-pay | Admitting: Nurse Practitioner

## 2023-10-12 ENCOUNTER — Ambulatory Visit (INDEPENDENT_AMBULATORY_CARE_PROVIDER_SITE_OTHER): Admitting: Nurse Practitioner

## 2023-10-12 ENCOUNTER — Encounter (INDEPENDENT_AMBULATORY_CARE_PROVIDER_SITE_OTHER): Payer: Self-pay

## 2023-10-12 VITALS — BP 145/76 | HR 68 | Temp 98.2°F | Ht 62.0 in | Wt 176.4 lb

## 2023-10-12 DIAGNOSIS — E039 Hypothyroidism, unspecified: Secondary | ICD-10-CM | POA: Diagnosis not present

## 2023-10-12 DIAGNOSIS — K219 Gastro-esophageal reflux disease without esophagitis: Secondary | ICD-10-CM

## 2023-10-12 DIAGNOSIS — E66811 Obesity, class 1: Secondary | ICD-10-CM

## 2023-10-12 DIAGNOSIS — E785 Hyperlipidemia, unspecified: Secondary | ICD-10-CM

## 2023-10-12 DIAGNOSIS — F341 Dysthymic disorder: Secondary | ICD-10-CM

## 2023-10-12 DIAGNOSIS — R7303 Prediabetes: Secondary | ICD-10-CM | POA: Diagnosis not present

## 2023-10-12 DIAGNOSIS — E559 Vitamin D deficiency, unspecified: Secondary | ICD-10-CM

## 2023-10-12 DIAGNOSIS — R7302 Impaired glucose tolerance (oral): Secondary | ICD-10-CM

## 2023-10-12 DIAGNOSIS — Z6832 Body mass index (BMI) 32.0-32.9, adult: Secondary | ICD-10-CM

## 2023-10-12 DIAGNOSIS — Z0289 Encounter for other administrative examinations: Secondary | ICD-10-CM

## 2023-10-15 ENCOUNTER — Other Ambulatory Visit: Payer: Self-pay | Admitting: Internal Medicine

## 2023-10-15 DIAGNOSIS — E785 Hyperlipidemia, unspecified: Secondary | ICD-10-CM

## 2023-10-19 DIAGNOSIS — M75112 Incomplete rotator cuff tear or rupture of left shoulder, not specified as traumatic: Secondary | ICD-10-CM | POA: Diagnosis not present

## 2023-11-03 DIAGNOSIS — M75112 Incomplete rotator cuff tear or rupture of left shoulder, not specified as traumatic: Secondary | ICD-10-CM | POA: Diagnosis not present

## 2023-11-08 ENCOUNTER — Encounter (INDEPENDENT_AMBULATORY_CARE_PROVIDER_SITE_OTHER): Payer: Self-pay

## 2023-11-10 ENCOUNTER — Ambulatory Visit (INDEPENDENT_AMBULATORY_CARE_PROVIDER_SITE_OTHER): Admitting: Nurse Practitioner

## 2023-11-12 DIAGNOSIS — E039 Hypothyroidism, unspecified: Secondary | ICD-10-CM | POA: Diagnosis not present

## 2023-11-19 ENCOUNTER — Encounter: Payer: Self-pay | Admitting: Gastroenterology

## 2023-11-19 ENCOUNTER — Ambulatory Visit: Admitting: Gastroenterology

## 2023-11-19 ENCOUNTER — Other Ambulatory Visit: Payer: Self-pay | Admitting: Nurse Practitioner

## 2023-11-19 VITALS — BP 130/66 | HR 82 | Ht 61.0 in | Wt 184.4 lb

## 2023-11-19 DIAGNOSIS — R1114 Bilious vomiting: Secondary | ICD-10-CM | POA: Insufficient documentation

## 2023-11-19 DIAGNOSIS — E039 Hypothyroidism, unspecified: Secondary | ICD-10-CM | POA: Diagnosis not present

## 2023-11-19 DIAGNOSIS — R7303 Prediabetes: Secondary | ICD-10-CM | POA: Diagnosis not present

## 2023-11-19 DIAGNOSIS — E782 Mixed hyperlipidemia: Secondary | ICD-10-CM | POA: Diagnosis not present

## 2023-11-19 DIAGNOSIS — F418 Other specified anxiety disorders: Secondary | ICD-10-CM

## 2023-11-19 DIAGNOSIS — K580 Irritable bowel syndrome with diarrhea: Secondary | ICD-10-CM | POA: Diagnosis not present

## 2023-11-19 DIAGNOSIS — R1030 Lower abdominal pain, unspecified: Secondary | ICD-10-CM

## 2023-11-19 DIAGNOSIS — Z1211 Encounter for screening for malignant neoplasm of colon: Secondary | ICD-10-CM | POA: Insufficient documentation

## 2023-11-19 MED ORDER — ONDANSETRON 4 MG PO TBDP: 4.0000 mg | ORAL_TABLET | Freq: Two times a day (BID) | ORAL | 1 refills | Status: AC

## 2023-11-19 NOTE — Telephone Encounter (Signed)
 Med refill request:  Alprazolam  (Xanax ) 0.25 mg tablet Start: 08/18/23 - Disp: 30 tablet with 0 refills  Last B&P:  06/03/2022 LOV: 08/18/2023   Next AEX:  08/22/2024 Last MMG (if hormonal med): 11/2022 Refill authorized? Please Advise.

## 2023-11-19 NOTE — Patient Instructions (Signed)
 You have been scheduled for a CT scan of the abdomen and pelvis at [facility], 1st floor Radiology. You are scheduled on 11/29/23 at 3:00 pm. You should arrive at 1:00 pm (2 hours prior) to your appointment time for registration and to drink oral contrast.   Please follow the written instructions below on the day of your exam:   1) Do not eat anything after 11:00 am  (4 hours prior to your test)   You may take any medications as prescribed with a small amount of water, if necessary. If you take any of the following medications: METFORMIN, GLUCOPHAGE, GLUCOVANCE, AVANDAMET, RIOMET, FORTAMET, ACTOPLUS MET, JANUMET, GLUMETZA or METAGLIP, you MAY be asked to HOLD this medication 48 hours AFTER the exam.   The purpose of you drinking the oral contrast is to aid in the visualization of your intestinal tract. The contrast solution may cause some diarrhea. Depending on your individual set of symptoms, you may also receive an intravenous injection of x-ray contrast/dye. Plan on being at [facility] for 45 minutes or longer, depending on the type of exam you are having performed.   If you have any questions regarding your exam or if you need to reschedule, you may call [facility] at [phone] between the hours of 8:00 am and 5:00 pm, Monday-Friday.    We have sent the following medications to your pharmacy for you to pick up at your convenience: Zofran    _______________________________________________________  If your blood pressure at your visit was 140/90 or greater, please contact your primary care physician to follow up on this.  _______________________________________________________  If you are age 28 or older, your body mass index should be between 23-30. Your Body mass index is 34.84 kg/m. If this is out of the aforementioned range listed, please consider follow up with your Primary Care Provider.  If you are age 39 or younger, your body mass index should be between  19-25. Your Body mass index is 34.84 kg/m. If this is out of the aformentioned range listed, please consider follow up with your Primary Care Provider.   ________________________________________________________  The [facility] providers would like to encourage you to use MYCHART to communicate with providers for non-urgent requests or questions.  Due to long hold times on the telephone, sending your provider a message by [facility] may be a faster and more efficient way to get a response.  Please allow 48 business hours for a response.  Please remember that this is for non-urgent requests.  _______________________________________________________  [facility] is using a team-based approach to care.  Your team is made up of your doctor and two to three APPS. Our APPS (Nurse Practitioners and Physician Assistants) work with your physician to ensure care continuity for you. They are fully qualified to address your health concerns and develop a treatment plan. They communicate directly with your gastroenterologist to care for you. Seeing the Advanced Practice Practitioners on your physician's team can help you by facilitating care more promptly, often allowing for earlier appointments, access to diagnostic testing, procedures, and other specialty referrals.   Thank you for choosing me and [facility].  Dr. Wilhelmenia

## 2023-11-19 NOTE — Progress Notes (Signed)
 GASTROENTEROLOGY OUTPATIENT CLINIC VISIT   Primary Care Provider Theophilus Andrews, Tully GRADE, MD [address] [place] [address] [phone]  Referring Provider Theophilus Andrews, Tully GRADE, MD [address] [place],  [place] [address] [phone]  Patient Profile: Brianna Mack is a 78 y.o. female with a pmh significant for hyperlipidemia, hypothyroidism, prediabetes, anxiety, MDD, arthritis, GERD, IBS The patient presents to the [facility] for an evaluation and management of problem(s) noted below:  Discussed the use of AI scribe software for clinical note transcription with the patient, who gave verbal consent to proceed.  Problem List 1. Irritable bowel syndrome with diarrhea   2. Lower abdominal pain   3. Bilious vomiting with nausea   4. Colon cancer screening    History of Present Illness Parys Elenbaas is a 78 year old female with irritable bowel syndrome diarrhea who presents for evaluation of lower abdominal pain.  This is the patient's first visit to the [facility] GI clinic.  She has previously been followed by [facility] (Dr. Luis) but has transitioned her care after his retirement and to consolidate her care in the [facility] system.  She has a history of IBS-D which is generally controlled with Hyoscyamine  (Levbid  and Levsin ).  This is mostly for when she is having diarrhea.  She takes Levbid  once a day and will use Levsin  PRN.  Over the last 2-years however she developed a new discomfort.  She was initially having sporadic LLQ pain. The pain initially started there and after 20 minutes, it would go away/abate and was relieved with Tylenol  and heat.  It would occur once a month or so.  Over this last year however, it has spread from the LLQ to the periumbilical to the RLQ.  Levbid  has not been helpful as she had thought it would.  It now occurs at least twice a month and will last up to 3-4 hours without relief by Tylenol  or heat until  time passes.  She cannot attribute this to food that she has eaten or need to defecate.  She normally has bowel movements once to twice daily.  Her stools are a bit softer than they were two years ago and there could be a slightly smaller caliber/thinner consistency too.  No blood has been noted.  She has had overall stable weight.  She is going to be part of [facility] in the coming weeks.  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  She has no diverticulosis noted on her last colonoscopy and thus no prior episodes of diverticulitis.  She has had TAH with Oophorectomy so has had surgical interventions in the region.  She has never been told she had adhesive disease, however.  She has not had a CT scan of her abdomen in years.  She experiences rare episodes of pyrosis based on her dietary intake but never a daily basis.  No difficulty swallowing or changes in appetite.  She will have a thyroid  medication adjustment due to recent thyroid  function testing.        The patient does/does not take NSAIDs or BC/Goody Powder. Patient has/has not had an EGD. Patient has/has not had a Colonoscopy.  GI Review of Systems Positive as above Negative for dysphagia, odynophagia, melena, hematochezia  Review of Systems General: Denies fevers/chills/weight loss unintentionally Cardiovascular: Denies chest pain Pulmonary: Denies shortness of breath Gastroenterological: See HPI Genitourinary: Denies darkened urine Hematological: Denies easy bruising/bleeding Dermatological:  Denies jaundice Psychological: Mood is stable  Medications Current Outpatient Medications  Medication Sig Dispense Refill   acetaminophen  (TYLENOL ) 500 MG tablet Take 1 tablet (500 mg total) by mouth every 6 (six) hours as needed for mild pain or moderate pain. 30 tablet 0   ALPRAZolam  (XANAX ) 0.25 MG tablet Take 1 tablet (0.25 mg total) by mouth daily as needed for anxiety. 30 tablet 0    atorvastatin  (LIPITOR) 10 MG tablet TAKE ONE TABLET BY MOUTH DAILY 90 tablet 0   Calcium  Carb-Cholecalciferol (CALCIUM  500 + D) 500-3.125 MG-MCG TABS Take by mouth.     cholecalciferol (VITAMIN D3) 25 MCG (1000 UNIT) tablet Take 1,000 Units by mouth daily.     citalopram  (CELEXA ) 20 MG tablet Take 1 tablet (20 mg total) by mouth daily. 90 tablet 3   hyoscyamine  (LEVBID ) 0.375 MG 12 hr tablet Take 1 tablet (0.375 mg total) by mouth daily. 30 tablet 0   hyoscyamine  (LEVSIN ) 0.125 MG tablet Take 0.125 mg by mouth every 4 (four) hours as needed. (Patient taking differently: Take 0.125 mg by mouth 3 (three) times daily. 3 times a day prn)     Lactobacillus (ACIDOPHILUS PO) Acidophilus     levothyroxine  (SYNTHROID ) 25 MCG tablet Take 1 tablet (25 mcg total) by mouth daily. 90 tablet 1   meloxicam  (MOBIC ) 15 MG tablet Take 15 mg by mouth daily as needed.     ondansetron  (ZOFRAN -ODT) 4 MG disintegrating tablet Take 1 tablet (4 mg total) by mouth 2 (two) times daily. 15 tablet 1   Propylene Glycol (SYSTANE BALANCE) 0.6 % SOLN 1 drop Once Daily.     traMADol (ULTRAM) 50 MG tablet Take 50 mg by mouth 2 (two) times daily as needed.     valACYclovir  (VALTREX ) 500 MG tablet Take 1 tablet (500 mg total) by mouth 2 (two) times daily as needed. 30 tablet 1   No current facility-administered medications for this visit.    Allergies Allergies  Allergen Reactions   Augmentin  [Amoxicillin -Pot Clavulanate] Itching and Swelling    Headache   Meloxicam  Other (See Comments)    Stomach upset   Cephalexin Diarrhea   Codeine Sulfate     REACTION: nausea, vomiting   Fluconazole Itching and Swelling    REACTION: swelling and itching   Morphine     REACTION: nausea, itching   Oxycodone  Nausea Only   Sulfonamide Derivatives     Other Reaction(s): Unknown   Amoxicillin  Rash   Cyclobenzaprine      Per patient makes her very groggy-unlike usual effect   Other Swelling    Pt states shes also allergic to an  antibiotic but does not know the name or have her list with her today, this antibiotic causes face swelling.    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   IBS (irritable bowel syndrome)    Pemphigus vulgaris (HCC)    Stress fracture    Past Surgical History:  Procedure Laterality Date   ABDOMINAL HYSTERECTOMY  2001   BSO ; SLING PROCEDURE endometriosis/leiomyomata   CATARACT EXTRACTION, BILATERAL     CHOLECYSTECTOMY  1976   DILATION AND CURETTAGE OF UTERUS     HAND SURGERY  04-02-2011   right   OOPHORECTOMY     BSO   ORIF SHOULDER FRACTURE Right 06/28/2014   Procedure: RIGHT SHOULDER HEMI-ARTHROPLASTY;  Surgeon: Marcey Her, MD;  Location: [facility];  Service: Orthopedics;  Laterality: Right;   TONSILLECTOMY  1954   Social History   Socioeconomic History   Marital status: Widowed    Spouse name: Not on file   Number of children: Not on file   Years of education: Not on file   Highest education level: Associate degree: occupational, Scientist, product/process development, or vocational program  Occupational History   Not on file  Tobacco Use   Smoking status: Former    Current packs/day: 0.00    Types: Cigarettes    Quit date: 08/03/1977    Years since quitting: 46.3   Smokeless tobacco: Never  Vaping Use   Vaping status: Never Used  Substance and Sexual Activity   Alcohol use: Yes    Alcohol/week: 0.0 standard drinks of alcohol    Comment: occasionally   Drug use: No   Sexual activity: Not Currently    Birth control/protection: Surgical    Comment: Hyst; First IC >16 y/o, <5 Partners, HSV 2+  Other Topics Concern   Not on file  Social History Narrative   Not on file   Social Drivers of Health   Financial Resource Strain: Low Risk  (09/20/2023)   Overall Financial Resource Strain (CARDIA)    Difficulty of Paying Living Expenses: Not hard at all  Food Insecurity: No Food Insecurity  (09/20/2023)   Hunger Vital Sign    Worried About Running Out of Food in the Last Year: Never true    Ran Out of Food in the Last Year: Never true  Transportation Needs: No Transportation Needs (09/20/2023)   PRAPARE - Administrator, Civil Service (Medical): No    Lack of Transportation (Non-Medical): No  Physical Activity: Inactive (09/20/2023)   Exercise Vital Sign    Days of Exercise per Week: 0 days    Minutes of Exercise per Session: Not on file  Stress: No Stress Concern Present (09/20/2023)   [employer] of Occupational Health - Occupational Stress Questionnaire    Feeling of Stress: Not at all  Social Connections: Unknown (09/20/2023)   Social Connection and Isolation Panel    Frequency of Communication with Friends and Family: Not on file    Frequency of Social Gatherings with Friends and Family: Twice a week    Attends Religious Services: Patient declined    Active Member of Clubs or Organizations: Yes    Attends Banker Meetings: 1 to 4 times per year    Marital Status: Widowed  Intimate Partner Violence: Not At Risk (08/31/2022)   Humiliation, Afraid, Rape, and Kick questionnaire    Fear of Current or Ex-Partner: No    Emotionally Abused: No    Physically Abused: No    Sexually Abused: No   Family History  Problem Relation Age of Onset   Diabetes Mother    Hypertension Mother    Cancer Mother        ANGIOSARCOMA   COPD Father    Thyroid  disease Sister    Scoliosis Sister    Colon cancer Neg Hx    Esophageal cancer Neg Hx    Inflammatory bowel disease Neg Hx    Liver disease Neg Hx    Pancreatic cancer Neg Hx    Rectal cancer Neg Hx    Stomach cancer Neg Hx    I have reviewed her medical, social, and family history in detail and updated the electronic medical record as necessary.    PHYSICAL EXAMINATION  BP 130/66   Pulse 82  Ht 5' 1 (1.549 m)   Wt 184 lb 6 oz (83.6 kg)   BMI 34.84 kg/m  Wt Readings from Last 3 Encounters:   11/19/23 184 lb 6 oz (83.6 kg)  10/12/23 176 lb 6.4 oz (80 kg)  09/20/23 177 lb 4.8 oz (80.4 kg)  GEN: NAD, appears stated age, doesn't appear chronically ill PSYCH: Cooperative, without pressured speech EYE: Conjunctivae pink, sclerae anicteric ENT: MMM CV: Nontachycardic RESP: No audible wheezing GI: NABS, soft, NT/ND, without rebound or guarding, no HSM appreciated MSK/EXT: No significant lower extremity edema SKIN: No jaundice NEURO:  Alert & Oriented x 3, no focal deficits   REVIEW OF DATA  I reviewed the following data at the time of this encounter:  GI Procedures and Studies  2019 colonoscopy outside No neoplasia.  Normal colonoscopic examination. Recommendation 10-year follow-up  Laboratory Studies  Reviewed those in epic  Imaging Studies  No relevant studies to review   ASSESSMENT/PLAN  Ms. Web is a 78 y.o. female.  The patient is seen today for evaluation and management of:  1. Irritable bowel syndrome with diarrhea   2. Lower abdominal pain   3. Bilious vomiting with nausea   4. Colon cancer screening    The patient is hemodynamically and clinically stable at this time.     Episodic Lower abdominal pain Etiology of newer onset for the last year of episodic lower abdominal pain is not clear.  Does not sound truly MSK and she has such significant issues that lead to nausea/vomiting at times.  Adhesive disease could be possible if she was having intermittent partial bowel obstructions but one would expect more symptoms.  Will want to rule out internal herniations as well as other disease process of the omentum and abdomen/pelvis.  CT scan will help clarify things further.  If workup unremarkable, will need to consider a diagnostic colonoscopy and then if also negative, consider functional abdominal pain and TCA use.  Time will tell.   - Order CT scan of the abdomen/pelvis with contrast. - Prescribe Zofran  4 mg oral dissolving tablet as needed for nausea if  necessary. - Consider diagnostic colonscopy - Discuss potential for functional abdominal pain if CT scan is unremarkable.  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 Recent changes in bowel habits noted, so will need to monitor and if things progress/worsen will need to consider diagnostic colonoscopy.   - Continue Levbid  once daily. - Continue Levsin  as needed for diarrhea. - Diagnostic colonoscopy to be considered if issues progress   Colon Cancer Screening - Due in 2029 (pending health at the time)    Orders Placed This Encounter  Procedures   CT ABDOMEN PELVIS W CONTRAST    New Prescriptions   ONDANSETRON  (ZOFRAN -ODT) 4 MG DISINTEGRATING TABLET    Take 1 tablet (4 mg total) by mouth 2 (two) times daily.   Modified Medications   No medications on file    Planned Follow Up No follow-ups on file.   Total Time in Face-to-Face and in Coordination of Care for patient including independent/personal interpretation/review of prior testing, medical history, examination, medication adjustment, communicating results with the patient directly, and documentation within the EHR is 45 minutes.   Aloha Finner, MD [facility] Office # [phone]

## 2023-11-24 ENCOUNTER — Ambulatory Visit (INDEPENDENT_AMBULATORY_CARE_PROVIDER_SITE_OTHER): Admitting: Nurse Practitioner

## 2023-11-24 ENCOUNTER — Encounter (INDEPENDENT_AMBULATORY_CARE_PROVIDER_SITE_OTHER): Payer: Self-pay | Admitting: Nurse Practitioner

## 2023-11-24 VITALS — BP 148/78 | HR 66 | Temp 98.2°F | Ht 62.0 in | Wt 178.0 lb

## 2023-11-24 DIAGNOSIS — E039 Hypothyroidism, unspecified: Secondary | ICD-10-CM | POA: Diagnosis not present

## 2023-11-24 DIAGNOSIS — E785 Hyperlipidemia, unspecified: Secondary | ICD-10-CM | POA: Diagnosis not present

## 2023-11-24 DIAGNOSIS — R0602 Shortness of breath: Secondary | ICD-10-CM

## 2023-11-24 DIAGNOSIS — R7303 Prediabetes: Secondary | ICD-10-CM

## 2023-11-24 DIAGNOSIS — R5383 Other fatigue: Secondary | ICD-10-CM

## 2023-11-24 DIAGNOSIS — Z1331 Encounter for screening for depression: Secondary | ICD-10-CM | POA: Diagnosis not present

## 2023-11-24 DIAGNOSIS — T733XXA Exhaustion due to excessive exertion, initial encounter: Secondary | ICD-10-CM

## 2023-11-24 DIAGNOSIS — E559 Vitamin D deficiency, unspecified: Secondary | ICD-10-CM | POA: Diagnosis not present

## 2023-11-24 DIAGNOSIS — E66811 Obesity, class 1: Secondary | ICD-10-CM

## 2023-11-24 DIAGNOSIS — Z6832 Body mass index (BMI) 32.0-32.9, adult: Secondary | ICD-10-CM

## 2023-11-24 NOTE — Progress Notes (Signed)
 [address] [place],  [place], [place] [address]  Office: [phone]  /  Fax: [phone]   Subjective   Initial Visit  Brianna Mack (MR# [id number]) is a 78 y.o. female who presents for evaluation and treatment of obesity and related comorbidities. Current BMI is Body mass index is 32.56 kg/m. Brianna Mack has been struggling with her weight for many years and has been unsuccessful in either losing weight, maintaining weight loss, or reaching her healthy weight goal.  Brianna Mack is currently in the action stage of change and ready to dedicate time achieving and maintaining a healthier weight. Brianna Mack is interested in becoming our patient and working on intensive lifestyle modifications including (but not limited to) diet and exercise for weight loss.  Mild BP elevation today, not currently on medication. Does not check BP at home. Denies headaches, chest pain and shortness of breath.  BP Readings from Last 3 Encounters:  11/24/23 (!) 148/78  11/19/23 130/66  10/12/23 (!) 145/76    Brianna Mack does have a history of impaired glucose tolerance, hyperlipidemia, hypothyroidism, GERD, Anxiety/depression and Obesity.  She is followed by Dr. Tully Theophilus Andrews.  She is currently on Atorvastatin  10 mg every day for hyperlipidemia- last lipid panel was good. Currently well controlled on levothyroxine  25 mcg for hypothyroidism. Anxiety/depression symptoms are currently well controlled on Celexa  20 mg every day and Alprazolam  0.25 mg 1/2 tab once a day PRN, treated by OB/GYN Annabella Shutter DNP. She has a history of Vit D deficiency and is currently taking Vitamin D3 1000 units daily.  She fell and shattered her shoulder in 12/15/2014 and had partial replacement but is having persistent issues and may need to have reverse replacement. Also having issues with left shoulder that is possibly rotator cuff. She is having pain in both shoulders.  She does help care for her sister which does give her stress. Previously cared for her  husband for 4 1/2 years until he died 12-15-15.   As an adult around  age 61 when she started menopause she started gaining weight.  Joined weight watchers age 62 and lost 15 pounds and maintained for 3-4 years. Her parents got ill and she was having to go back and forth to [place] and noticed gradual weight gain of approximately 30 pounds.   Weight history:  When asked how their weight has affected their life and health, she states: Contributed to medical problems, Contributed to orthopedic problems or mobility issues, Having fatigue, and Having poor endurance  When asked what else they would like to accomplish? She states: Adopt a healthier eating pattern and lifestyle, Improve energy levels and physical activity, Improve existing medical conditions, Improve quality of life, and Improve appearance  She starting to note weight gain during : adulthood and menopause.  Life events associated with weight gain include : when driving back and forth to take care of parents.   Other contributing factors: family history of obesity, reduced physical activity, mental health problems, menopause, strong orexigenic signaling and/or inadequate inhibitory control , and multiple weight loss attempts in the past.  Their highest weight has been:  180 lbs.  Desired weight: 150  Previous weight-loss programs : [employer].  Their maximum weight loss was:  15 lbs.  Their greatest challenge with dieting: felt restrictive, dieting fatigue, and low energy.  Current or previous pharmacotherapy: None.  Response to medication: Never tried medications   Nutritional History:  Current nutrition plan: Low-carb.  How many times do you eat outside the home: 1-2  per week  How often do they skip meals: does not skip meals  What beverages do they drink: water, caffeinated beverages , diet soda , sweet tea , and alcohol- wine 2 glasses 3-4 times a week   Use of artificial sweetners : No  Food intolerances or  dislikes: liver.  Food triggers: Stress, Boredom, To help comfort self, and When Sad.  Food cravings: Chocolate and cheese and pizza  Do they struggle with excessive hunger or portion control : Yes    Physical Activity:  Current level of physical activity: None  Barriers to Exercise: time and energy   Past medical history includes:   Past Medical History:  Diagnosis Date   Anxiety    Arthritis    Cancer (HCC)    Depression    High cholesterol    HSV (herpes simplex virus) anogenital infection 11/2015   Hypertension    Hypothyroidism    IBS (irritable bowel syndrome)    Joint pain    Obesity    Pemphigus vulgaris (HCC)    Prediabetes    Shortness of breath    Stress fracture      Objective   BP (!) 148/78   Pulse 66   Temp 98.2 F (36.8 C)   Ht 5' 2 (1.575 m)   Wt 178 lb (80.7 kg)   SpO2 95%   BMI 32.56 kg/m  She was weighed on the bioimpedance scale: Body mass index is 32.56 kg/m.    Anthropometrics:  Vitals Temp: 98.2 F (36.8 C) BP: (!) 148/78 Pulse Rate: 66 SpO2: 95 %   Anthropometric Measurements Height: 5' 2 (1.575 m) Weight: 178 lb (80.7 kg) BMI (Calculated): 32.55 Starting Weight: 178 lb Peak Weight: 180 lb Waist Measurement : 41 inches   Body Composition  Body Fat %: 46 % Fat Mass (lbs): 82.2 lbs Muscle Mass (lbs): 91.6 lbs Total Body Water (lbs): 67.6 lbs Visceral Fat Rating : 14   Other Clinical Data Fasting: yes Labs: yes Today's Visit #: 1 Starting Date: 11/24/23    Physical Exam:  General: She is overweight, cooperative, alert, well developed, and in no acute distress. PSYCH: Has normal mood, affect and thought process.   HEENT: EOMI, sclerae are anicteric. Lungs: Normal breathing effort, no conversational dyspnea. Extremities: No edema.  Neurologic: No gross sensory or motor deficits. No tremors or fasciculations noted.    Diagnostic Data Reviewed  EKG: Normal sinus rhythm, rate 63. No conduction  abnormalities, abnormal Q waves or chamber enlargement.  Indirect Calorimeter completed today shows a VO2 of 149 and a REE of 1022.  Her calculated basal metabolic rate is 8644 thus her resting energy expenditure slower than calculated.  Depression Screen  Feliza's PHQ-9 score was: 2.She is currently on Celexa  20 mg every day for her mood and feels well controlled     11/24/2023   11:44 AM  Depression screen PHQ 2/9  Decreased Interest 0  Down, Depressed, Hopeless 0  PHQ - 2 Score 0  Altered sleeping 1  Tired, decreased energy 1  Change in appetite 1  Feeling bad or failure about yourself  0  Trouble concentrating 0  Moving slowly or fidgety/restless 0  Suicidal thoughts 0  PHQ-9 Score 3  Difficult doing work/chores Somewhat difficult    Screening for Sleep Related Breathing Disorders  Letonya admits to daytime somnolence and admits to waking up still tired. Patient has a history of symptoms of daytime fatigue and morning fatigue. Dinara generally gets 6 or 7  hours of sleep per night, and states that she has nightime awakenings. Snoring is present. Apneic episodes are not present. Epworth Sleepiness Score is 7.   BMET    Component Value Date/Time   NA 139 09/01/2022 1036   K 4.4 09/01/2022 1036   CL 105 09/01/2022 1036   CO2 28 09/01/2022 1036   GLUCOSE 86 09/01/2022 1036   BUN 15 09/01/2022 1036   CREATININE 0.64 09/01/2022 1036   CALCIUM  9.5 09/01/2022 1036   GFRNONAA >60 12/14/2019 2240   GFRAA >60 06/28/2014 0405   Lab Results  Component Value Date   HGBA1C 5.9 (A) 09/20/2023   HGBA1C 5.8 07/18/2019   No results found for: INSULIN CBC    Component Value Date/Time   WBC 6.4 09/01/2022 1036   RBC 4.64 09/01/2022 1036   HGB 13.6 09/01/2022 1036   HCT 43.2 09/01/2022 1036   PLT 356.0 09/01/2022 1036   MCV 93.0 09/01/2022 1036   MCH 29.1 12/14/2019 2240   MCHC 31.6 09/01/2022 1036   RDW 15.4 09/01/2022 1036   Iron/TIBC/Ferritin/ %Sat No results found for: IRON,  TIBC, FERRITIN, IRONPCTSAT Lipid Panel     Component Value Date/Time   CHOL 162 09/01/2022 1036   TRIG 50.0 09/01/2022 1036   HDL 72.20 09/01/2022 1036   CHOLHDL 2 09/01/2022 1036   VLDL 10.0 09/01/2022 1036   LDLCALC 80 09/01/2022 1036   LDLCALC 148 (H) 01/25/2020 1208   LDLDIRECT 124.9 05/27/2011 0930   Hepatic Function Panel     Component Value Date/Time   PROT 6.9 09/01/2022 1036   ALBUMIN 4.1 09/01/2022 1036   AST 18 09/01/2022 1036   ALT 13 09/01/2022 1036   ALKPHOS 74 09/01/2022 1036   BILITOT 0.6 09/01/2022 1036   BILIDIR 0.1 02/19/2016 0917      Component Value Date/Time   TSH 2.62 09/01/2022 1036     Assessment and Plan    Class 1 obesity with serious comorbidity and body mass index (BMI) of 32.0 to 32.9 in adult, unspecified obesity type TREATMENT PLAN FOR OBESITY:  Recommended Dietary Goals  Adiya is currently in the action stage of change. As such, her goal is to implement medically supervised obesity management plan.  She has agreed to implement: the Category 1 plan - 1000 kcal per day  Behavioral Intervention  We discussed the following Behavioral Modification Strategies today: increasing lean protein intake to established goals, decreasing simple carbohydrates , increasing vegetables, increasing lower glycemic fruits, increasing fiber rich foods, avoiding skipping meals, increasing water intake, work on meal planning and preparation, reading food labels , keeping healthy foods at home, identifying sources and decreasing liquid calories, decreasing eating out or consumption of processed foods, and making healthy choices when eating convenient foods, planning for success, and better snacking choices  Additional resources provided today: Handout on healthy eating and balanced plate, Handout on complex carbohydrates and lean sources of protein, Category 1 packet, and Handout principles of weight management  Recommended Physical Activity Goals  Adley has  been advised to work up to 150 minutes of moderate intensity aerobic activity a week and strengthening exercises 2-3 times per week for cardiovascular health, weight loss maintenance and preservation of muscle mass.   She has agreed to :  Think about enjoyable ways to increase daily physical activity and overcoming barriers to exercise and Increase physical activity in their day and reduce sedentary time (increase NEAT).  Medical Interventions and Pharmacotherapy We will work on building a Therapist, art and  behavioral strategies. We will discuss the role of pharmacotherapy as an adjunct at subsequent visits.   ASSOCIATED CONDITIONS ADDRESSED TODAY  Other Fatigue Hannie does feel that her weight is causing her energy to be lower than it should be. Fatigue may be related to obesity, depression or many other causes. Labs will be ordered, and in the meanwhile, Hae will focus on self care including making healthy food choices, increasing physical activity and focusing on stress reduction. -     EKG 12-Lead  Shortness of Breath Leslie notes increasing shortness of breath with physical activity and seems to be worsening over time with weight gain. She notes getting out of breath sooner with activity than she used to. This has not gotten worse recently. Quetzali denies shortness of breath at rest or orthopnea.  Hyperlipidemia, unspecified hyperlipidemia type Focus on implementing category 1 meal plan, limit saturated fats Loss of 10-15% body weight can improve lipid levels Continue Atorvastatin  10 mg every day  -     Comprehensive metabolic panel with GFR -     CBC with Differential/Platelet -     Lipid panel  Prediabetes Start Category 1  meal plan, limit simple carbohydrates Decreasing body weight by 10-15% can improve glucose levels -     Comprehensive metabolic panel with GFR -     CBC with Differential/Platelet -     Insulin, random  Acquired hypothyroidism       Continue  synthroid  25 mcg every day and follow with Dr. Tommas  Vitamin D  deficiency Continue cholecalciferol 1000 units daily and will adjust if Vit D level is still low -     VITAMIN D  25 Hydroxy (Vit-D Deficiency, Fractures)  Class 1 obesity with serious comorbidity and body mass index (BMI) of 32.0 to 32.9 in adult, unspecified obesity type See plan above -     Comprehensive metabolic panel with GFR -     CBC with Differential/Platelet -     Insulin, random -     Lipid panel -     Magnesium -     Vitamin B12 -     VITAMIN D  25 Hydroxy (Vit-D Deficiency, Fractures)  Depression screening Scored 3- currently on Celexa  20 mg every day for mood     Follow-up  She was informed of the importance of frequent follow-up visits to maximize her success with intensive lifestyle modifications for her multiple health conditions. She was informed we would discuss her lab results at her next visit unless there is a critical issue that needs to be addressed sooner. Sarely agreed to keep her next visit at the agreed upon time to discuss these results.  Attestation Statement  This is the patient's intake visit at [facility]. The patient's Health Questionnaire was reviewed at length. Included in the packet: current and past health history, medications, allergies, ROS, gynecologic history (women only), surgical history, family history, social history, weight history, weight loss surgery history (for those that have had weight loss surgery), nutritional evaluation, mood and food questionnaire, PHQ9, Epworth questionnaire, sleep habits questionnaire, patient life and health improvement goals questionnaire. These will all be scanned into the patient's chart under media.   During the visit, I independently reviewed the patient's EKG, previous labs, bioimpedance scale results, and indirect calorimetry results. I used this information to medically tailor a meal plan for the patient that will help her to  lose weight and will improve her obesity-related conditions. I performed a medically necessary appropriate examination and/or evaluation.  I discussed the assessment and treatment plan with the patient. The patient was provided an opportunity to ask questions and all were answered. The patient agreed with the plan and demonstrated an understanding of the instructions. Labs were ordered at this visit and will be reviewed at the next visit unless critical results need to be addressed immediately. Clinical information was updated and documented in the EMR.   In addition, they received basic education on identification of processed foods and reduction of these, different sources of lean proteins and complex carbohydrates and how to eat balanced by incorporation of whole foods.  Reviewed by clinician on day of visit: allergies, medications, problem list, medical history, surgical history, family history, social history, and previous encounter notes.  I have spent 60 minutes in the care of the patient today including: 13 minutes before the visit reviewing and preparing the chart. 38 minutes face-to-face assessing and reviewing listed medical problems as outlined in obesity care plan, providing nutritional and behavioral counseling on topics outlined in the obesity care plan, independently interpreting test results and goals of care, as described in assessment and plan, reviewing and discussing biometric information and progress, reviewing latest PCP notes and specialist consultations, managing referral , and ordering diagnostics - see orders 9 minutes after the visit updating chart and documentation of encounter.       Chailyn Racette ANP-C

## 2023-11-25 ENCOUNTER — Ambulatory Visit (INDEPENDENT_AMBULATORY_CARE_PROVIDER_SITE_OTHER): Payer: Self-pay | Admitting: Nurse Practitioner

## 2023-11-25 LAB — CBC WITH DIFFERENTIAL/PLATELET
Basophils Absolute: 0.1 x10E3/uL (ref 0.0–0.2)
Basos: 1 %
EOS (ABSOLUTE): 0.2 x10E3/uL (ref 0.0–0.4)
Eos: 3 %
Hematocrit: 43 % (ref 34.0–46.6)
Hemoglobin: 13.3 g/dL (ref 11.1–15.9)
Immature Grans (Abs): 0 x10E3/uL (ref 0.0–0.1)
Immature Granulocytes: 0 %
Lymphocytes Absolute: 1.6 x10E3/uL (ref 0.7–3.1)
Lymphs: 23 %
MCH: 28.3 pg (ref 26.6–33.0)
MCHC: 30.9 g/dL — ABNORMAL LOW (ref 31.5–35.7)
MCV: 92 fL (ref 79–97)
Monocytes Absolute: 0.6 x10E3/uL (ref 0.1–0.9)
Monocytes: 9 %
Neutrophils Absolute: 4.2 x10E3/uL (ref 1.4–7.0)
Neutrophils: 64 %
Platelets: 417 x10E3/uL (ref 150–450)
RBC: 4.7 x10E6/uL (ref 3.77–5.28)
RDW: 14.5 % (ref 11.7–15.4)
WBC: 6.7 x10E3/uL (ref 3.4–10.8)

## 2023-11-25 LAB — COMPREHENSIVE METABOLIC PANEL WITH GFR
ALT: 13 IU/L (ref 0–32)
AST: 18 IU/L (ref 0–40)
Albumin: 4.2 g/dL (ref 3.8–4.8)
Alkaline Phosphatase: 104 IU/L (ref 49–135)
BUN/Creatinine Ratio: 22 (ref 12–28)
BUN: 14 mg/dL (ref 8–27)
Bilirubin Total: 0.6 mg/dL (ref 0.0–1.2)
CO2: 22 mmol/L (ref 20–29)
Calcium: 9.3 mg/dL (ref 8.7–10.3)
Chloride: 104 mmol/L (ref 96–106)
Creatinine, Ser: 0.65 mg/dL (ref 0.57–1.00)
Globulin, Total: 2.6 g/dL (ref 1.5–4.5)
Glucose: 83 mg/dL (ref 70–99)
Potassium: 4.4 mmol/L (ref 3.5–5.2)
Sodium: 142 mmol/L (ref 134–144)
Total Protein: 6.8 g/dL (ref 6.0–8.5)
eGFR: 91 mL/min/1.73 (ref >59)

## 2023-11-25 LAB — VITAMIN D 25 HYDROXY (VIT D DEFICIENCY, FRACTURES): Vit D, 25-Hydroxy: 31.6 ng/mL (ref 30.0–100.0)

## 2023-11-25 LAB — LIPID PANEL
Chol/HDL Ratio: 2.2 ratio (ref 0.0–4.4)
Cholesterol, Total: 183 mg/dL (ref 100–199)
HDL: 84 mg/dL (ref >39)
LDL Chol Calc (NIH): 83 mg/dL (ref 0–99)
Triglycerides: 90 mg/dL (ref 0–149)
VLDL Cholesterol Cal: 16 mg/dL (ref 5–40)

## 2023-11-25 LAB — MAGNESIUM: Magnesium: 1.9 mg/dL (ref 1.6–2.3)

## 2023-11-25 LAB — VITAMIN B12: Vitamin B-12: 394 pg/mL (ref 232–1245)

## 2023-11-25 LAB — INSULIN, RANDOM: INSULIN: 13.1 u[IU]/mL (ref 2.6–24.9)

## 2023-11-29 ENCOUNTER — Ambulatory Visit (HOSPITAL_COMMUNITY)

## 2023-12-03 ENCOUNTER — Ambulatory Visit (HOSPITAL_COMMUNITY)
Admission: RE | Admit: 2023-12-03 | Discharge: 2023-12-03 | Disposition: A | Discharge status: Home / Self Care | Source: Ambulatory Visit | Attending: Gastroenterology | Admitting: Gastroenterology

## 2023-12-03 DIAGNOSIS — R1030 Lower abdominal pain, unspecified: Secondary | ICD-10-CM | POA: Insufficient documentation

## 2023-12-03 DIAGNOSIS — K769 Liver disease, unspecified: Secondary | ICD-10-CM | POA: Diagnosis not present

## 2023-12-03 DIAGNOSIS — K580 Irritable bowel syndrome with diarrhea: Secondary | ICD-10-CM | POA: Diagnosis not present

## 2023-12-03 DIAGNOSIS — R1114 Bilious vomiting: Secondary | ICD-10-CM | POA: Insufficient documentation

## 2023-12-03 DIAGNOSIS — R112 Nausea with vomiting, unspecified: Secondary | ICD-10-CM | POA: Diagnosis not present

## 2023-12-03 MED ORDER — IOHEXOL 300 MG/ML  SOLN
100.0000 mL | Freq: Once | INTRAMUSCULAR | Status: AC | PRN
  Administered 2023-12-03: 100 mL via INTRAVENOUS

## 2023-12-03 MED ORDER — IOHEXOL 9 MG/ML PO SOLN
500.0000 mL | ORAL | Status: AC
  Administered 2023-12-03 (×2): 500 mL via ORAL

## 2023-12-06 ENCOUNTER — Ambulatory Visit: Payer: Self-pay | Admitting: Gastroenterology

## 2023-12-06 ENCOUNTER — Other Ambulatory Visit: Payer: Self-pay

## 2023-12-06 DIAGNOSIS — K769 Liver disease, unspecified: Secondary | ICD-10-CM

## 2023-12-08 ENCOUNTER — Ambulatory Visit (INDEPENDENT_AMBULATORY_CARE_PROVIDER_SITE_OTHER): Admitting: Nurse Practitioner

## 2023-12-08 ENCOUNTER — Encounter (INDEPENDENT_AMBULATORY_CARE_PROVIDER_SITE_OTHER): Payer: Self-pay | Admitting: Nurse Practitioner

## 2023-12-08 VITALS — BP 115/70 | HR 65 | Temp 98.1°F | Ht 62.0 in | Wt 174.0 lb

## 2023-12-08 DIAGNOSIS — E559 Vitamin D deficiency, unspecified: Secondary | ICD-10-CM | POA: Diagnosis not present

## 2023-12-08 DIAGNOSIS — Z6832 Body mass index (BMI) 32.0-32.9, adult: Secondary | ICD-10-CM

## 2023-12-08 DIAGNOSIS — E7800 Pure hypercholesterolemia, unspecified: Secondary | ICD-10-CM

## 2023-12-08 DIAGNOSIS — R7303 Prediabetes: Secondary | ICD-10-CM | POA: Diagnosis not present

## 2023-12-08 DIAGNOSIS — E88819 Insulin resistance, unspecified: Secondary | ICD-10-CM

## 2023-12-08 DIAGNOSIS — E66811 Obesity, class 1: Secondary | ICD-10-CM | POA: Diagnosis not present

## 2023-12-08 NOTE — Progress Notes (Signed)
 Office: [phone]  /  Fax: [phone]  WEIGHT SUMMARY AND BIOMETRICS  Weight Lost Since Last Visit: 4 lb  Weight Gained Since Last Visit: 0   Vitals Temp: 98.1 F (36.7 C) BP: 115/70 Pulse Rate: 65 SpO2: 97 %   Anthropometric Measurements Height: 5' 2 (1.575 m) Weight: 174 lb (78.9 kg) BMI (Calculated): 31.82 Weight at Last Visit: 178 lb Weight Lost Since Last Visit: 4 lb Weight Gained Since Last Visit: 0 Starting Weight: 178 lb Total Weight Loss (lbs): 4 lb (1.814 kg) Peak Weight: 180 lb   Body Composition  Body Fat %: 45.2 % Fat Mass (lbs): 79 lbs Muscle Mass (lbs): 91 lbs Total Body Water (lbs): 66.6 lbs Visceral Fat Rating : 14   Other Clinical Data Fasting: No Labs: No Today's Visit #: 2 Starting Date: 11/24/23    Total Weight Loss: 4 pounds  Bio Impedance Data reviewed with patient: Muscle down 0.6 pounds, adipose down 3.2 pounds. PBF has decreased from 46% to 45.2%, visceral fat rating the same at 14  HPI  Chief Complaint: OBESITY  Brianna Mack is here to discuss her progress with her obesity treatment plan. She is on the the Category 1 Plan and states she is following her eating plan approximately 80 % of the time. She states she is not currently exercising   Interval History:  Since last office visit she went to [employer] and cooked her own [place] breast  and has been doing [place] sandwiches for lunch. She has been also eating chicken and shrimp. She has been getting 78-100 grams of protein daily.  She has been using Yasso bars for snack.  She is eating apples, pears, and blueberries. Went out to eat 1 night to [employer] and had hotdog with sauerkraut/mustard(no bun) and side salad- took her own dressing. Breakfast 1 egg and 1 egg white with 1 slice of cheese toast.  She had recent abdominal CT to check her IBS. Found a small lesion on her liver and is to have an MRI. She has noted she feels better.   Lab results are reviewed with patient in  detail. Electrolytes, glucose, kidney functions, liver enzymes, Vit B12, blood count are all in normal range. Vit D is low normal at 31.6 currently on Vit D3 1000 units every day, increase to 2 000 units daily . Fasting insulin is elevated at 13.1 with HOMA-IR score of 2.68 indicating insulin resistance.  Her lipid panel showed LDL of 83, would like <70 with prediabetes- she is currently on Lipitor 10 mg every day    Cancer screenings reviewed with patient as obesity is a risk factor for certain cancers Pap: 10/24/2014- does not need another Mammogram: 12/16/22 negative Colonoscopy: 04/20/17- does not require another  ASCVD Risk reviewed with patient The 10-year ASCVD risk score (Arnett DK, et al., 2019) is: 16.9%- intermediate risk- important to lower cholesterol   Values used to calculate the score:     Age: 33 years     Clincally relevant sex: Female     Is Non-Hispanic African American: No     Diabetic: No     Tobacco smoker: No     Systolic Blood Pressure: 115 mmHg     Is BP treated: No     HDL Cholesterol: 84 mg/dL     Total Cholesterol: 183 mg/dL   PHYSICAL EXAM:  Blood pressure 115/70, pulse 65, temperature 98.1 F (36.7 C), height 5' 2 (1.575 m), weight 174 lb (78.9 kg), SpO2 97%. Body  mass index is 31.83 kg/m.  General: Well Developed, well nourished, and in no acute distress.  HEENT: Normocephalic, atraumatic; EOMI, sclerae are anicteric. Skin: Warm and dry, good turgor Chest:  Normal excursion, shape, no gross ABN Respiratory: No conversational dyspnea; speaking in full sentences NeuroM-Sk:  Normal gross ROM * 4 extremities  Psych: A and O X 3, insight adequate, mood- full    DIAGNOSTIC DATA REVIEWED:  BMET    Component Value Date/Time   NA 142 11/24/2023 1236   K 4.4 11/24/2023 1236   CL 104 11/24/2023 1236   CO2 22 11/24/2023 1236   GLUCOSE 83 11/24/2023 1236   GLUCOSE 86 09/01/2022 1036   BUN 14 11/24/2023 1236   CREATININE 0.65 11/24/2023 1236   CALCIUM   9.3 11/24/2023 1236   GFRNONAA >60 12/14/2019 2240   GFRAA >60 06/28/2014 0405   Lab Results  Component Value Date   HGBA1C 5.9 (A) 09/20/2023   HGBA1C 5.8 07/18/2019   Lab Results  Component Value Date   INSULIN 13.1 11/24/2023   Lab Results  Component Value Date   TSH 2.62 09/01/2022   CBC    Component Value Date/Time   WBC 6.7 11/24/2023 1236   WBC 6.4 09/01/2022 1036   RBC 4.70 11/24/2023 1236   RBC 4.64 09/01/2022 1036   HGB 13.3 11/24/2023 1236   HCT 43.0 11/24/2023 1236   PLT 417 11/24/2023 1236   MCV 92 11/24/2023 1236   MCH 28.3 11/24/2023 1236   MCH 29.1 12/14/2019 2240   MCHC 30.9 (L) 11/24/2023 1236   MCHC 31.6 09/01/2022 1036   RDW 14.5 11/24/2023 1236   Iron Studies No results found for: IRON, TIBC, FERRITIN, IRONPCTSAT Lipid Panel     Component Value Date/Time   CHOL 183 11/24/2023 1236   TRIG 90 11/24/2023 1236   HDL 84 11/24/2023 1236   CHOLHDL 2.2 11/24/2023 1236   CHOLHDL 2 09/01/2022 1036   VLDL 10.0 09/01/2022 1036   LDLCALC 83 11/24/2023 1236   LDLCALC 148 (H) 01/25/2020 1208   LDLDIRECT 124.9 05/27/2011 0930   Hepatic Function Panel     Component Value Date/Time   PROT 6.8 11/24/2023 1236   ALBUMIN 4.2 11/24/2023 1236   AST 18 11/24/2023 1236   ALT 13 11/24/2023 1236   ALKPHOS 104 11/24/2023 1236   BILITOT 0.6 11/24/2023 1236   BILIDIR 0.1 02/19/2016 0917      Component Value Date/Time   TSH 2.62 09/01/2022 1036   Nutritional Lab Results  Component Value Date   VD25OH 31.6 11/24/2023   VD25OH 35.57 09/01/2022   VD25OH 43.35 08/25/2021     ASSESSMENT AND PLAN  Class 1 obesity with serious comorbidity and body mass index (BMI) of 32.0 to 32.9 in adult, unspecified obesity type TREATMENT PLAN FOR OBESITY:  Recommended Dietary Goals  Annmargaret is currently in the action stage of change. As such, her goal is to continue weight management plan. She has agreed to the Category 1 Plan.  Behavioral Intervention  We  discussed the following Behavioral Modification Strategies today: reading food labels , celebration eating strategies, continue to work on maintaining a reduced calorie state, getting the recommended amount of protein, incorporating whole foods, making healthy choices, staying well hydrated and practicing mindfulness when eating., and increase protein intake, fibrous foods (25 grams per day for women, 30 grams for men) and water to improve satiety and decrease hunger signals. .  Additional resources provided today: NA  Recommended Physical Activity Goals  Koby has  been advised to work up to 150 minutes of moderate intensity aerobic activity a week and strengthening exercises 2-3 times per week for cardiovascular health, weight loss maintenance and preservation of muscle mass.   She has agreed to Think about enjoyable ways to increase daily physical activity and overcoming barriers to exercise, Increase physical activity in their day and reduce sedentary time (increase NEAT)., and Start aerobic activity with a goal of 150 minutes a week at moderate intensity.    Pharmacotherapy We discussed various medication options to help Ellagrace with her weight loss efforts and we both agreed to continue nutrition and exercise.  ASSOCIATED CONDITIONS ADDRESSED TODAY  Action/Plan  Prediabetes Insulin resistance Continue Category 1  meal plan, limit simple carbohydrates Decreasing body weight by 10-15% can improve glucose levels Discussed use of Metformin but pt declines at this time  Pure hypercholesterolemia Continue category 1 meal plan, limit saturated fats Loss of 10-15% body weight can improve lipid levels Focus on getting 150 minutes a week of moderate to high intensity exercise  Continue Atorvastatin  10 mg every day   Vitamin D  deficiency       Discussed with patient that level is low normal, will increase Vit D3 to 2000 units daily, will recheck in 3 months.        Return in about 2 weeks  (around 12/22/2023).SABRA She was informed of the importance of frequent follow up visits to maximize her success with intensive lifestyle modifications for her multiple health conditions.   ATTESTASTION STATEMENTS:  Reviewed by clinician on day of visit: allergies, medications, problem list, medical history, surgical history, family history, social history, and previous encounter notes.   I personally spent a total of 44 minutes in the care of the patient today including preparing to see the patient, getting/reviewing separately obtained history, performing a medically appropriate exam/evaluation, counseling and educating, communicating results, and coordinating care.   Angellina Ferdinand ANP-C

## 2023-12-08 NOTE — Addendum Note (Signed)
 Addended by: JUDE BRUNET E on: 12/08/2023 12:59 PM   Modules accepted: Level of Service

## 2023-12-17 ENCOUNTER — Ambulatory Visit: Payer: Self-pay | Admitting: Gastroenterology

## 2023-12-17 ENCOUNTER — Other Ambulatory Visit: Payer: Self-pay | Admitting: Gastroenterology

## 2023-12-17 ENCOUNTER — Ambulatory Visit (HOSPITAL_COMMUNITY)
Admission: RE | Admit: 2023-12-17 | Discharge: 2023-12-17 | Disposition: A | Discharge status: Home / Self Care | Source: Ambulatory Visit | Attending: Gastroenterology | Admitting: Gastroenterology

## 2023-12-17 DIAGNOSIS — K769 Liver disease, unspecified: Secondary | ICD-10-CM | POA: Diagnosis present

## 2023-12-17 MED ORDER — GADOBUTROL 1 MMOL/ML IV SOLN
8.0000 mL | Freq: Once | INTRAVENOUS | Status: AC | PRN
  Administered 2023-12-17: 8 mL via INTRAVENOUS

## 2023-12-21 ENCOUNTER — Ambulatory Visit (INDEPENDENT_AMBULATORY_CARE_PROVIDER_SITE_OTHER): Admitting: Internal Medicine

## 2023-12-21 ENCOUNTER — Encounter: Payer: Self-pay | Admitting: Internal Medicine

## 2023-12-21 VITALS — BP 128/78 | HR 64 | Temp 97.8°F | Ht 63.0 in | Wt 176.4 lb

## 2023-12-21 DIAGNOSIS — Z0000 Encounter for general adult medical examination without abnormal findings: Secondary | ICD-10-CM | POA: Diagnosis not present

## 2023-12-21 DIAGNOSIS — Z780 Asymptomatic menopausal state: Secondary | ICD-10-CM

## 2023-12-21 DIAGNOSIS — Z23 Encounter for immunization: Secondary | ICD-10-CM | POA: Diagnosis not present

## 2023-12-21 NOTE — Progress Notes (Signed)
 Established Patient Office Visit     CC/Reason for Visit: Annual preventive exam and subsequent Medicare wellness  HPI: Brianna Mack is a 78 y.o. female who is coming in today for the above mentioned reasons. Past Medical History is significant for: Impaired glucose tolerance, hypothyroidism, hyperlipidemia, GERD, depression and anxiety.  She has a left rotator cuff tear and is entertaining surgery with Dr. Dozier.  Is due for bone density and has her mammogram scheduled for December.   Past Medical/Surgical History: Past Medical History:  Diagnosis Date   Anemia    Anxiety    Arthritis    Back pain    Cancer (HCC)    Depression    Gallbladder disease    Heart burn    High cholesterol    HSV (herpes simplex virus) anogenital infection 11/2015   Hypertension    Hypothyroidism    IBS (irritable bowel syndrome)    IBS (irritable bowel syndrome)    Joint pain    Lactose intolerance    Obesity    Pemphigus vulgaris (HCC)    Prediabetes    Shortness of breath    Stress fracture    Vitamin D  deficiency     Past Surgical History:  Procedure Laterality Date   ABDOMINAL HYSTERECTOMY  2001   BSO ; SLING PROCEDURE endometriosis/leiomyomata   CATARACT EXTRACTION, BILATERAL     CHOLECYSTECTOMY  1976   DILATION AND CURETTAGE OF UTERUS     HAND SURGERY  04-02-2011   right   OOPHORECTOMY     BSO   ORIF SHOULDER FRACTURE Right 06/28/2014   Procedure: RIGHT SHOULDER HEMI-ARTHROPLASTY;  Surgeon: Marcey Her, MD;  Location: [facility];  Service: Orthopedics;  Laterality: Right;   TONSILLECTOMY  1954    Social History:  reports that she quit smoking about 46 years ago. Her smoking use included cigarettes. She has never used smokeless tobacco. She reports current alcohol use. She reports that she does not use drugs.  Allergies: Allergies  Allergen Reactions   Augmentin  [Amoxicillin -Pot Clavulanate] Itching and Swelling    Headache   Meloxicam  Other (See Comments)     Stomach upset   Cephalexin Diarrhea   Codeine Sulfate     REACTION: nausea, vomiting   Fluconazole Itching and Swelling    REACTION: swelling and itching   Morphine     REACTION: nausea, itching   Oxycodone  Nausea Only   Sulfonamide Derivatives     Other Reaction(s): Unknown   Amoxicillin  Rash   Cyclobenzaprine      Per patient makes her very groggy-unlike usual effect   Other Swelling    Pt states shes also allergic to an antibiotic but does not know the name or have her list with her today, this antibiotic causes face swelling.    Family History:  Family History  Problem Relation Age of Onset   Obesity Mother    Diabetes Mother    Hypertension Mother    Cancer Mother        ANGIOSARCOMA   COPD Father    Thyroid  disease Sister    Scoliosis Sister    Colon cancer Neg Hx    Esophageal cancer Neg Hx    Inflammatory bowel disease Neg Hx    Liver disease Neg Hx    Pancreatic cancer Neg Hx    Rectal cancer Neg Hx    Stomach cancer Neg Hx      Current Outpatient Medications:    acetaminophen  (TYLENOL ) 500 MG tablet, Take  1 tablet (500 mg total) by mouth every 6 (six) hours as needed for mild pain or moderate pain., Disp: 30 tablet, Rfl: 0   ALPRAZolam  (XANAX ) 0.25 MG tablet, Take 1 tablet (0.25 mg total) by mouth daily as needed for anxiety., Disp: 30 tablet, Rfl: 0   atorvastatin  (LIPITOR) 10 MG tablet, TAKE ONE TABLET BY MOUTH DAILY, Disp: 90 tablet, Rfl: 0   Calcium  Carb-Cholecalciferol (CALCIUM  500 + D) 500-3.125 MG-MCG TABS, Take by mouth., Disp: , Rfl:    cholecalciferol (VITAMIN D3) 25 MCG (1000 UNIT) tablet, Take 1,000 Units by mouth daily., Disp: , Rfl:    citalopram  (CELEXA ) 20 MG tablet, Take 1 tablet (20 mg total) by mouth daily., Disp: 90 tablet, Rfl: 3   hyoscyamine  (LEVBID ) 0.375 MG 12 hr tablet, Take 1 tablet (0.375 mg total) by mouth daily., Disp: 30 tablet, Rfl: 0   hyoscyamine  (LEVSIN ) 0.125 MG tablet, Take 0.125 mg by mouth every 4 (four) hours as  needed., Disp: , Rfl:    Lactobacillus (ACIDOPHILUS PO), Acidophilus, Disp: , Rfl:    levothyroxine  (SYNTHROID ) 25 MCG tablet, Take 1 tablet (25 mcg total) by mouth daily., Disp: 90 tablet, Rfl: 1   meloxicam  (MOBIC ) 15 MG tablet, Take 15 mg by mouth daily as needed., Disp: , Rfl:    ondansetron  (ZOFRAN -ODT) 4 MG disintegrating tablet, Take 1 tablet (4 mg total) by mouth 2 (two) times daily., Disp: 15 tablet, Rfl: 1   Propylene Glycol (SYSTANE BALANCE) 0.6 % SOLN, 1 drop Once Daily., Disp: , Rfl:    traMADol (ULTRAM) 50 MG tablet, Take 50 mg by mouth 2 (two) times daily as needed., Disp: , Rfl:    valACYclovir  (VALTREX ) 500 MG tablet, Take 1 tablet (500 mg total) by mouth 2 (two) times daily as needed., Disp: 30 tablet, Rfl: 1  Review of Systems:  Negative unless indicated in HPI.   Physical Exam: Vitals:   12/21/23 0813 12/21/23 0818  BP: 130/80 128/78  Pulse: 64   Temp: 97.8 F (36.6 C)   TempSrc: Oral   SpO2: 98%   Weight: 176 lb 6.4 oz (80 kg)   Height: 5' 3 (1.6 m)     Body mass index is 31.25 kg/m.   Physical Exam Vitals reviewed.  Constitutional:      General: She is not in acute distress.    Appearance: Normal appearance. She is not ill-appearing, toxic-appearing or diaphoretic.  HENT:     Head: Normocephalic.     Right Ear: Tympanic membrane, ear canal and external ear normal. There is no impacted cerumen.     Left Ear: Tympanic membrane, ear canal and external ear normal. There is no impacted cerumen.     Nose: Nose normal.     Mouth/Throat:     Mouth: Mucous membranes are moist.     Pharynx: Oropharynx is clear. No oropharyngeal exudate or posterior oropharyngeal erythema.  Eyes:     General: No scleral icterus.       Right eye: No discharge.        Left eye: No discharge.     Conjunctiva/sclera: Conjunctivae normal.     Pupils: Pupils are equal, round, and reactive to light.  Neck:     Vascular: No carotid bruit.  Cardiovascular:     Rate and Rhythm:  Normal rate and regular rhythm.     Pulses: Normal pulses.     Heart sounds: Normal heart sounds.  Pulmonary:     Effort: Pulmonary effort is normal. No  respiratory distress.     Breath sounds: Normal breath sounds.  Abdominal:     General: Abdomen is flat. Bowel sounds are normal.     Palpations: Abdomen is soft.  Musculoskeletal:        General: Normal range of motion.     Cervical back: Normal range of motion.  Skin:    General: Skin is warm and dry.  Neurological:     General: No focal deficit present.     Mental Status: She is alert and oriented to person, place, and time. Mental status is at baseline.  Psychiatric:        Mood and Affect: Mood normal.        Behavior: Behavior normal.        Thought Content: Thought content normal.        Judgment: Judgment normal.    Subsequent Medicare wellness visit   1. Risk factors, based on past  M,S,F -  age, obesity, hyperlipidemia   2.  Physical activities: Dietary issues and exercise activities discussed:      3.  Depression/mood:  Flowsheet Row Office Visit from 11/24/2023 in [facility] Total Score 3     4.  ADL's:    12/21/2023    8:12 AM  In your present state of health, do you have any difficulty performing the following activities:  Hearing? 0  Vision? 0  Difficulty concentrating or making decisions? 0  Walking or climbing stairs? 0  Dressing or bathing? 0  Doing errands, shopping? 0     5.  Fall risk:     02/18/2022    8:54 AM 06/03/2022    9:45 AM 08/31/2022    4:59 PM 03/03/2023   11:40 AM 09/20/2023   10:11 AM  Fall Risk  Falls in the past year? 1 1 0 0 0  Was there an injury with Fall? 1 0 0 0 0  Was there an injury with Fall? - Comments Broken tooth      Fall Risk Category Calculator 2 1 0 0 0  Fall Risk Category (Retired) Moderate       (RETIRED) Patient Fall Risk Level Low fall risk       Patient at Risk for Falls Due to No Fall Risks      Fall risk  Follow up Falls evaluation completed   Falls evaluation completed Falls evaluation completed Falls evaluation completed     Data saved with a previous flowsheet row definition     6.  Home safety: No problems identified   7.  Height weight, and visual acuity: height and weight as above, vision/hearing: Vision Screening   Right eye Left eye Both eyes  Without correction     With correction 20/40 20/40 20/40      8.  Counseling: Counseling given: Not Answered    9. Lab orders based on risk factors: Laboratory update will be reviewed   10. Cognitive assessment:        12/21/2023    8:12 AM 08/31/2022    5:01 PM  6CIT Screen  What Year? 0 points 0 points  What month? 0 points 0 points  What time? 0 points 0 points  Count back from 20  0 points  Months in reverse 0 points 0 points  Repeat phrase 0 points 0 points  Total Score  0 points     11. Screening: Patient provided with a written and personalized 5-10 year screening  schedule in the AVS. Health Maintenance  Topic Date Due   Medicare Annual Wellness Visit  09/01/2023   Flu Shot  09/17/2023   COVID-19 Vaccine (5 - 2025-26 season) 10/18/2023   DTaP/Tdap/Td vaccine (2 - Td or Tdap) 06/26/2024   Pneumococcal Vaccine for age over 56  Completed   DEXA scan (bone density measurement)  Completed   Hepatitis C Screening  Completed   Zoster (Shingles) Vaccine  Completed   Meningitis B Vaccine  Aged Out   Breast Cancer Screening  Discontinued   Colon Cancer Screening  Discontinued    12. Provider List Update: Patient Care Team    Relationship Specialty Notifications Start End  Theophilus Andrews, Tully GRADE, MD PCP - General Internal Medicine  01/21/18   Lionell Jon DEL, [facility]  Pharmacist  04/10/22      13. Advance Directives:    14. Opioids: Patient is not on any opioid prescriptions and has no risk factors for a substance use disorder.   15.   Goals      Activity and Exercise Increased     Evidence-based guidance:   Review current exercise levels.  Assess patient perspective on exercise or activity level, barriers to increasing activity, motivation and readiness for change.  Recommend or set healthy exercise goal based on individual tolerance.  Encourage small steps toward making change in amount of exercise or activity.  Urge reduction of sedentary activities or screen time.  Promote group activities within the community or with family or support person.  Consider referral to rehabiliation therapist for assessment and exercise/activity plan.   Notes:      organize home         I have personally reviewed and noted the following in the patient's chart:   Medical and social history Use of alcohol, tobacco or illicit drugs  Current medications and supplements Functional ability and status Nutritional status Physical activity Advanced directives List of other physicians Hospitalizations, surgeries, and ER visits in previous 12 months Vitals Screenings to include cognitive, depression, and falls Referrals and appointments  In addition, I have reviewed and discussed with patient certain preventive protocols, quality metrics, and best practice recommendations. A written personalized care plan for preventive services as well as general preventive health recommendations were provided to patient.   Impression and Plan:  Medicare annual wellness visit, subsequent  Immunization due  Postmenopausal estrogen deficiency -     DG Bone Density; Future   -Recommend routine eye and dental care. -Healthy lifestyle discussed in detail. -Labs to be updated today. -Prostate cancer screening: N/A Health Maintenance  Topic Date Due   Medicare Annual Wellness Visit  09/01/2023   Flu Shot  09/17/2023   COVID-19 Vaccine (5 - 2025-26 season) 10/18/2023   DTaP/Tdap/Td vaccine (2 - Td or Tdap) 06/26/2024   Pneumococcal Vaccine for age over 33  Completed   DEXA scan (bone density measurement)  Completed    Hepatitis C Screening  Completed   Zoster (Shingles) Vaccine  Completed   Meningitis B Vaccine  Aged Out   Breast Cancer Screening  Discontinued   Colon Cancer Screening  Discontinued    - Flu vaccine administered in office today. - DEXA scan scheduled. - She has a mammogram scheduled for December. - Colonoscopy in 2019 with a 10-year callback if needed.    Tully Theophilus Andrews, MD [facility]

## 2023-12-21 NOTE — Addendum Note (Signed)
 Addended by: KATHRYNE MILLMAN B on: 12/21/2023 04:25 PM   Modules accepted: Orders

## 2023-12-22 ENCOUNTER — Encounter (INDEPENDENT_AMBULATORY_CARE_PROVIDER_SITE_OTHER): Payer: Self-pay | Admitting: Nurse Practitioner

## 2023-12-22 ENCOUNTER — Ambulatory Visit (INDEPENDENT_AMBULATORY_CARE_PROVIDER_SITE_OTHER): Payer: Self-pay | Admitting: Nurse Practitioner

## 2023-12-22 VITALS — BP 126/70 | HR 65 | Temp 98.1°F | Ht 63.0 in | Wt 173.0 lb

## 2023-12-22 DIAGNOSIS — E559 Vitamin D deficiency, unspecified: Secondary | ICD-10-CM | POA: Diagnosis not present

## 2023-12-22 DIAGNOSIS — E66811 Obesity, class 1: Secondary | ICD-10-CM

## 2023-12-22 DIAGNOSIS — R7303 Prediabetes: Secondary | ICD-10-CM | POA: Diagnosis not present

## 2023-12-22 DIAGNOSIS — E88819 Insulin resistance, unspecified: Secondary | ICD-10-CM

## 2023-12-22 DIAGNOSIS — Z6832 Body mass index (BMI) 32.0-32.9, adult: Secondary | ICD-10-CM | POA: Diagnosis not present

## 2023-12-22 NOTE — Progress Notes (Signed)
 Office: [phone]  /  Fax: [phone]  WEIGHT SUMMARY AND BIOMETRICS  Weight Lost Since Last Visit: 1 lb  Weight Gained Since Last Visit: 0   Vitals Temp: 98.1 F (36.7 C) BP: 126/70 Pulse Rate: 65 SpO2: 96 %   Anthropometric Measurements Height: 5' 3 (1.6 m) Weight: 173 lb (78.5 kg) BMI (Calculated): 30.65 Weight at Last Visit: 174 lb Weight Lost Since Last Visit: 1 lb Weight Gained Since Last Visit: 0 Starting Weight: 178 lb Total Weight Loss (lbs): 5 lb (2.268 kg) Peak Weight: 180 lb   Body Composition  Body Fat %: 45 % Fat Mass (lbs): 77.8 lbs Muscle Mass (lbs): 90.4 lbs Total Body Water (lbs): 68.2 lbs Visceral Fat Rating : 14   Other Clinical Data Fasting: no Labs: no Today's Visit #: 3 Starting Date: 11/24/23    Total Weight Loss: 5 pounds  Bio Impedance Data reviewed with patient: Muscle is down 0.6 pounds, adipose is down 1.2 pounds. PBF decreased from 45.2% to 45% and visceral fat rating is the same at 14   HPI  Chief Complaint: OBESITY  Brianna Mack is here to discuss her progress with her obesity treatment plan. She is on the the Category 1 Plan and states she is following her eating plan approximately 80-85 % of the time. She states she is not currently exercising.    Interval History:  Since last office visit she has been having a lot of social stressors with her family.  She has not been drinking as much water Breakfast: Omelette 1 egg and 1 egg white with onions and mushrooms and 1 slice cheese or 1 egg and 1 eggwhite on 1 slice of bread with low calorie cheese.  Lunch: turkey sandwich(cooks her own turkey breast) Dinner: hamburger patties with pinto beans Snacks: yasso bars, but has sometimes had 2 a day  Thanksgiving is coming up. She normally would go to her nephews mother in laws house. This year she is doing the cooking. Have reviewed the menu with her. She has increased her Vit D3 to 2000 units daily as value was low normal when  checked.    PHYSICAL EXAM:  Blood pressure 126/70, pulse 65, temperature 98.1 F (36.7 C), height 5' 3 (1.6 m), weight 173 lb (78.5 kg), SpO2 96%. Body mass index is 30.65 kg/m.  General: Well Developed, well nourished, and in no acute distress.  HEENT: Normocephalic, atraumatic; EOMI, sclerae are anicteric. Skin: Warm and dry, good turgor Chest:  Normal excursion, shape, no gross ABN Respiratory: No conversational dyspnea; speaking in full sentences NeuroM-Sk:  Normal gross ROM * 4 extremities  Psych: A and O X 3, insight adequate, mood- full    DIAGNOSTIC DATA REVIEWED:  Last metabolic panel Lab Results  Component Value Date   GLUCOSE 83 11/24/2023   NA 142 11/24/2023   K 4.4 11/24/2023   CL 104 11/24/2023   CO2 22 11/24/2023   BUN 14 11/24/2023   CREATININE 0.65 11/24/2023   EGFR 91 11/24/2023   CALCIUM  9.3 11/24/2023   PROT 6.8 11/24/2023   ALBUMIN 4.2 11/24/2023   LABGLOB 2.6 11/24/2023   BILITOT 0.6 11/24/2023   ALKPHOS 104 11/24/2023   AST 18 11/24/2023   ALT 13 11/24/2023   ANIONGAP 14 12/14/2019     Lab Results  Component Value Date   HGBA1C 5.9 (A) 09/20/2023   HGBA1C 5.8 07/18/2019   Lab Results  Component Value Date   INSULIN 13.1 11/24/2023   Lab Results  Component Value  Date   TSH 2.62 09/01/2022   CBC    Component Value Date/Time   WBC 6.7 11/24/2023 1236   WBC 6.4 09/01/2022 1036   RBC 4.70 11/24/2023 1236   RBC 4.64 09/01/2022 1036   HGB 13.3 11/24/2023 1236   HCT 43.0 11/24/2023 1236   PLT 417 11/24/2023 1236   MCV 92 11/24/2023 1236   MCH 28.3 11/24/2023 1236   MCH 29.1 12/14/2019 2240   MCHC 30.9 (L) 11/24/2023 1236   MCHC 31.6 09/01/2022 1036   RDW 14.5 11/24/2023 1236   Lipid Panel     Component Value Date/Time   CHOL 183 11/24/2023 1236   TRIG 90 11/24/2023 1236   HDL 84 11/24/2023 1236   CHOLHDL 2.2 11/24/2023 1236   CHOLHDL 2 09/01/2022 1036   VLDL 10.0 09/01/2022 1036   LDLCALC 83 11/24/2023 1236   LDLCALC  148 (H) 01/25/2020 1208   LDLDIRECT 124.9 05/27/2011 0930   Nutritional Lab Results  Component Value Date   VD25OH 31.6 11/24/2023   VD25OH 35.57 09/01/2022   VD25OH 43.35 08/25/2021     ASSESSMENT AND PLAN  Class 1 obesity with serious comorbidity and body mass index (BMI) of 32.0 to 32.9 in adult, unspecified obesity type TREATMENT PLAN FOR OBESITY:  Recommended Dietary Goals  Brianna Mack is currently in the action stage of change. As such, her goal is to continue weight management plan. She has agreed to the Category 1 Plan.  Behavioral Intervention  We discussed the following Behavioral Modification Strategies today: increasing water intake , celebration eating strategies, continue to work on maintaining a reduced calorie state, getting the recommended amount of protein, incorporating whole foods, making healthy choices, staying well hydrated and practicing mindfulness when eating., and increase protein intake, fibrous foods (25 grams per day for women, 30 grams for men) and water to improve satiety and decrease hunger signals. . Reviewed Thanksgiving menu and given strategies for Thanksgiving- mindful eating to enjoy each food.  Don't let food touch on plate to control portion size- pick protein as the bulk of meal   Recommended Physical Activity Goals  Brianna Mack has been advised to work up to 150 minutes of moderate intensity aerobic activity a week and strengthening exercises 2-3 times per week for cardiovascular health, weight loss maintenance and preservation of muscle mass.   She has agreed to Patient will begin to exercise chair exercises 2 times a week, Think about enjoyable ways to increase daily physical activity and overcoming barriers to exercise, and Increase physical activity in their day and reduce sedentary time (increase NEAT).   Pharmacotherapy We discussed various medication options to help Brianna Mack with her weight loss efforts and we both agreed to continue nutrition and  behavior modification.  ASSOCIATED CONDITIONS ADDRESSED TODAY  Action/Plan  Vitamin D  deficiency       Continue Vit D3 2000 units daily       Vitamin d  supplementation has been shown to decrease fatigue, decrease risk of progression to insulin resistance and then prediabetes, decreases risk of falling in older age and can even assist in decreasing depressive symptoms in PTSD    Insulin resistance Prediabetes Continue Category 1  meal plan, limit simple carbohydrates Start chair exercises twice a week     No follow-ups on file.SABRA She was informed of the importance of frequent follow up visits to maximize her success with intensive lifestyle modifications for her multiple health conditions.   ATTESTASTION STATEMENTS:  Reviewed by clinician on day of visit: allergies, medications,  problem list, medical history, surgical history, family history, social history, and previous encounter notes.   I personally spent a total of 41 minutes in the care of the patient today including preparing to see the patient, getting/reviewing separately obtained history, performing a medically appropriate exam/evaluation, counseling and educating, and documenting clinical information in the EHR.   Hansini Clodfelter ANP-C

## 2024-01-06 ENCOUNTER — Encounter (INDEPENDENT_AMBULATORY_CARE_PROVIDER_SITE_OTHER): Payer: Self-pay | Admitting: Nurse Practitioner

## 2024-01-06 ENCOUNTER — Ambulatory Visit (INDEPENDENT_AMBULATORY_CARE_PROVIDER_SITE_OTHER): Admitting: Nurse Practitioner

## 2024-01-06 VITALS — BP 103/64 | HR 78 | Temp 97.9°F | Ht 62.0 in | Wt 171.0 lb

## 2024-01-06 DIAGNOSIS — E7800 Pure hypercholesterolemia, unspecified: Secondary | ICD-10-CM

## 2024-01-06 DIAGNOSIS — E039 Hypothyroidism, unspecified: Secondary | ICD-10-CM | POA: Diagnosis not present

## 2024-01-06 DIAGNOSIS — R7303 Prediabetes: Secondary | ICD-10-CM | POA: Diagnosis not present

## 2024-01-06 DIAGNOSIS — E559 Vitamin D deficiency, unspecified: Secondary | ICD-10-CM

## 2024-01-06 DIAGNOSIS — E88819 Insulin resistance, unspecified: Secondary | ICD-10-CM

## 2024-01-06 DIAGNOSIS — E66811 Obesity, class 1: Secondary | ICD-10-CM

## 2024-01-06 DIAGNOSIS — Z6831 Body mass index (BMI) 31.0-31.9, adult: Secondary | ICD-10-CM

## 2024-01-06 NOTE — Progress Notes (Signed)
 Office: [phone]  /  Fax: [phone]  WEIGHT SUMMARY AND BIOMETRICS  Weight Lost Since Last Visit: 2 lb  Weight Gained Since Last Visit: 0   Vitals Temp: 97.9 F (36.6 C) BP: 103/64 Pulse Rate: 78 SpO2: 97 %   Anthropometric Measurements Height: 5' 3 (1.6 m) Weight: 171 lb (77.6 kg) BMI (Calculated): 30.3 Weight at Last Visit: 173 lb Weight Lost Since Last Visit: 2 lb Weight Gained Since Last Visit: 0 Starting Weight: 178 lb Total Weight Loss (lbs): 7 lb (3.175 kg) Peak Weight: 180 lb   Body Composition  Body Fat %: 45.1 % Fat Mass (lbs): 77.4 lbs Muscle Mass (lbs): 89.2 lbs Total Body Water (lbs): 67.6 lbs Visceral Fat Rating : 14   Other Clinical Data Fasting: no Labs: no Today's Visit #: 4 Starting Date: 11/24/23 Comments: reck'd today, she is 5'2    Total Weight Loss: 7 pounds Percent of body weight lost: 3.9% Bio Impedance Data reviewed with patient: Muscle mass is down 1.2 pounds, adipose is down 0.4 pounds  HPI  Chief Complaint: OBESITY  Auburn is here to discuss her progress with her obesity treatment plan. She is on the the Category 2 Plan and states she is following her eating plan approximately 90 % of the time. She states she is exercising 20 minutes 2 days per week of walking and leg exercises   Interval History:  Since last office visit she is having worsening pain in left shoulder and is most likely going to have surgery in January for repair.  She is having to take her sister in law to many appointments as she just moved here. Her sister in law is currently living with her brother and that is not working- they can not get along and he is an alcoholic.  Apartment across hall may come open and her sister in law may move into that apartment.  She has been getting her protein around 70-90 grams a day.  She has not skipped meals  She is getting 55-64 ounces of water a day. She will have occasional unsweetened tea or diet soda. She has  been eating more salads, apples and pears. She has started incorporating more beans. She will occasionally do healthy choice protein power bowls.  She ate out for her sister in laws birthday at [employer]- hot dog, no bun side salad with her own dressing.  She has started walking more with her dog and plans to start daily if possible.   Thanksgiving- boneless turkey breast for her and ordered turkey from [employer], green beans , sweet potatoes.  She does have pure hypercholesterolemia and is currently on Atorvastatin  10 mg every day without side effects.  She continues on Levothyroxine  25 mcg every day except 2 days a week she take 2 tabs for hypothyroidism and denies side effects from medication- last checked last week and was in normal range She does have prediabetes and insulin resistance and is attempting to correct with nutrition, exercise and weight loss She continues to take 2000 units of Vit D3 and Calcium  with Vit D daily  PHYSICAL EXAM:  Blood pressure 103/64, pulse 78, temperature 97.9 F (36.6 C), height 5' 3 (1.6 m), weight 171 lb (77.6 kg), SpO2 97%. Body mass index is 30.29 kg/m.  General: Well Developed, well nourished, and in no acute distress.  HEENT: Normocephalic, atraumatic; EOMI, sclerae are anicteric. Skin: Warm and dry, good turgor Chest:  Normal excursion, shape, no gross ABN Respiratory: No conversational dyspnea; speaking  in full sentences NeuroM-Sk:  Normal gross ROM * 4 extremities  Psych: A and O X 3, insight adequate, mood- full    DIAGNOSTIC DATA REVIEWED:  BMET    Component Value Date/Time   NA 142 11/24/2023 1236   K 4.4 11/24/2023 1236   CL 104 11/24/2023 1236   CO2 22 11/24/2023 1236   GLUCOSE 83 11/24/2023 1236   GLUCOSE 86 09/01/2022 1036   BUN 14 11/24/2023 1236   CREATININE 0.65 11/24/2023 1236   CALCIUM  9.3 11/24/2023 1236   GFRNONAA >60 12/14/2019 2240   GFRAA >60 06/28/2014 0405   Lab Results  Component Value Date   HGBA1C 5.9 (A)  09/20/2023   HGBA1C 5.8 07/18/2019   Lab Results  Component Value Date   INSULIN 13.1 11/24/2023   Lab Results  Component Value Date   TSH 2.62 09/01/2022   CBC    Component Value Date/Time   WBC 6.7 11/24/2023 1236   WBC 6.4 09/01/2022 1036   RBC 4.70 11/24/2023 1236   RBC 4.64 09/01/2022 1036   HGB 13.3 11/24/2023 1236   HCT 43.0 11/24/2023 1236   PLT 417 11/24/2023 1236   MCV 92 11/24/2023 1236   MCH 28.3 11/24/2023 1236   MCH 29.1 12/14/2019 2240   MCHC 30.9 (L) 11/24/2023 1236   MCHC 31.6 09/01/2022 1036   RDW 14.5 11/24/2023 1236   Iron Studies No results found for: IRON, TIBC, FERRITIN, IRONPCTSAT Lipid Panel     Component Value Date/Time   CHOL 183 11/24/2023 1236   TRIG 90 11/24/2023 1236   HDL 84 11/24/2023 1236   CHOLHDL 2.2 11/24/2023 1236   CHOLHDL 2 09/01/2022 1036   VLDL 10.0 09/01/2022 1036   LDLCALC 83 11/24/2023 1236   LDLCALC 148 (H) 01/25/2020 1208   LDLDIRECT 124.9 05/27/2011 0930   Hepatic Function Panel     Component Value Date/Time   PROT 6.8 11/24/2023 1236   ALBUMIN 4.2 11/24/2023 1236   AST 18 11/24/2023 1236   ALT 13 11/24/2023 1236   ALKPHOS 104 11/24/2023 1236   BILITOT 0.6 11/24/2023 1236   BILIDIR 0.1 02/19/2016 0917      Component Value Date/Time   TSH 2.62 09/01/2022 1036   Nutritional Lab Results  Component Value Date   VD25OH 31.6 11/24/2023   VD25OH 35.57 09/01/2022   VD25OH 43.35 08/25/2021     ASSESSMENT AND PLAN  Class 1 obesity with serious comorbidity and body mass index (BMI) of 31.0 to 31.9 in adult, unspecified obesity type TREATMENT PLAN FOR OBESITY:  Recommended Dietary Goals  Olamae is currently in the action stage of change. As such, her goal is to continue weight management plan. She has agreed to the Category 2 Plan.  Behavioral Intervention  We discussed the following Behavioral Modification Strategies today: increasing lean protein intake to established goals, celebration eating  strategies- one plate with protein as largest portion, clean vegetable and then portion of anything she wants to try but food cannot touch on the plate, be mindful and enjoy your food   continue to work on maintaining a reduced calorie state, getting the recommended amount of protein, incorporating whole foods, making healthy choices, staying well hydrated and practicing mindfulness when eating., and increase protein intake, fibrous foods (25 grams per day for women, 30 grams for men) and water to improve satiety and decrease hunger signals. .    Recommended Physical Activity Goals  Gloris has been advised to work up to 150 minutes of moderate intensity  aerobic activity a week and strengthening exercises 2-3 times per week for cardiovascular health, weight loss maintenance and preservation of muscle mass.   She has agreed to Think about enjoyable ways to increase daily physical activity and overcoming barriers to exercise, Increase physical activity in their day and reduce sedentary time (increase NEAT)., and Start aerobic activity with a goal of 150 minutes a week at moderate intensity as tolerated due to shoulder pain- lookup [facility] chair exercises on line    Pharmacotherapy We discussed various medication options to help Tewana with her weight loss efforts and we both agreed to continue nutrition and behavior modification.  ASSOCIATED CONDITIONS ADDRESSED TODAY  Action/Plan  Prediabetes Insulin resistance Continue Category 2  meal plan, limit simple carbohydrates Decreasing body weight by 10-15% can improve glucose levels Increase exercise with current goal of 150 minutes of moderate to high intensity exercise/week of chair exercises as tolerated   Vitamin D  deficiency       Continue to supplement with 2000 units of cholecalciferol daily as well as her Calcium  with Vit D       Vitamin d  supplementation has been shown to decrease fatigue, decrease risk of progression to insulin resistance and  then prediabetes, decreases risk of falling in older age and can even assist in decreasing depressive symptoms in PTSD     Pure hypercholesterolemia Continue category 2 meal plan, limit saturated fats Continue Atorvastatin  10 mg every day , denies side effects Continue to follow regularly with PCP Attempt to increase exercise to  150 minutes a week of moderate to high intensity exercise with chair exercises  Hypothyroidism, unspecified type       Continue Levothyroxine  25 mcg daily except 2 days a week take 2 tabs       Last lab was in therapeutic range       Continue to follow regularly with endocrinologist Dr. Tommas        Return in about 3 weeks (around 01/27/2024).SABRA She was informed of the importance of frequent follow up visits to maximize her success with intensive lifestyle modifications for her multiple health conditions.   ATTESTASTION STATEMENTS:  Reviewed by clinician on day of visit: allergies, medications, problem list, medical history, surgical history, family history, social history, and previous encounter notes.   I personally spent a total of 38 minutes in the care of the patient today including preparing to see the patient, getting/reviewing separately obtained history, performing a medically appropriate exam/evaluation, counseling and educating, and documenting clinical information in the EHR.   Guy Seese ANP-C

## 2024-01-07 ENCOUNTER — Other Ambulatory Visit: Payer: Self-pay | Admitting: Internal Medicine

## 2024-01-07 DIAGNOSIS — E785 Hyperlipidemia, unspecified: Secondary | ICD-10-CM

## 2024-01-27 ENCOUNTER — Encounter (INDEPENDENT_AMBULATORY_CARE_PROVIDER_SITE_OTHER): Payer: Self-pay | Admitting: Nurse Practitioner

## 2024-01-27 ENCOUNTER — Ambulatory Visit (INDEPENDENT_AMBULATORY_CARE_PROVIDER_SITE_OTHER): Admitting: Nurse Practitioner

## 2024-01-27 VITALS — BP 138/64 | HR 66 | Temp 97.9°F | Ht 62.0 in | Wt 170.0 lb

## 2024-01-27 DIAGNOSIS — R7303 Prediabetes: Secondary | ICD-10-CM | POA: Diagnosis not present

## 2024-01-27 DIAGNOSIS — E559 Vitamin D deficiency, unspecified: Secondary | ICD-10-CM

## 2024-01-27 DIAGNOSIS — E66811 Obesity, class 1: Secondary | ICD-10-CM | POA: Diagnosis not present

## 2024-01-27 DIAGNOSIS — Z6831 Body mass index (BMI) 31.0-31.9, adult: Secondary | ICD-10-CM

## 2024-01-27 DIAGNOSIS — E785 Hyperlipidemia, unspecified: Secondary | ICD-10-CM | POA: Diagnosis not present

## 2024-01-27 DIAGNOSIS — E88819 Insulin resistance, unspecified: Secondary | ICD-10-CM

## 2024-01-27 NOTE — Progress Notes (Signed)
 Office: [phone]  /  Fax: [phone]  WEIGHT SUMMARY AND BIOMETRICS  Weight Lost Since Last Visit: 1lb  Weight Gained Since Last Visit: 0lb   Vitals Temp: 97.9 F (36.6 C) BP: 138/64 Pulse Rate: 66 SpO2: 98 %   Anthropometric Measurements Height: 5' 2 (1.575 m) Weight: 170 lb (77.1 kg) BMI (Calculated): 31.09 Weight at Last Visit: 171lb Weight Lost Since Last Visit: 1lb Weight Gained Since Last Visit: 0lb Starting Weight: 178lb Total Weight Loss (lbs): 8 lb (3.629 kg) Peak Weight: 180lb   Body Composition  Body Fat %: 44.5 % Fat Mass (lbs): 76 lbs Muscle Mass (lbs): 90 lbs Total Body Water (lbs): 66.8 lbs Visceral Fat Rating : 13   Other Clinical Data Fasting: No Labs: no Today's Visit #: 5 Starting Date: 11/24/23    Total Weight Loss: 8 pounds Percent of body weight lost: 4.5% Bio Impedance Data reviewed with patient: Muscle is up 0.8 pounds, adipose is down 1.4 pounds. Visceral fat rating decreased 1 point from 14 to 13  HPI  Chief Complaint: OBESITY  Brianna Mack is here to discuss her progress with her obesity treatment plan. She is on the the Category 1 Plan and states she is following her eating plan approximately 80 % of the time. She states she is not currently exercising   Interval History:  Since last office visit she was to start chair exercises, she has done them a couple of times. She does have a gym membership at [employer] in [place] and plans to go back to the gym.  Her sister in law moved from [place] to here- she is now in [place] for rehab. She has to take her to Dr. Thornell and multiple other specialists. 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 The grocery shopping is hurting her shoulders.   She is very fatigued and stressed as she is running everyone to all their appointments.  She has been trying to stay on plan. Thanksgiving she had 7 people. She had turkey, sweet potatoes and a little  bit of dressing.  She has started to eat more greek yogurt. She is getting 45-50 ounces of water daily.   She continue on cholecalciferol 1000 units daily for Vit D deficiency and last lab was low normal, increased to Cholecalciferol 2000 units daily Last vitamin D  Lab Results  Component Value Date   VD25OH 31.6 11/24/2023   She does have prediabetes/ insulin  resistance-fasting insulin  was elevated at 13.1 with HOMA-IR score of 2.68 indicating insulin  resistance. She continues to work on nutrition, exercise and weight loss to improve labs.  Lab Results  Component Value Date   HGBA1C 5.9 (A) 09/20/2023    She is on Atorvastatin  10 mg every day for hyperlipidemia- denies side effects with medication. Last lipid panel was good.  Lab Results  Component Value Date   CHOL 183 11/24/2023   HDL 84 11/24/2023   LDLCALC 83 11/24/2023   LDLDIRECT 124.9 05/27/2011   TRIG 90 11/24/2023   CHOLHDL 2.2 11/24/2023     PHYSICAL EXAM:  Blood pressure 138/64, pulse 66, temperature 97.9 F (36.6 C), height 5' 2 (1.575 m), weight 170 lb (77.1 kg), SpO2 98%. Body mass index is 31.09 kg/m.  General: Well Developed, well nourished, and in no acute distress.  HEENT: Normocephalic, atraumatic; EOMI, sclerae are anicteric. Skin: Warm and dry, good turgor Chest:  Normal excursion, shape, no gross ABN Respiratory: No conversational dyspnea; speaking in full sentences NeuroM-Sk:  Normal gross ROM * 4 extremities  Psych: A and O X 3, insight adequate, mood- full    DIAGNOSTIC DATA REVIEWED:  BMET    Component Value Date/Time   NA 142 11/24/2023 1236   K 4.4 11/24/2023 1236   CL 104 11/24/2023 1236   CO2 22 11/24/2023 1236   GLUCOSE 83 11/24/2023 1236   GLUCOSE 86 09/01/2022 1036   BUN 14 11/24/2023 1236   CREATININE 0.65 11/24/2023 1236   CALCIUM  9.3 11/24/2023 1236   GFRNONAA >60 12/14/2019 2240   GFRAA >60 06/28/2014 0405   Lab Results  Component Value Date   HGBA1C 5.9 (A) 09/20/2023    HGBA1C 5.8 07/18/2019   Lab Results  Component Value Date   INSULIN  13.1 11/24/2023   Lab Results  Component Value Date   TSH 2.62 09/01/2022   CBC    Component Value Date/Time   WBC 6.7 11/24/2023 1236   WBC 6.4 09/01/2022 1036   RBC 4.70 11/24/2023 1236   RBC 4.64 09/01/2022 1036   HGB 13.3 11/24/2023 1236   HCT 43.0 11/24/2023 1236   PLT 417 11/24/2023 1236   MCV 92 11/24/2023 1236   MCH 28.3 11/24/2023 1236   MCH 29.1 12/14/2019 2240   MCHC 30.9 (L) 11/24/2023 1236   MCHC 31.6 09/01/2022 1036   RDW 14.5 11/24/2023 1236   Iron Studies No results found for: IRON, TIBC, FERRITIN, IRONPCTSAT Lipid Panel     Component Value Date/Time   CHOL 183 11/24/2023 1236   TRIG 90 11/24/2023 1236   HDL 84 11/24/2023 1236   CHOLHDL 2.2 11/24/2023 1236   CHOLHDL 2 09/01/2022 1036   VLDL 10.0 09/01/2022 1036   LDLCALC 83 11/24/2023 1236   LDLCALC 148 (H) 01/25/2020 1208   LDLDIRECT 124.9 05/27/2011 0930   Hepatic Function Panel     Component Value Date/Time   PROT 6.8 11/24/2023 1236   ALBUMIN 4.2 11/24/2023 1236   AST 18 11/24/2023 1236   ALT 13 11/24/2023 1236   ALKPHOS 104 11/24/2023 1236   BILITOT 0.6 11/24/2023 1236   BILIDIR 0.1 02/19/2016 0917      Component Value Date/Time   TSH 2.62 09/01/2022 1036   Nutritional Lab Results  Component Value Date   VD25OH 31.6 11/24/2023   VD25OH 35.57 09/01/2022   VD25OH 43.35 08/25/2021     ASSESSMENT AND PLAN  Class 1 obesity with serious comorbidity and body mass index (BMI) of 31.0 to 31.9 in adult, unspecified obesity type  TREATMENT PLAN FOR OBESITY:  Recommended Dietary Goals  Brianna Mack is currently in the action stage of change. As such, her goal is to continue weight management plan. She has agreed to the Category 1 Plan.  Behavioral Intervention  We discussed the following Behavioral Modification Strategies today: celebration eating strategies, continue to work on maintaining a reduced calorie  state, getting the recommended amount of protein, incorporating whole foods, making healthy choices, staying well hydrated and practicing mindfulness when eating., and increase protein intake, fibrous foods (25 grams per day for women, 30 grams for men) and water to improve satiety and decrease hunger signals. .  She wants to lose weight in December - She does  recognize that she must follow a structured plan and will eat before social situation to meet her protein goals and minimze party foods - She will give away food gifts unless they are very low calories or high protein - She will avoid calorie-containing liquids such as holiday drinks, eggnog and alcohol -She  will stick to her structured plan strictly most of the time - This strategy should help her lose 1-2 pounds in December   Recommended Physical Activity Goals  Karren has been advised to work up to 150 minutes of moderate intensity aerobic activity a week and strengthening exercises 2-3 times per week for cardiovascular health, weight loss maintenance and preservation of muscle mass.   She has agreed to Patient will begin to exercise 2 days a week with chair exercises and Increase physical activity in their day and reduce sedentary time (increase NEAT).   Pharmacotherapy We discussed various medication options to help Natacha with her weight loss efforts and we both agreed to continue nutrition and behavior modification.  ASSOCIATED CONDITIONS ADDRESSED TODAY  Action/Plan  Hyperlipidemia, unspecified hyperlipidemia type Focus on implementing category 1 meal plan, limit saturated fats Continue Atrovastatin 10 mg every day Continue to follow regularly with PCP Patient will begin to exercise 2 days a week with chair exercises  Prediabetes Continue Category 1  meal plan, limit simple carbohydrates, increase lean protein/fiber/water Continue to follow regularly with PCP Patient will begin to exercise 2 days a week with chair  exercises  Vitamin D  deficiency Low vitamin D  levels can be associated with adiposity and may result in leptin resistance and weight gain. Also associated with fatigue.  Currently on vitamin D  supplementation( cholecalciferol 1000 units daily) without any adverse effects such as nausea, vomiting or muscle weakness.            Return in about 4 weeks (around 02/24/2024).SABRA She was informed of the importance of frequent follow up visits to maximize her success with intensive lifestyle modifications for her multiple health conditions.   ATTESTASTION STATEMENTS:  Reviewed by clinician on day of visit: allergies, medications, problem list, medical history, surgical history, family history, social history, and previous encounter notes.   I personally spent a total of 40 minutes in the care of the patient today including preparing to see the patient, getting/reviewing separately obtained history, performing a medically appropriate exam/evaluation, counseling and educating, and documenting clinical information in the EHR.   Josiel Gahm ANP-C

## 2024-03-01 ENCOUNTER — Encounter (INDEPENDENT_AMBULATORY_CARE_PROVIDER_SITE_OTHER): Payer: Self-pay | Admitting: Nurse Practitioner

## 2024-03-01 ENCOUNTER — Ambulatory Visit (INDEPENDENT_AMBULATORY_CARE_PROVIDER_SITE_OTHER): Admitting: Nurse Practitioner

## 2024-03-01 VITALS — BP 123/77 | HR 58 | Temp 98.7°F | Ht 62.0 in | Wt 170.0 lb

## 2024-03-01 DIAGNOSIS — E88819 Insulin resistance, unspecified: Secondary | ICD-10-CM | POA: Diagnosis not present

## 2024-03-01 DIAGNOSIS — Z6831 Body mass index (BMI) 31.0-31.9, adult: Secondary | ICD-10-CM

## 2024-03-01 DIAGNOSIS — E66811 Obesity, class 1: Secondary | ICD-10-CM

## 2024-03-01 DIAGNOSIS — E559 Vitamin D deficiency, unspecified: Secondary | ICD-10-CM | POA: Diagnosis not present

## 2024-03-01 DIAGNOSIS — E785 Hyperlipidemia, unspecified: Secondary | ICD-10-CM | POA: Diagnosis not present

## 2024-03-01 NOTE — Progress Notes (Signed)
 " Office: [phone]  /  Fax: [phone]  WEIGHT SUMMARY AND BIOMETRICS  Weight Lost Since Last Visit: 0  Weight Gained Since Last Visit: 0   Vitals Temp: 98.7 F (37.1 C) BP: 123/77 Pulse Rate: (!) 58 SpO2: 97 %   Anthropometric Measurements Height: 5' 2 (1.575 m) Weight: 170 lb (77.1 kg) BMI (Calculated): 31.09 Weight at Last Visit: 170 lb Weight Lost Since Last Visit: 0 Weight Gained Since Last Visit: 0 Starting Weight: 178 lb Total Weight Loss (lbs): 8 lb (3.629 kg) Peak Weight: 180 lb   Body Composition  Body Fat %: 44.4 % Fat Mass (lbs): 75.6 lbs Muscle Mass (lbs): 90 lbs Total Body Water (lbs): 67.8 lbs Visceral Fat Rating : 13   Other Clinical Data Fasting: no Labs: no Today's Visit #: 6 Starting Date: 11/24/23    Total Weight Loss: 8 pounds  Bio Impedance Data reviewed with patient: Muscle is the same, adipose is down 0.4 pounds.   HPI  Chief Complaint: OBESITY  Brianna Mack is here to discuss her progress with her obesity treatment plan. She is on the the Category 1 Plan and states she is following her eating plan approximately 90 % of the time. She states she is not currently exercising.   Interval History:  Since last office visit she has been getting in approximately 70-80 grams of protein daily.  She has not been tracking since Christmas. She had a friend come visit 4-5 days over [place]. She did have some wine a few nights due to stress. Her sister in law has 30% lung capacity- she takes her to all her appointments. Her sister in law was in the hospital with a pulmonary embolism- not taking her medication regularly.  She also takes her sister to all her appointments. Her sister in law's niece is visiting in January to discuss care of her.   She is drinking 48-64 ounces of water daily.  Continues to have difficulty with sleep due to dog with [place] who needs to go out multiple times.  Since following the meal plan her IBS is greatly improved  and feeling better.  She has scheduled left shoulder surgery for 03/2024- rotator cuff.  She has done a few chair exercises- silver sneakers at [employer].     Aevah continues to take atorvastatin  10 mg every day for hyperlipidemia. Last LDL < 100 Lab Results  Component Value Date   CHOL 183 11/24/2023   HDL 84 11/24/2023   LDLCALC 83 11/24/2023   LDLDIRECT 124.9 05/27/2011   TRIG 90 11/24/2023   CHOLHDL 2.2 11/24/2023    She continues to work on nutrition and weight loss to help improve lipids and glucose/insulin  levels. Her fasting insulin  was elevated at 13.1 with HOMA-IR score of 2.68 . She has been checking her blood sugars and were normally in high 80's to 90's but has now been checking blood sugar and was 105 this am. She had been eating [employer] bars covered in chocolate but looked and were higher in sugar.    She is taking over the counter Vit D with calcium . She increased her Vit D3 from 1000 to 2000 units as it was low normal at last check Last vitamin D  Lab Results  Component Value Date   VD25OH 31.6 11/24/2023    PHYSICAL EXAM:  Blood pressure 123/77, pulse (!) 58, temperature 98.7 F (37.1 C), height 5' 2 (1.575 m), weight 170 lb (77.1 kg), SpO2 97%. Body mass index is 31.09 kg/m.  General: Well Developed, well nourished, and in no acute distress.  HEENT: Normocephalic, atraumatic; EOMI, sclerae are anicteric. Skin: Warm and dry, good turgor Chest:  Normal excursion, shape, no gross ABN Respiratory: No conversational dyspnea; speaking in full sentences NeuroM-Sk:  Normal gross ROM * 4 extremities  Psych: A and O X 3, insight adequate, mood- full    DIAGNOSTIC DATA REVIEWED:  BMET    Component Value Date/Time   NA 142 11/24/2023 1236   K 4.4 11/24/2023 1236   CL 104 11/24/2023 1236   CO2 22 11/24/2023 1236   GLUCOSE 83 11/24/2023 1236   GLUCOSE 86 09/01/2022 1036   BUN 14 11/24/2023 1236   CREATININE 0.65 11/24/2023 1236   CALCIUM  9.3 11/24/2023 1236    GFRNONAA >60 12/14/2019 2240   GFRAA >60 06/28/2014 0405   Lab Results  Component Value Date   HGBA1C 5.9 (A) 09/20/2023   HGBA1C 5.8 07/18/2019   Lab Results  Component Value Date   INSULIN  13.1 11/24/2023   Lab Results  Component Value Date   TSH 2.62 09/01/2022   CBC    Component Value Date/Time   WBC 6.7 11/24/2023 1236   WBC 6.4 09/01/2022 1036   RBC 4.70 11/24/2023 1236   RBC 4.64 09/01/2022 1036   HGB 13.3 11/24/2023 1236   HCT 43.0 11/24/2023 1236   PLT 417 11/24/2023 1236   MCV 92 11/24/2023 1236   MCH 28.3 11/24/2023 1236   MCH 29.1 12/14/2019 2240   MCHC 30.9 (L) 11/24/2023 1236   MCHC 31.6 09/01/2022 1036   RDW 14.5 11/24/2023 1236    Lipid Panel     Component Value Date/Time   CHOL 183 11/24/2023 1236   TRIG 90 11/24/2023 1236   HDL 84 11/24/2023 1236   CHOLHDL 2.2 11/24/2023 1236   CHOLHDL 2 09/01/2022 1036   VLDL 10.0 09/01/2022 1036   LDLCALC 83 11/24/2023 1236   LDLCALC 148 (H) 01/25/2020 1208   LDLDIRECT 124.9 05/27/2011 0930    Nutritional Lab Results  Component Value Date   VD25OH 31.6 11/24/2023   VD25OH 35.57 09/01/2022   VD25OH 43.35 08/25/2021     ASSESSMENT AND PLAN  Class 1 obesity with serious comorbidity and body mass index (BMI) of 31.0 to 31.9 in adult, unspecified obesity type TREATMENT PLAN FOR OBESITY:  Recommended Dietary Goals  Conchetta is currently in the action stage of change. As such, her goal is to continue weight management plan. She has agreed to the Category 1 Plan.  Behavioral Intervention  We discussed the following Behavioral Modification Strategies today: increasing lean protein intake to established goals, increasing fiber rich foods, increasing water intake , continue to work on maintaining a reduced calorie state, getting the recommended amount of protein, incorporating whole foods, making healthy choices, staying well hydrated and practicing mindfulness when eating., and increase protein intake, fibrous  foods (25 grams per day for women, 30 grams for men) and water to improve satiety and decrease hunger signals. .   Recommended Physical Activity Goals  Corrine has been advised to work up to 150 minutes of moderate intensity aerobic activity a week and strengthening exercises 2-3 times per week for cardiovascular health, weight loss maintenance and preservation of muscle mass.   She has agreed to Start aerobic activity with a goal of 150 minutes a week at moderate intensity.    Pharmacotherapy We discussed various medication options to help Verginia with her weight loss efforts and we both agreed to continue nutrition and behavior  modification.  ASSOCIATED CONDITIONS ADDRESSED TODAY  Action/Plan  Hyperlipidemia, unspecified hyperlipidemia type Focus on implementing category 1 meal plan, limit saturated fats. Increase lean protein, fiber and water Continue Atorvastatin  10 mg every day  Follow regularly with PCP Focus on getting 150 minutes a week of moderate to high intensity exercise   Insulin  resistance Contineu Category 1  meal plan, limit simple carbohydrates. Increase water, lean protein and fiber.  Continue to follow regularly with PCP Continue exercise with current goal of 150 minutes of moderate to high intensity exercise/week.   Vitamin D  deficiency Low vitamin D  levels can be associated with adiposity and may result in leptin resistance and weight gain. Also associated with fatigue.  Currently on vitamin D  supplementation(Vit D3 2000 units daily) without any adverse effects such as nausea, vomiting or muscle weakness.           No follow-ups on file.SABRA She was informed of the importance of frequent follow up visits to maximize her success with intensive lifestyle modifications for her multiple health conditions.   ATTESTASTION STATEMENTS:  Reviewed by clinician on day of visit: allergies, medications, problem list, medical history, surgical history, family history, social  history, and previous encounter notes.   I personally spent a total of 41 minutes in the care of the patient today including preparing to see the patient, getting/reviewing separately obtained history, performing a medically appropriate exam/evaluation, counseling and educating, and documenting clinical information in the EHR.   Lonell Liverpool ANP-C "

## 2024-03-03 ENCOUNTER — Other Ambulatory Visit: Payer: Self-pay | Admitting: Nurse Practitioner

## 2024-03-03 DIAGNOSIS — F418 Other specified anxiety disorders: Secondary | ICD-10-CM

## 2024-03-06 NOTE — Telephone Encounter (Signed)
 Med refill request: alprazolam  (xanax ) 0.25 mg tablet Last AEX: 06/03/22 TW Next AEX: 08/22/24 TW Last MMG (if hormonal med) N/A Refill authorized: Please Advise? Last Rx sent #[phone] with zero refills on 11/21/23 - TW

## 2024-03-08 LAB — HM MAMMOGRAPHY

## 2024-03-09 ENCOUNTER — Telehealth: Payer: Self-pay

## 2024-03-09 ENCOUNTER — Encounter: Payer: Self-pay | Admitting: Internal Medicine

## 2024-03-09 NOTE — Telephone Encounter (Signed)
 Pt left a voicemail stating that she could not remember if NP advised her to request refills of Alprazolam  from her PCP, or if NP would be sending those refills in for her.  Pt was last seen on 08/18/23.  Called Pt, no answer, LVM. Informed Pt that NP is OOO until next week. LVM advising Pt to follow up with her PCP, if she is completely out and cannot wait for a response from NP when she returns next week.

## 2024-03-29 ENCOUNTER — Ambulatory Visit (INDEPENDENT_AMBULATORY_CARE_PROVIDER_SITE_OTHER): Admitting: Nurse Practitioner

## 2024-08-22 ENCOUNTER — Encounter: Admitting: Nurse Practitioner
